# Patient Record
Sex: Male | Born: 1935 | Race: Black or African American | Hispanic: No | Marital: Married | Smoking: Former smoker
Health system: Southern US, Community
[De-identification: ages and names within clinical notes are randomized; demographics above are authoritative.]

## PROBLEM LIST (undated history)

## (undated) DIAGNOSIS — I739 Peripheral vascular disease, unspecified: Secondary | ICD-10-CM

## (undated) DIAGNOSIS — K529 Noninfective gastroenteritis and colitis, unspecified: Secondary | ICD-10-CM

## (undated) DIAGNOSIS — S98112A Complete traumatic amputation of left great toe, initial encounter: Secondary | ICD-10-CM

## (undated) DIAGNOSIS — E785 Hyperlipidemia, unspecified: Secondary | ICD-10-CM

## (undated) DIAGNOSIS — N400 Enlarged prostate without lower urinary tract symptoms: Secondary | ICD-10-CM

## (undated) DIAGNOSIS — R3915 Urgency of urination: Secondary | ICD-10-CM

## (undated) DIAGNOSIS — Z8619 Personal history of other infectious and parasitic diseases: Secondary | ICD-10-CM

## (undated) DIAGNOSIS — M1990 Unspecified osteoarthritis, unspecified site: Secondary | ICD-10-CM

## (undated) DIAGNOSIS — I2510 Atherosclerotic heart disease of native coronary artery without angina pectoris: Secondary | ICD-10-CM

## (undated) DIAGNOSIS — G4700 Insomnia, unspecified: Secondary | ICD-10-CM

## (undated) DIAGNOSIS — Z860100 Personal history of colon polyps, unspecified: Secondary | ICD-10-CM

## (undated) DIAGNOSIS — K2970 Gastritis, unspecified, without bleeding: Secondary | ICD-10-CM

## (undated) DIAGNOSIS — F99 Mental disorder, not otherwise specified: Secondary | ICD-10-CM

## (undated) DIAGNOSIS — I10 Essential (primary) hypertension: Secondary | ICD-10-CM

## (undated) DIAGNOSIS — R002 Palpitations: Secondary | ICD-10-CM

## (undated) DIAGNOSIS — H5440 Blindness, one eye, unspecified eye: Secondary | ICD-10-CM

## (undated) DIAGNOSIS — R233 Spontaneous ecchymoses: Secondary | ICD-10-CM

## (undated) DIAGNOSIS — R238 Other skin changes: Secondary | ICD-10-CM

## (undated) DIAGNOSIS — R011 Cardiac murmur, unspecified: Secondary | ICD-10-CM

## (undated) HISTORY — DX: Palpitations: R00.2

## (undated) HISTORY — PX: CARDIAC CATHETERIZATION: SHX172

## (undated) HISTORY — DX: Hyperlipidemia, unspecified: E78.5

## (undated) HISTORY — PX: COLONOSCOPY: SHX174

## (undated) HISTORY — DX: Essential (primary) hypertension: I10

## (undated) HISTORY — PX: CORONARY ARTERY BYPASS GRAFT: SHX141

## (undated) HISTORY — DX: Peripheral vascular disease, unspecified: I73.9

## (undated) HISTORY — DX: Unspecified osteoarthritis, unspecified site: M19.90

## (undated) HISTORY — PX: CORONARY ANGIOPLASTY: SHX604

## (undated) HISTORY — DX: Gastritis, unspecified, without bleeding: K29.70

## (undated) HISTORY — PX: CATARACT EXTRACTION: SUR2

## (undated) HISTORY — DX: Complete traumatic amputation of left great toe, initial encounter: S98.112A

## (undated) HISTORY — DX: Benign prostatic hyperplasia without lower urinary tract symptoms: N40.0

## (undated) HISTORY — DX: Atherosclerotic heart disease of native coronary artery without angina pectoris: I25.10

---

## 1958-02-24 HISTORY — PX: TONSILLECTOMY: SUR1361

## 1958-10-26 HISTORY — PX: BAND HEMORRHOIDECTOMY: SHX1213

## 1997-10-10 ENCOUNTER — Ambulatory Visit: Admission: RE | Admit: 1997-10-10 | Discharge: 1997-10-10 | Payer: Self-pay | Admitting: Cardiovascular Disease

## 1998-01-03 ENCOUNTER — Ambulatory Visit (HOSPITAL_COMMUNITY): Admission: RE | Admit: 1998-01-03 | Discharge: 1998-01-03 | Payer: Self-pay | Admitting: *Deleted

## 1998-02-24 HISTORY — PX: CORONARY ANGIOPLASTY WITH STENT PLACEMENT: SHX49

## 1998-08-20 ENCOUNTER — Observation Stay (HOSPITAL_COMMUNITY): Admission: AD | Admit: 1998-08-20 | Discharge: 1998-08-21 | Payer: Self-pay | Admitting: Cardiology

## 1999-04-09 ENCOUNTER — Observation Stay (HOSPITAL_COMMUNITY): Admission: RE | Admit: 1999-04-09 | Discharge: 1999-04-10 | Payer: Self-pay | Admitting: Cardiovascular Disease

## 1999-04-09 ENCOUNTER — Encounter: Payer: Self-pay | Admitting: Cardiovascular Disease

## 1999-07-05 ENCOUNTER — Ambulatory Visit (HOSPITAL_COMMUNITY): Admission: RE | Admit: 1999-07-05 | Discharge: 1999-07-05 | Payer: Self-pay | Admitting: *Deleted

## 1999-07-05 ENCOUNTER — Encounter (INDEPENDENT_AMBULATORY_CARE_PROVIDER_SITE_OTHER): Payer: Self-pay | Admitting: *Deleted

## 2000-04-16 ENCOUNTER — Encounter: Admission: RE | Admit: 2000-04-16 | Discharge: 2000-05-26 | Payer: Self-pay | Admitting: Orthopaedic Surgery

## 2000-06-10 ENCOUNTER — Encounter: Payer: Self-pay | Admitting: *Deleted

## 2000-06-10 ENCOUNTER — Ambulatory Visit (HOSPITAL_COMMUNITY): Admission: RE | Admit: 2000-06-10 | Discharge: 2000-06-10 | Payer: Self-pay | Admitting: *Deleted

## 2000-06-15 ENCOUNTER — Encounter: Admission: RE | Admit: 2000-06-15 | Discharge: 2000-06-15 | Payer: Self-pay | Admitting: *Deleted

## 2000-06-15 ENCOUNTER — Encounter: Payer: Self-pay | Admitting: *Deleted

## 2000-06-19 ENCOUNTER — Ambulatory Visit (HOSPITAL_COMMUNITY): Admission: RE | Admit: 2000-06-19 | Discharge: 2000-06-19 | Payer: Self-pay | Admitting: *Deleted

## 2000-06-19 ENCOUNTER — Encounter: Payer: Self-pay | Admitting: *Deleted

## 2000-06-26 ENCOUNTER — Ambulatory Visit (HOSPITAL_COMMUNITY): Admission: RE | Admit: 2000-06-26 | Discharge: 2000-06-26 | Payer: Self-pay | Admitting: *Deleted

## 2000-07-21 ENCOUNTER — Encounter: Payer: Self-pay | Admitting: Cardiology

## 2000-07-21 ENCOUNTER — Ambulatory Visit (HOSPITAL_COMMUNITY): Admission: RE | Admit: 2000-07-21 | Discharge: 2000-07-22 | Payer: Self-pay | Admitting: Cardiology

## 2001-01-14 ENCOUNTER — Ambulatory Visit (HOSPITAL_COMMUNITY): Admission: RE | Admit: 2001-01-14 | Discharge: 2001-01-14 | Payer: Self-pay | Admitting: *Deleted

## 2001-09-24 HISTORY — PX: OSTECTOMY: SHX1017

## 2001-10-13 ENCOUNTER — Encounter: Payer: Self-pay | Admitting: Orthopedic Surgery

## 2001-10-14 ENCOUNTER — Ambulatory Visit (HOSPITAL_COMMUNITY): Admission: RE | Admit: 2001-10-14 | Discharge: 2001-10-16 | Payer: Self-pay | Admitting: Orthopedic Surgery

## 2001-12-20 ENCOUNTER — Ambulatory Visit (HOSPITAL_COMMUNITY): Admission: RE | Admit: 2001-12-20 | Discharge: 2001-12-20 | Payer: Self-pay | Admitting: *Deleted

## 2002-07-14 ENCOUNTER — Ambulatory Visit (HOSPITAL_COMMUNITY): Admission: RE | Admit: 2002-07-14 | Discharge: 2002-07-14 | Payer: Self-pay | Admitting: *Deleted

## 2002-07-14 ENCOUNTER — Encounter: Payer: Self-pay | Admitting: *Deleted

## 2003-05-29 ENCOUNTER — Ambulatory Visit (HOSPITAL_COMMUNITY): Admission: RE | Admit: 2003-05-29 | Discharge: 2003-05-29 | Payer: Self-pay | Admitting: Cardiology

## 2003-06-25 HISTORY — PX: AORTIC VALVE REPLACEMENT: SHX41

## 2003-06-25 HISTORY — PX: CARDIAC VALVE REPLACEMENT: SHX585

## 2003-07-19 ENCOUNTER — Encounter (INDEPENDENT_AMBULATORY_CARE_PROVIDER_SITE_OTHER): Payer: Self-pay | Admitting: Specialist

## 2003-07-19 ENCOUNTER — Inpatient Hospital Stay (HOSPITAL_COMMUNITY): Admission: RE | Admit: 2003-07-19 | Discharge: 2003-07-24 | Payer: Self-pay | Admitting: Surgery

## 2003-08-08 ENCOUNTER — Encounter: Admission: RE | Admit: 2003-08-08 | Discharge: 2003-08-08 | Payer: Self-pay | Admitting: Surgery

## 2003-08-14 ENCOUNTER — Encounter (HOSPITAL_COMMUNITY): Admission: RE | Admit: 2003-08-14 | Discharge: 2003-11-12 | Payer: Self-pay | Admitting: Cardiology

## 2004-02-22 ENCOUNTER — Ambulatory Visit (HOSPITAL_BASED_OUTPATIENT_CLINIC_OR_DEPARTMENT_OTHER): Admission: RE | Admit: 2004-02-22 | Discharge: 2004-02-22 | Payer: Self-pay | Admitting: Cardiology

## 2004-02-22 ENCOUNTER — Ambulatory Visit: Payer: Self-pay | Admitting: Internal Medicine

## 2004-02-29 ENCOUNTER — Encounter: Admission: RE | Admit: 2004-02-29 | Discharge: 2004-02-29 | Payer: Self-pay | Admitting: Cardiovascular Disease

## 2004-03-07 ENCOUNTER — Ambulatory Visit (HOSPITAL_COMMUNITY): Admission: RE | Admit: 2004-03-07 | Discharge: 2004-03-08 | Payer: Self-pay | Admitting: Cardiovascular Disease

## 2004-05-01 ENCOUNTER — Ambulatory Visit (HOSPITAL_COMMUNITY): Admission: RE | Admit: 2004-05-01 | Discharge: 2004-05-01 | Payer: Self-pay | Admitting: *Deleted

## 2004-05-01 ENCOUNTER — Encounter (INDEPENDENT_AMBULATORY_CARE_PROVIDER_SITE_OTHER): Payer: Self-pay | Admitting: Specialist

## 2006-05-02 ENCOUNTER — Emergency Department (HOSPITAL_COMMUNITY): Admission: EM | Admit: 2006-05-02 | Discharge: 2006-05-03 | Payer: Self-pay | Admitting: *Deleted

## 2006-12-28 ENCOUNTER — Encounter: Admission: RE | Admit: 2006-12-28 | Discharge: 2006-12-28 | Payer: Self-pay | Admitting: Cardiovascular Disease

## 2007-01-04 ENCOUNTER — Ambulatory Visit (HOSPITAL_COMMUNITY): Admission: RE | Admit: 2007-01-04 | Discharge: 2007-01-04 | Payer: Self-pay | Admitting: Physician Assistant

## 2007-02-17 ENCOUNTER — Encounter: Admission: RE | Admit: 2007-02-17 | Discharge: 2007-02-17 | Payer: Self-pay | Admitting: Gastroenterology

## 2007-03-01 ENCOUNTER — Ambulatory Visit (HOSPITAL_COMMUNITY): Admission: RE | Admit: 2007-03-01 | Discharge: 2007-03-01 | Payer: Self-pay | Admitting: Cardiovascular Disease

## 2007-03-09 ENCOUNTER — Ambulatory Visit (HOSPITAL_COMMUNITY): Admission: RE | Admit: 2007-03-09 | Discharge: 2007-03-09 | Payer: Self-pay | Admitting: Cardiovascular Disease

## 2007-03-26 ENCOUNTER — Encounter: Admission: RE | Admit: 2007-03-26 | Discharge: 2007-03-26 | Payer: Self-pay | Admitting: Gastroenterology

## 2007-03-30 ENCOUNTER — Ambulatory Visit (HOSPITAL_COMMUNITY): Admission: RE | Admit: 2007-03-30 | Discharge: 2007-03-30 | Payer: Self-pay | Admitting: Gastroenterology

## 2007-08-21 ENCOUNTER — Emergency Department (HOSPITAL_COMMUNITY): Admission: EM | Admit: 2007-08-21 | Discharge: 2007-08-21 | Payer: Self-pay | Admitting: Family Medicine

## 2008-08-30 ENCOUNTER — Ambulatory Visit: Payer: Self-pay | Admitting: Vascular Surgery

## 2008-09-13 ENCOUNTER — Encounter: Admission: RE | Admit: 2008-09-13 | Discharge: 2008-09-13 | Payer: Self-pay | Admitting: Vascular Surgery

## 2008-09-13 ENCOUNTER — Ambulatory Visit: Payer: Self-pay | Admitting: Vascular Surgery

## 2009-03-20 ENCOUNTER — Ambulatory Visit: Payer: Self-pay | Admitting: Vascular Surgery

## 2009-03-21 ENCOUNTER — Ambulatory Visit: Payer: Self-pay | Admitting: Vascular Surgery

## 2009-10-25 ENCOUNTER — Ambulatory Visit: Payer: Self-pay | Admitting: Vascular Surgery

## 2010-05-09 ENCOUNTER — Ambulatory Visit (INDEPENDENT_AMBULATORY_CARE_PROVIDER_SITE_OTHER): Payer: Medicare Other | Admitting: Vascular Surgery

## 2010-05-09 ENCOUNTER — Encounter (INDEPENDENT_AMBULATORY_CARE_PROVIDER_SITE_OTHER): Payer: Medicare Other

## 2010-05-09 DIAGNOSIS — I739 Peripheral vascular disease, unspecified: Secondary | ICD-10-CM

## 2010-05-09 DIAGNOSIS — I70219 Atherosclerosis of native arteries of extremities with intermittent claudication, unspecified extremity: Secondary | ICD-10-CM

## 2010-05-10 NOTE — Assessment & Plan Note (Signed)
OFFICE VISIT  [name], [name] DOB:  [birth year]                                       05/09/2010 CHART#:[id number]  HISTORY OF PRESENT ILLNESS:  The patient today returns for followup.  He is an 77 year old male with lower extremity claudication symptoms.  He was last seen in September of 2011.  He states that currently he is able to walk approximately 50-75 yards before experiencing left-sided calf pain.  He is also unstable on his legs at that point and usually uses a shopping cart to support himself if he is going to be standing for long periods of time.  He continues to deny any rest pain.  He has not had any problems healing up ulcerations on his feet.  Overall he reports that he is in fairly good health.  CHRONIC MEDICAL PROBLEMS:  Include diabetes, hypertension and elevated cholesterol, all of these are currently controlled.  He is followed by Dr. Garnette Scheuermann for his cardiology issues related to underlying coronary disease.  He has previously had lower extremity vein harvesting from coronary artery bypass grafting.  Of note, he also previously has been intolerant to Plavix because this causes GI bleeding and this was a good portion of the reason why he was not interested in considering lower extremity stenting in the past that this would most likely require him to be on Plavix for at least a short period of time.  REVIEW OF SYSTEMS:  Today he denies any shortness of breath or chest pain.  PHYSICAL EXAM:  Vital signs:  Blood pressure is 117/55 in the left arm, heart rate is 83 and regular, oxygen saturation 97%, respirations 16. HEENT:  Unremarkable.  Neck:  Has 2+ carotid pulses without bruit. Chest:  Clear to auscultation.  Cardiac:  Exam is regular rate and rhythm without murmur.  Abdomen:  Soft, nontender, nondistended.  No masses.  Extremities:  He has nonpalpable femoral, popliteal and pedal pulses bilaterally.  Neurological:  He has symmetric  upper extremity and lower extremity motor strength.  Skin:  Has no open ulcers or rashes.  I had a lengthy discussion with the patient today and he seems at this point to be fairly frustrated with his current walking distance.  I did discuss with him the possibility of angioplasty and stenting in his lower extremities but did also discuss with him that this probably would require Plavix at least for a short period of time and he was not interested in taking Plavix at all.  The other option would be the possibility of doing a lower extremity bypass procedure to improve his symptoms.  However, he does have limited or minimal autologous conduit. At this point he has agreed to a diagnostic arteriogram to see what his options may be.  If he decides he wishes to have a bypass performed we will have him reviewed again by Dr. Katrinka Blazing preoperatively for risk stratification.  His arteriogram is scheduled for 05/24/2010.  Risks, benefits, possible complications and procedure details including but not limited to bleeding, infection, contrast reaction were explained to the patient today.  He understands and agrees to proceed.    Janetta Hora. Fields, MD Electronically Signed  CEF/MEDQ  D:  05/10/2010  T:  05/10/2010  Job:  [id number]

## 2010-05-24 ENCOUNTER — Ambulatory Visit (HOSPITAL_COMMUNITY): Payer: Medicare Other

## 2010-05-24 ENCOUNTER — Ambulatory Visit (HOSPITAL_COMMUNITY)
Admission: RE | Admit: 2010-05-24 | Discharge: 2010-05-24 | Disposition: A | Discharge status: Home / Self Care | Payer: Medicare Other | Source: Ambulatory Visit | Attending: Vascular Surgery | Admitting: Vascular Surgery

## 2010-05-24 DIAGNOSIS — I739 Peripheral vascular disease, unspecified: Secondary | ICD-10-CM | POA: Insufficient documentation

## 2010-05-24 DIAGNOSIS — I70219 Atherosclerosis of native arteries of extremities with intermittent claudication, unspecified extremity: Secondary | ICD-10-CM

## 2010-05-24 DIAGNOSIS — Z01810 Encounter for preprocedural cardiovascular examination: Secondary | ICD-10-CM | POA: Insufficient documentation

## 2010-05-24 LAB — POCT I-STAT, CHEM 8
Calcium, Ion: 1.14 mmol/L (ref 1.12–1.32)
Chloride: 109 mEq/L (ref 96–112)
Hemoglobin: 12.9 g/dL — ABNORMAL LOW (ref 13.0–17.0)
TCO2: 25 mmol/L (ref 0–100)

## 2010-05-24 LAB — GLUCOSE, CAPILLARY: Glucose-Capillary: 104 mg/dL — ABNORMAL HIGH (ref 70–99)

## 2010-06-03 NOTE — Op Note (Signed)
  [name], [name]                ACCOUNT NO.:  [id number]  MEDICAL RECORD NO.:  [id number]           PATIENT TYPE:  O  LOCATION:  [facility]SC                         FACILITY:  [facility]  PHYSICIAN:  Mark Hora. Janelys Glassner, MD  DATE OF BIRTH:  [birth year]  DATE OF PROCEDURE:  05/24/2010 DATE OF DISCHARGE:                              OPERATIVE REPORT   PROCEDURE:  Attempted aortogram via left and right femoral puncture.  PREOPERATIVE DIAGNOSIS:  Claudication, bilateral lower extremities.  POSTOPERATIVE DIAGNOSIS:  Claudication, bilateral lower extremities.  ANESTHESIA:  Local.  OPERATIVE DETAILS:  After obtaining informed consent, the patient was taken to the [facility] Lab.  The patient was placed in supine position on the angio table.  Both groins were prepped and draped in the usual sterile fashion.  Local anesthesia was infiltrated over the area of the right common femoral artery.  An introducer needle was then used to cannulate the right common femoral artery.  There was some backbleeding from this, however, it was fairly diminutive.  An attempt was made to pass a 0.035 Versacore wire into the needle, but this would not advance into the artery.  This was observed under fluoroscopy and the femoral artery was heavily calcified at the femoral bifurcation, SFA, profunda femoris, and common femoral artery.  Ultrasound was used to identify the right common femoral artery and there seemed to be a channel lumen within this, so additional attempt was made to cannulate this under ultrasound guidance and again this was unsuccessful.  At this point, attention was turned to the patient's left groin.  Local anesthesia was infiltrated over the left common femoral artery.  There was a pulse within the artery using ultrasound guidance.  There also appeared to be a channel lumen within this.  However, the artery was also heavily calcified.  Introducer needle was then passed several times to try to cannulate the  left common femoral artery.  There again was some backbleeding from this, but no pulsatile flow.  The guidewire could not be advanced.  At this point, attempts to cannulate the groin vessels were abandoned.  The patient will be scheduled for a CT angiogram later today to further define his aortoiliac system.  The patient tolerated the procedure well and there were no complications.  Hemostasis was obtained with direct pressure on both groins for several minutes.  The patient was taken to the holding area in stable condition.  No contrast was administered.     Mark Hora. Hazelgrace Bonham, MD     CEF/MEDQ  D:  05/24/2010  T:  05/25/2010  Job:  [id number]  Electronically Signed by Fabienne Bruns MD on 06/03/2010 07:50:07 AM

## 2010-07-05 ENCOUNTER — Encounter: Payer: Self-pay | Admitting: Vascular Surgery

## 2010-07-09 NOTE — Cardiovascular Report (Signed)
NAMETHEORDORE, Mark NO.:  [id number]   MEDICAL RECORD NO.:  [id number]          PATIENT TYPE:  INP   LOCATION:  2854                         FACILITY:  [facility]   PHYSICIAN:  Nanetta Batty, M.D.   DATE OF BIRTH:  1924/06/29   DATE OF PROCEDURE:  DATE OF DISCHARGE:                            CARDIAC CATHETERIZATION   [name] is an 77 year old African American male with history of CAD  and PVOD.  He has a history of bypass grafting and aortic valve  replacement with a pericardial valve..  He has hypertension,  hyperlipidemia and PVOD, status post right common iliac artery  __________  and stenting by myself on March 07, 2004.  At that time he  was found to have a 40% left iliac artery lesion and totally occluded  SFA's bilaterally.  He presents now for angiography because of  progressive lifestyle-limiting claudication.   DESCRIPTION OF PROCEDURE:  Patient is brought to the second floor [facility] angiographic suite in the postabsorptive state.He was premedicated  for contrast allergy with Solu-Medrol, Benadryl and __________ Both  groins are prepped and shaved in usual sterile fashion.  One percent  Xylocaine was used for local anesthesia.  Attempts were made to access  his common femoral arteries bilaterally __________ needle under  fluoroscopic visual control because of dense calcification.  I could  feel a calcification with attempted puncture of the femoral arteries and  was able to get blood flow back bilaterally; however, was unable to pass  a wire through to the calcification.  I felt uncomfortable being  aggressive in this location given the extent of calcification and the  probable difficulty placing a sheath.  The procedure was terminated and  plans will be to observe his groins for 6 hours in the recumbent  position and send him home.  We will get a CT scan angiogram to define  his anatomy as an outpatient and I will review this make further  recommendations based on this.  I suspect that at this point, he is not  a percutaneous revascularization candidate unless this is done via the  brachial approach for iliac disease.  Pressure was held on each groin to  obtain hemostasis.  Patient was left the room in stable condition.      Nanetta Batty, M.D.  Electronically Signed     JB/MEDQ  D:  03/01/2007  T:  03/01/2007  Job:  [id number]   cc:   Saddie Benders, D.D.S.  Fleet Contras, M.D.

## 2010-07-09 NOTE — Procedures (Signed)
CAROTID DUPLEX EXAM   INDICATION:  Bilateral carotid bruit.   HISTORY:  Diabetes:  Borderline.  Cardiac:  Coronary artery bypass graft x5 in 1989, x2 in 2000.  Hypertension:  Borderline.  Smoking:  Remote history of tobacco use, quit when he was 15.  Previous Surgery:  No.  CV History:  No.  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  Amaurosis Fugax No, Paresthesias , Hemiparesis No                                       RIGHT             LEFT  Brachial systolic pressure:         164               158  Brachial Doppler waveforms:         Triphasic         Triphasic  Vertebral direction of flow:        Antegrade         Antegrade  DUPLEX VELOCITIES (cm/sec)  CCA peak systolic                   86                82  ECA peak systolic                   85                96  ICA peak systolic                   96                66  ICA end diastolic                   29                20  PLAQUE MORPHOLOGY:                  Calcified         Calcified  PLAQUE AMOUNT:                      Mild              Mild to moderate  PLAQUE LOCATION:                    Proximal ICA      CCA, proximal ICA   IMPRESSION:  20-39% ICA stenosis bilaterally.   ___________________________________________  Janetta Hora Fields, MD   MC/MEDQ  D:  09/13/2008  T:  09/13/2008  Job:  [id number]

## 2010-07-09 NOTE — Assessment & Plan Note (Signed)
OFFICE VISIT   [name], [name]  DOB:  10-12-1924                                       10/25/2009  CHART#:[id number]   HISTORY OF PRESENT ILLNESS:  The patient is an 77 year old male that I  have followed chronically for lower extremity claudication symptoms.  He  continues to be able to walk approximately 30 minutes before  experiencing symptoms in his legs.  He is able to walk for extended  periods of time if he uses a shopping cart for support.  He states that  his left leg is worse than his right.  He states that sometimes he is  only able to walk half a block without support.  He thinks that overall  his symptoms may be slightly worse than his last office visit which was  in January of 2011.  He continues to deny any rest pain.  He has no  ulcers on his feet.   Chronic medical problems include diabetes, hypertension, elevated  cholesterol, all of which are currently controlled.   PAST SURGICAL HISTORY:  Coronary artery bypass grafting requiring  saphenous vein from both legs.   SOCIAL HISTORY:  He is retired.  He is married.  He is a former smoker  but quit in 1938.  He does not consume alcohol regularly.   REVIEW OF SYSTEMS:  He denies any shortness of breath or chest pain.  Lower extremity vascular symptoms are as described above.   PHYSICAL EXAM:  Vital signs:  Blood pressure is 154/54 in the left arm,  heart rate is 80 and regular.  Temperature is 97.9.  HEENT:  Unremarkable.  Neck:  Has 2+ carotid pulses without bruit.  Chest:  Clear to auscultation.  Cardiac:  Regular rate and rhythm without  murmur.  Abdomen:  Soft, nontender, nondistended.  No masses.  Extremities:  He has absent femoral, popliteal and pedal pulses  bilaterally.  His feet are warm and pink with brisk capillary refill.  He has no open ulcers or rashes.   Review of his previous CT scans shows that he has a right common femoral  artery occlusion with reconstitution of the  profunda.  He also has some  mild to moderate in-stent stenosis of his left iliac stent.   I had a lengthy discussion with the patient today about his claudication  symptoms.  I did reassure him that he is currently not at risk of limb  loss.  His ABI on the left side was 0.41, the right side was 0.57.  These are similar to previous ABIs.  I did offer him the possibility of  an intervention for his left leg that would most likely be in the form  of a bypass.  However, I did discuss with him that this would most  likely require prosthetic with limited durability compared to vein.  He  understands and will currently think about whether or not he wishes to  have an intervention in his lower extremities.  He will call if he  wishes to have something done in the near future.  Otherwise he will  follow up in six months' time with repeat ABIs.     Janetta Hora. Fields, MD  Electronically Signed   CEF/MEDQ  D:  10/25/2009  T:  10/26/2009  Job:  [id number]   cc:   Lindaann Slough, M.D.  Fleet Contras, M.D.  Lyn Records, M.D.

## 2010-07-09 NOTE — Assessment & Plan Note (Signed)
OFFICE VISIT   [name], [name]  DOB:  26-Sep-1924                                       09/13/2008  CHART#:[id number]   The patient returns for followup today.  He had a CT angiogram earlier  today as well as a carotid duplex exam and returns today to discuss the  results of this.  He was last seen on 08/30/2008.  At that time he was  complaining of claudication symptoms occurring at one half to one block  with the left leg being worse than the right.  He continues to state  similar symptoms.  He denies any rest pain.   PHYSICAL EXAMINATION:  On physical exam today the feet are pink, warm  and well-perfused.  He has no significant change overall.  Blood  pressure was 147/68, pulse 81 and regular.   CT angiogram shows several areas of atherosclerotic occlusive disease.  He has evidence of possible renal artery stenosis but his blood pressure  is well-controlled and his creatinine is normal.  I do not think this  needs to be addressed currently.   As far as his lower extremity occlusive disease is concerned he has a  right common iliac stent with mild in-stent restenosis.  He has a  chronic occlusion of his right common femoral and superficial femoral  artery.  He has reconstitution of his right below knee popliteal artery  with two vessel runoff to the right foot via the peroneal and posterior  tibial artery.   On his left leg the left iliac artery is patent.  The left superficial  femoral artery is occluded with reconstitution of the below knee  popliteal artery and two vessel runoff via the posterior tibial and  peroneal arteries.  His most recent ABIs were 0.43 on the left and 0.57  on the right.   His carotid duplex exam today showed less than 40% stenosis bilaterally.   I had a lengthy discussion with the patient today concerning his  occlusive disease in both lower extremities.  His left leg is the most  symptomatic.  However, he does not  currently have rest pain or tissue  loss.  To reconstruct his left lower extremity this would most likely  require a left femoral to below knee popliteal bypass and possible left  femoral endarterectomy.  To reconstruct his right side he most likely  would require left to right femoral-femoral bypass in addition to a  femoral-popliteal bypass and a right femoral endarterectomy.  In  discussing this with the patient he has decided at this point that his  symptoms are only mildly disabling and he will continue a walking  program and his Pletal for now.  If his symptoms become more severe or  he develops rest pain or tissue loss we would consider revascularization  at that time.  He will follow up with repeat ABIs in 6 months.   Janetta Hora. Fields, MD  Electronically Signed   CEF/MEDQ  D:  09/14/2008  T:  09/14/2008  Job:  [id number]   cc:   Fleet Contras, M.D.  Lindaann Slough, M.D.  Madaline Savage, M.D.

## 2010-07-09 NOTE — Assessment & Plan Note (Signed)
OFFICE VISIT   [name], [name]  DOB:  07/11/1924                                       08/30/2008  CHART#:[id number]   The patient is an 77 year old male referred by Dr. Brunilda Payor for evaluation  of lower extremity claudication symptoms.  He previously had a right  common iliac artery stent placed by Dr. Allyson Sabal in 2008.  This was done  for claudication symptoms.  He states that his symptoms were improved  somewhat from that but he still has claudication symptoms that occur at  one half to one block.  Left leg worse than the right.  He also  complains of burning in his feet when he wears tight shoes.  He has some  relief in both lower extremities with wearing compression stockings.   Risk factors include age, coronary artery disease, diabetes,  hypertension and elevated cholesterol.   PAST SURGICAL HISTORY:  Coronary bypass grafting and has also had PTCA  with stenting.  He also has had tonsillectomy.   MEDICATIONS:  1. Lescol XL 80 mg q.h.s.  2. Metoprolol ER 25 mg 1 tablet three times a day.  3. Cilostazol 100 mg twice a day.  4. Zetia 10 mg once a day.  5. Ramipril 5 mg one a day.  6. Aspirin 325 mg once a day.  7. Tamsulosin 0.4 mg q.h.s.  8. Zolpidem 10 mg q.h.s.   ALLERGIES:  He has no known drug allergies.   FAMILY HISTORY:  Unremarkable.   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  He is a nonsmoker but did smoke for a few years when he  was a teenager.   REVIEW OF SYSTEMS:  CONSTITUTIONAL:  He is 5 feet 5 inches.  CARDIAC:  He has a history of palpitations and atrial fibrillation which  is followed by Dr. Elsie Lincoln.  ORTHOPEDIC:  He has multiple joint arthritis and muscle pain.  Pulmonary, GI, renal, vascular, neurologic, psychiatric, ENT and  hematologic review of systems otherwise negative.   PHYSICAL EXAM:  Vital signs:  Blood pressure is 139/67 in the left arm,  pulse is 73 and regular.  HEENT:   Unremarkable.  Neck:  Has bilateral  carotid bruits and 2+ carotid pulses.  Chest:  Clear to auscultation.  Cardiac:  Exam is regular rate and rhythm with a 3/6 systolic murmur.  Abdomen:  Soft, nontender with no pulsatile mass.  Extremities:  He has  2+ brachial and radial pulses bilaterally.  He has 1+ femoral pulses  bilaterally.  He has absent popliteal and pedal pulses bilaterally.  He  has no edema or ulcerations in the lower extremities.   He had bilateral ABIs performed on 08/30/2008 which were 0.57 on the  right and 0.43 on the left.   I had a lengthy discussion with the patient today regarding possible  treatment options of his lower extremity claudication symptoms.  Most  likely he has bilateral superficial femoral artery occlusive disease  which was previously documented by Dr. Allyson Sabal.  I do not believe that he  has had significant recurrence of his iliac artery stenosis.  Treatment  options currently would include conservative measures with walking and  continued medical management with Pletal and risk factor modification.  Other potential treatment plan would be arteriogram with possible  angioplasty  and stenting of his superficial femoral arteries.  However,  the SFA occlusions described previously seem fairly lengthy and probably  would not be amendable to percutaneous revascularization.  Other  possibilities would be an open revascularization procedure.  He is  currently not interested in an open operation as he does not feel like  his symptoms are debilitating enough to consider the risks associated  with an open operation.  I believe the best option for the patient at  this point would be a CT angiogram of lower extremity runoff so that we  can further define his arterial tree to see what other treatment options  might be best for him and also that we have a baseline exam.  In  addition, we will obtain a carotid duplex exam since he does have  bilateral carotid bruits  and has not had a carotid ultrasound in several  years.  He will follow up with me in 2 weeks' time to discuss further  those treatment options.   [name]. Fields, MD  Electronically Signed   CEF/MEDQ  D:  08/30/2008  T:  08/31/2008  Job:  [id number]   cc:   Fleet Contras, M.D.  Lindaann Slough, M.D.  Madaline Savage, M.D.

## 2010-07-09 NOTE — Assessment & Plan Note (Signed)
OFFICE VISIT   [name], [name]  DOB:  03-May-1924                                       03/21/2009  CHART#:[id number]   The patient returns for followup today.  He was last seen in July of  2010 with complaints of lower extremity claudication.  At that time he  was walking 1/2 block before experiencing claudication symptoms in his  left leg.  He denied any rest pain at that time.  He returned to the  office today after repeat ABIs yesterday showed no flow to his left  lower extremity.   However, clinically he is basically the same as his July office visit.  He continues to ride a stationary bike 1-1/2 to 2 hours per day.  He  walks 1-1/2 miles at [place] with the support of a shopping cart.  Overall he feels well and has had no significant change in his clinical  condition since July of 2010.  He denies any rest pain.  He has had no  problems with nonhealing ulcers on his feet.   REVIEW OF SYSTEMS:  He denies any chest pain or shortness of breath.   MEDICATIONS:  Are unchanged.  He has changed cardiologists to Dr. Garnette Scheuermann since the retirement of Dr. Elsie Lincoln.   PHYSICAL EXAM:  Blood pressure is 133/62 in the left arm, heart rate is  75 and regular.  Temperature is 97.8.  Lower extremity exam shows absent  femoral, popliteal and pedal pulses bilaterally.  Feet are pink, warm  and well-perfused with no ulcerations.   We repeated his ABIs today and most likely this was lab error yesterday.  The ABI on the left side was 0 yesterday.  Today it is 0.41 which is  similar to what it was in July.  ABI on the right side was 0.46 which is  also similar to July.   Overall the patient's clinical condition is unchanged.  Again, he has no  saphenous vein due to previous harvest and to do a bypass for  claudication symptoms alone I do not believe would be in his best  interest at this time.  He agrees and feels that he is not really  debilitated by his current  symptoms.  He will continue to walk and ride  his bike daily.  If his symptoms become worse over time or become more  disabling he will return for followup sooner.  Otherwise we will see him  again in six months' time for repeat ABIs.     Janetta Hora. Fields, MD  Electronically Signed   CEF/MEDQ  D:  03/21/2009  T:  03/22/2009  Job:  [id number]   cc:   Lyn Records, M.D.  Fleet Contras, M.D.  Lindaann Slough, M.D.

## 2010-07-12 NOTE — Op Note (Signed)
NAME:  [name], [name]                          ACCOUNT NO.:  [id number]   MEDICAL RECORD NO.:  [id number]                   PATIENT TYPE:  INP   LOCATION:  2306                                 FACILITY:  [facility]   PHYSICIAN:  Evelene Croon, M.D.                  DATE OF BIRTH:  [birth year]   DATE OF PROCEDURE:  07/19/2003  DATE OF DISCHARGE:                                 OPERATIVE REPORT   PREOPERATIVE DIAGNOSIS:  Severe native three-vessel coronary artery disease  and saphenous vein graft disease, status post coronary artery bypass graft  surgery in 1989.  Severe aortic stenosis.   POSTOPERATIVE DIAGNOSIS:  Severe native three-vessel coronary artery disease  and saphenous vein graft disease, status post coronary artery bypass graft  surgery in 1989.  Severe aortic stenosis.   OPERATION PERFORMED:  Redo median sternotomy, extracorporeal circulation,  redo coronary artery bypass grafting surgery times one using a saphenous  vein graft to the posterior descending coronary artery, and aortic valve  replacement using a 21 mm pericardial valve.  Endoscopic vein harvesting  from the right leg.   SURGEON:  Alleen Borne, M.D.   ASSISTANT:  Jerold Coombe, P.A.   ANESTHESIA:  General endotracheal.   INDICATIONS FOR PROCEDURE:  The patient is a 77 year old gentleman with a  history of coronary disease who underwent coronary artery bypass surgery  times five in 1989 by Dr. Andrey Campanile.  He had a left internal mammary artery  graft to the left anterior descending and a sequential saphenous vein graft  to the intermediate and first and second marginal branches.  He has also had  a saphenous vein graft to the posterior descending artery.  He subsequently  had two stents placed in his right coronary artery and a stent placed in the  midbody of the sequential left circumflex vein graft.  His last percutaneous  intervention on the left circumflex was in May of 2002.  He underwent a  Cardiolite  scan in March of 2005 which showed a moderate-sized inferior and  septal nontransmural myocardial infarction with a moderate amount of  reversible peri-infarct ischemia.  His ejection fraction was 44%.  He had a  several month history of progressive shortness of breath with exertion as  well as mild chest discomfort with exertion.  Cardiac catheterization on  May 29, 2003 was initially performed through the right femoral approach but  was not possible to pass the wire up the right iliac system and therefore  the procedure was performed through the left femoral artery which was  patent.  His right and left heart pressures were within normal limits.  There was severe aortic stenosis with a peak gradient of 57 and a mean  gradient of 50 with a calculated valve area of 0.63 cm squared.  Cardiac  index was 2.6.  Coronary angiography showed severe calcification of the  coronary arteries.  The  left main was occluded at its ostium.  The right  coronary artery was occluded in the midportion of with the recannulized  segment of the vessel still in the distal right coronary artery by  collaterals.  The right coronary artery vein graft was occluded proximally  at its ostium.  A sequential vein graft to the left circumflex was widely  patent throughout including the stented segments.  The left internal mammary  artery graft to the LAD was widely patent.  Left ventricular ejection  fraction was 45 to 50% with a small inferior basal aneurysm.  An  echocardiogram showed normal left ventricular size and function with mild  concentric left ventricular hypertrophy.  The right ventricle was mildly  dilated with mildly reduced systolic function.  There was no mitral stenosis  and mild mitral regurgitation.  There was mild tricuspid regurgitation.  There was severe aortic stenosis with a calcified aortic valve and mild  aortic insufficiency.  After review of the angiogram and echocardiogram and  examination of  the patient, it was felt that the best treatment would be  aortic valve replacement and coronary artery bypass to the right coronary  artery.  I discussed the operative procedure with the patient and his family  including alternatives, benefits, and risks including bleeding, possible  blood transfusion, infection, stroke, myocardial infarction, organ failure  and death.  I also discussed the use of a pericardial tissue valve since he  is 77 years old.  They understood all of this and agreed to proceed.   DESCRIPTION OF PROCEDURE:  The patient was taken to the operating room and  placed on the table in supine position.  After induction of general  endotracheal anesthesia, a Foley catheter was placed in the bladder using  sterile technique.  Then the chest, abdomen and both lower extremities were  prepped and draped in the usual sterile manner.  The transesophageal  echocardiogram was performed.  This showed severe calcific aortic stenosis.  There was mild mitral regurgitation.  There was mild aortic insufficiency.  There was moderate left ventricular hypertrophy with good systolic function.  The right ventricle was mildly dilated and hypokinetic.   The chest was entered through a median sternotomy incision.  The sternum was  divided using an oscillating saw without difficulty.  Bone hooks were used  to retract the sternal edges and the pericardium was then dissected off the  back side of the sternum.  Then the chest retractor was placed.  The  pericardial adhesions were divided to expose the right atrium and ascending  aorta.  The old right coronary vein graft was identified.  The left  circumflex vein graft was identified and carefully preserved.  Then the  patient had a segment of greater saphenous vein harvested from the right leg  using endoscopic vein harvest technique.  This vein was of medium size and  fair quality.  Then the patient was heparinized and when an adequate activated  clotting  time was achieved, the distal ascending aorta was cannulated using a 20  French aortic cannula for arterial inflow.  Venous outflow was achieved  using a two-stage venous cannula for the right atrium.  An antegrade  cardioplegia and vent cannula was inserted in the aortic root.  A retrograde  cardioplegia cannula was inserted through the right atrium into the coronary  sinus.   The patient was placed on cardiopulmonary bypass and the inferior wall was  dissected from the adhesions to expose the posterior descending coronary  artery.  This artery was diffusely diseased but still graftable just beyond  the previous distal anastomosis.  The old right coronary vein graft was  divided at its origin from the aorta to allow exposure of the aortic root.  The distal end of it was oversewn.  Then dissection was performed to expose  the left internal mammary pedicle as it entered the pericardial cavity.  This was a large vessel.  Then the ascending aorta was completely exposed.  The patient was then cooled to 28 degrees centigrade.  Then the aorta was  crossclamped and 500 mL of cold blood antegrade cardioplegia was  administered in the aortic root with quick arrest of the heart.  A clamp was  placed on the mammary pedicle.  This was followed by 500 mL of retrograde  cold blood cardioplegia.  Topical hypothermia with iced saline was used.  Then the distal anastomosis was performed to the posterior descending  coronary artery.  The internal diameter was 1.6 mm.  The conduit used was a  segment of greater saphenous vein.  The anastomosis was performed in a end-  to-side manner using continuous 7-0 Prolene suture.  Flow was measured  through the graft and was excellent.  Then a dose of cardioplegia was given  down this vein graft.   Then attention was turned to aortic valve replacement.  The aorta was opened  transversely about 2 to 3 cm above the takeoff of the right coronary artery.  The  proximal portion of the aortic root below this area was markedly  calcified.  Examination of the native valve showed there was severe  calcification of the leaflets and the annulus.  The native aortic valve was  excised.  Then the annulus was decalcified using rongeurs.  Care was taken  to remove all particulate debris.  The left ventricle and aortic root were  irrigated with iced saline solution.  The annulus was sized and a 21 mm  pericardial Magna valve was chosen.  This had model number 3000, serial  number [id number] and was a 21 mm valve.  Then a series of pledgeted 2-0  Ethibond horizontal mattress sutures were placed around the annulus with the  pledgets in subannular position.  These sutures were placed through a sewing  ring, the valve lowered in place. The sutures were tied sequentially and the  valve seated nicely.  The right coronary ostia was not obstructed.  Then the patient was rewarmed to 37 degrees centigrade.  The aorta was  closed in two layers using continuous 4-0 Prolene suture.  The aortotomy was  coated with Bioglue since the aorta was somewhat thin.  Then the proximal  vein graft anastomosis was performed to the aortic root in an end-to-side  manner using continuous 6-0 Prolene suture.  Then deairing maneuvers were  performed.  The head was placed in a Trendelenburg position.  The clamp was  removed from the mammary pedicle and there was rapid warming of the  ventricular septum and return of spontaneous ventricular fibrillation.  The  cross-clamp was removed with a time of 136 minutes and the patient  spontaneously converted into sinus rhythm.   The proximal and distal anastomoses appeared hemostatic and the line of the  graft satisfactory.  Graft markers were placed around the proximal  anastomosis.  Two temporary right ventricular and right atrial pacing wires  placed and brought out through the skin.   When the patient had rewarmed to 37 degrees centigrade, he was  weaned from  cardiopulmonary bypass on low-dose dopamine.  Total bypass time was 192  minutes.  Cardiac function appeared excellent with a cardiac output of 6L  per minute.  Transesophageal echocardiogram confirmed normal functioning  aortic valve prosthesis without insufficiency or perivalvular leak.  There  was no change in the mild mitral regurgitation and no change in left or  right ventricular function.  Protamine was then given and the venous and  aortic cannulas were removed.  Hemostasis was achieved.  Two chest tubes  were placed with a tube in the posterior pericardium and one in the anterior  mediastinum.  The sternum was closed with #6 stainless steel wires.  The  fascia was closed with continuous #1 Vicryl suture.  The subcutaneous tissue  was closed with continuous 2-0 Vicryl and the skin with 3-0 Vicryl  subcuticular closure.  The lower extremity vein harvest site was closed in  layers in a similar manner.  Sponge, needle and instrument counts were  correct according to the scrub nurse.  Dry sterile dressings were applied  over the incisions and around the chest tubes which were hooked to Pleur-  evac suction.  The patient remained hemodynamically stable and was  transported to the SICU in guarded but stable condition.                                               Evelene Croon, M.D.    BB/MEDQ  D:  07/19/2003  T:  07/20/2003  Job:  [id number]   cc:   Madaline Savage, M.D.  [address], [address]  [place]  [place] [address]  Fax: [phone]   Cardiac cath lab

## 2010-07-12 NOTE — Discharge Summary (Signed)
NAME:  [name], [name]           ACCOUNT NO.:  [id number]   MEDICAL RECORD NO.:  [id number]          PATIENT TYPE:  OUT   LOCATION:  SDS                          FACILITY:  [facility]   PHYSICIAN:  Nanetta Batty, M.D.   DATE OF BIRTH:  May 05, 1924   DATE OF ADMISSION:  01/04/2007  DATE OF DISCHARGE:  01/04/2007                               DISCHARGE SUMMARY   This was a brief visit on January 04, 2007 in short-stay C.  Patient  was brought into the hospital for elective peripheral angiogram  secondary to claudication.  Patient complained of dark stools on arrival  to short-stay.  He had been started on Plavix two weeks prior.  We did a  Hemoccult which was positive for blood.  Repeat CBC dropped from 14 down  to 12.  I called his GI physician, Dr. Bosie Clos, and Dr. Bosie Clos had  him come to his office for endoscopy.  Patient was instructed to stop  his Plavix.  He was given a prescription for Protonix; he was on Zegerid  as an outpatient and Dr. Bosie Clos would decide which one to give the  patient as well.  [name] will see Dr. Allyson Sabal back to reschedule his PV  angio in two weeks; appointment was given.      Darcella Gasman. Annie Paras, N.P.      Nanetta Batty, M.D.  Electronically Signed    LRI/MEDQ  D:  01/09/2007  T:  01/09/2007  Job:  [id number]   cc:   Shirley Friar, MD

## 2010-07-12 NOTE — Op Note (Signed)
NAME:  [name], [name]                          ACCOUNT NO.:  [id number]   MEDICAL RECORD NO.:  [id number]                   PATIENT TYPE:  INP   LOCATION:  2306                                 FACILITY:  [facility]   PHYSICIAN:  Burna Forts, M.D.             DATE OF BIRTH:  1924/04/03   DATE OF PROCEDURE:  07/19/2003  DATE OF DISCHARGE:                                 OPERATIVE REPORT   PROCEDURE PERFORMED:  Intraoperative transesophageal echocardiogram.   INDICATIONS FOR PROCEDURE:  [name] is a 77 year old gentleman who  presents for reoperation, coronary artery bypass grafting and aortic valve  replacement to be performed by Evelene Croon, M.D.   DESCRIPTION OF PROCEDURE:  He was brought to the holding area the morning of  surgery where under local anesthesia with sedation, radial arterial and  pulmonary artery catheters were inserted.  He was then taken to the  operating room for routine induction of general anesthesia after which the  TEE probe was lubricated and passed oropharyngeally into the stomach in  preparation for imaging of the cardiac structures.   PRECARDIOPULMONARY BYPASS EXAMINATION:  Left ventricle.  This is a severely  concentrically hypertrophied left ventricular chamber.  Short axis views  revealed good overall contractility pattern both in short and long axis  views.  Papillary muscles are also thickened but well outlined.   Mitral valve.  Annular calcium is noted around the mitral valve leaflet  area.  The leaflets themselves are thin, compliant, mobile and normal in  form and function.  There is trace mitral regurgitant flow on Doppler  examination.   Aortic valve.  This is a severely thickened leaflet.  The echodensity is so  intense, it is difficult to tell whether this is a free or functionally  bicuspid leaflet valve.  This is consistent with severe aortic stenosis and  there is severe limitation of motion during systolic contraction associated  with nearly total immobility of the valve with a small orifice noted. On  Doppler examination in the long axis views, there is trace aortic  insufficiency and of course, a very stenotic jet about the level of the  aortic valve in the ascending aorta.   Left atrium.  This is a normal left atrial chamber.  The interatrial septum  was intact.   Right ventricle.  Essentially normal chamber in form and function.   Tricuspid valve.  Mild to moderate tricuspid regurgitation is noted.   Right atrium.  Mildly increased right atrial size.   The patient was placed on cardiopulmonary bypass and hypothermia was begun.  The aortic valve was replaced with a #21 tissue valve.  Coronary artery  bypass grafting was carried out times one.  The patient was rewarmed and  separated from cardiopulmonary bypass.   POST CARDIOPULMONARY BYPASS TEE EXAMINATION (LIMITED EXAM):  Left ventricle.  In the early bypass period, this was a paced ventricular chamber.  Overall  contractility remained good with the addition of inotropes.  There does not  appear to be any significant hypo or akinetic areas noted in the post bypass  period.   Aortic valve.  __________  disease, the aortic valve could now be seen.  The  trileaflet tissue valve was placed, appeared to be placed well, seated  appropriately, functioning entirely in a normal fashion.  There was no  obstruction to flow and there was essentially no aortic insufficiency noted.  This is considered to be a good repair.   The rest of the cardiac examination was as previously described.  The  patient was then returned to the cardiac intensive care unit in stable  condition.                                               Burna Forts, M.D.    JTM/MEDQ  D:  07/19/2003  T:  07/20/2003  Job:  [id number]

## 2010-07-12 NOTE — Op Note (Signed)
NAME:  [name], [name]                          ACCOUNT NO.:  [id number]   MEDICAL RECORD NO.:  [id number]                   PATIENT TYPE:  OIB   LOCATION:  2550                                 FACILITY:  [facility]   PHYSICIAN:  Deidre Ala, M.D.                 DATE OF BIRTH:  [birth year]   DATE OF PROCEDURE:  10/14/2001  DATE OF DISCHARGE:                                 OPERATIVE REPORT   PREOPERATIVE DIAGNOSES:  Chronic right trochanteric bursitis with prominent  greater trochanter, status post  exhaustive conservative therapy.   POSTOPERATIVE DIAGNOSES:  Chronic right trochanteric bursitis with prominent  greater trochanter, status post  exhaustive conservative therapy.   OPERATION PERFORMED:  1. Right hip greater trochanteric ostectomy.  2. Iliotibial band release.  3. Trochanteric bursectomy.   SURGEON:  Bradley Ferris, M.D.   ASSISTANT:  Anise Salvo. Clark, P.A.-C.   ANESTHESIA:  General endotracheal.   CULTURES:  None.   DRAINS:  None.   ESTIMATED BLOOD LOSS:  Less than 75 cc.   FLUIDS REPLACED:  Without.   PATHOLOGIC FINDINGS AND HISTORY:  [name] was referred to me as a 77 year old  male referred by Dr. Chanda Busing, an old patient of the practice, new  problem of six months' duration of right hip pain starting March 22, 2001.  It has been progressively worsening and it getting to the point where it is  curtailing his walking.  He could not walk longer than a quarter of a mile  in and about three to four minutes with sitting down thereafter and resting.  The pain was intermittent.  X-rays of the hip revealed some slight sclerosis  of acetabular margins, a little bit of spurring and some hypertrophy with  spurring of the greater trochanter and he was tender over the trochanter.  No signs of sciatica or intra-articular hip disease.  We injected him with  cortisone and Marcaine.  He had almost immediate improvement of symptoms.  We saw him back April 19, 2001.   His hip had improved, not tender  laterally.  We continued him on medications.  He also had some rectus  femoris tendinitis.  By May 17, 2001 he was still having some right hip  pain behind the trochanter, over the top and down the anterior thigh comes  with walking, and we did some stretching exercises.  By June 16, 2001 the  hip bursa was still painful.  We reinjected it with cortisone Marcaine.  By  July 30, 2001 he got a couple of weeks relief from the last injection and it  had returned as bad as it ever was.  At this point we felt that operative  intervention was indicated.  At surgery he had a very tight iliotibial band,  a thick trochanteric bursa and a very prominent lateral greater trochanter.  We did an iliotibial band release in cruciate configuration, excised  the  bursa and then incised the fascia over the trochanter and excised the sharp  trochanteric prominence with an osteotome and rongeur and smoothed with bone  wax.   With adequate anesthesia obtained, using endotracheal technique and this was  by no means an easy intubation.  It was difficult, requiring two  anesthesiologists and attempt at fiberoptic placement ultimately Dr.  Zoila Shutter was able to place the tube in the classic manner.  The patient was  placed in the left lateral decubitus with the right side up.  We then  prepped and draped over the trochanter.  After standard prepping and draping  and 1 gm Ancef given IV prophylaxis, an incision was made longitudinally  over the trochanter, dissection carried down to the subcutaneous tissues to  the iliotibial band.  Incision deepened sharply with a knife and hemostasis  obtained using the Bovie electrocoagulator.  Bleeding points were  cauterized.  The iliotibial band was incised longitudinally and then  anterior posterior to make a cruciate shape and this was left open.  I then  sharply excised with scissors the trochanteric bursa.  I then incised the  fascia over  the prominent trochanter and dissected down to bone over about a  3 x 2 cm area oval shaped.  I then excised this bony prominence with a sharp  osteotome and smoothed with a rongeur.  I then used a thin layer of bone  wax.  Irrigation was carried out and the wound was closed in layers with 0  and 2-0 Vicryl on the subcutaneous and skin staples.  A bulky sterile  compressive dressing was applied.  The patient tolerated the procedure well,  was awakened and taken to the recovery room in satisfactory condition for  routine postoperative care and overnight observation.                                                 Deidre Ala, M.D.    VEP/MEDQ  D:  10/14/2001  T:  10/18/2001  Job:  [id number]   cc:   Madaline Savage, M.D.

## 2010-07-12 NOTE — H&P (Signed)
NAME:  [name], [name]                          ACCOUNT NO.:  [id number]   MEDICAL RECORD NO.:  [id number]                   PATIENT TYPE:  OIB   LOCATION:  NA                                   FACILITY:  [facility]   PHYSICIAN:  Madaline Savage, M.D.             DATE OF BIRTH:  1924/05/18   DATE OF ADMISSION:  05/29/2003  DATE OF DISCHARGE:                                HISTORY & PHYSICAL   CHIEF COMPLAINT:  Chest pressure and dyspnea on exertion with abnormal  Cardiolite and need for cardiac catheterization.   HISTORY OF PRESENT ILLNESS:  Mr. Rodkey is a 77 year old African American  male with a history of CAD status post CABG x5 in 1989 by Dr. Andrey Campanile.  Subsequently, he had a positive Cardiolite and underwent repeat  catheterization with PTCA to the circumflex marginal vein graft in May 2002.   Mr. Nesmith last office visit with Dr. Elsie Lincoln in 2004, he was clinically  doing very well.  He was having no anginal symptoms.  At that time, we were  able to continue current medications and plan to see him back on a routine  basis.  However, recently Mr. Stefanko had seen Dr. Michelene Heady his primary care  physician.  At the time of his visit with him, he reported to him having  some recent progressive increased dyspnea on exertion.  He says he was  walking about a half of a block, he would develop significant dyspnea.  As  well, he would have occasional pressure and heaviness in his chest with  exertion as well.  He reported this to Dr. Michelene Heady who then spoke with Dr.  Elsie Lincoln.  Subsequently, a Cardiolite scan was ordered.   He underwent Cardiolite scan on April 28, 2003.  This shows moderate size  inferior septal nontransmural MI with moderate amount of reversible  periinfarct ischemia.  EF 44%.  Compared to the prior study of December  2003, the inferior wall scar and inferior wall reversible ischemia was noted  previously.  However, the Cardiolite also showed nonspecific T wave changes.  The stress  EKG was positive for myocardial ischemia with development of 1 mm  horizontal ST segment depression and T wave changes against __________  ischemia.   __________ given, the Cardiolite findings and the patient's anginal  symptomatology, we need to proceed with diagnostic cardiac catheterization.  Risks and benefits of the procedure have been discussed with the patient and  his wife and they have been agreeable to proceed.   PAST MEDICAL HISTORY:  1. Coronary artery disease as above.  2. Peripheral vascular disease.  He has had history of arteriogram performed     by Dr. Gery Pray.  This has shown a 50% right iliac and a 99% right common     femoral stenosis going into the profunda.  The popliteal was 50-60%     stenosed on the right.  On the left,  there was as subtotal or total     occluded left SFA.  The popliteal reconstituted with a 60% stenosis and 2     vessel runoff.  3. Hyperlipidemia.  4. History of inflammation of the stomach which has been controlled.  5. History of hip surgery with continued pain there.  6. Status post echocardiogram July 2003 with findings significant for     moderate aortic stenosis with aortic valve area about 0.85.   CURRENT MEDICATIONS:  1. Lescol XL 80 mg q.h.s.  2. Pletal 100 mg daily.  3. Toprol XL 25 daily.  4. Aspirin 325 daily.  5. Zetia 10 daily.  6. Aciphex 20 daily.  7. Flomax 0.4 q.h.s.  8. Pepcid AC q.h.s.  9. Nitroglycerin p.r.n. as directed.   ALLERGIES:  SHRIMP.   SOCIAL HISTORY:  He is married.  Both have been married before and have  children from previous marriages.  He quit smoking at age 23(!).   FAMILY HISTORY:  Noncontributory since he has known disease himself.   REVIEW OF SYSTEMS:  He has had no peptic ulcer disease, no GI bleed.  He has  had no strokes, no TIA's.  He has had dyspnea on exertion and chest pressure  as above.  Notes he has a positive hyperlipidemia.  No thyroid disease.  Other systems negative.   PHYSICAL  EXAMINATION:  VITAL SIGNS:  Temperature 98, heart rate 72,  respirations 20, blood pressure 134/62, oxygen saturation 97%, height 5 feet  4 inches, weight 149.  GENERAL APPEARANCE:  Well-nourished, well-developed, African American male  presenting in no acute distress.  NECK:  Supple.  No JVD.  2+ right carotid pulses with no audible bruits, but  he does have murmur radiating up his neck that is hard to auscultate for  bruits.  HEART:  Regular rhythm with a 3/6 murmur in the right segmental costal  space.  This really has a 2-3/6 murmur at the apex.  LUNGS:  Clear.  ABDOMEN:  Soft without hepatosplenomegaly.  EXTREMITIES:  There is no lower extremity edema, and feet are warm.   EKG:  Normal sinus rhythm, right bundle branch block, nonspecific ST-T wave  changes probably related to the bundle branch block.   LABORATORY DATA:  Labs were drawn as an outpatient on May 24, 2003, with  no significant abnormalities there.   IMPRESSION:  1. Stable angina.  2. Abnormal Cardiolite.  3. History of coronary artery disease.  4. Significant murmur which needs to be followed up.  This seems to be     louder on exam compared to prior exam findings.  5. History of known moderate to severe aortic stenosis which needs to be     followed up.  6. Hyperlipidemia.  7. Peripheral vascular disease.  8. Shrimp allergy, therefore, he is being prophylactically treated for his     catheter dye.   PLAN:  At this time, Mr. Sheffler is agreeable to proceed with definitive  cardiac catheterization.  In addition to following with catheterization, I  will also make sure we follow up his aortic valve.      Mary B. Easley, P.A.-C.                   Madaline Savage, M.D.    MBE/MEDQ  D:  05/29/2003  T:  05/29/2003  Job:  [id number]   cc:   Michelene Heady, M.D.

## 2010-07-12 NOTE — Discharge Summary (Signed)
NAME:  [name], [name]                          ACCOUNT NO.:  [id number]   MEDICAL RECORD NO.:  [id number]                   PATIENT TYPE:  INP   LOCATION:  2003                                 FACILITY:  [facility]   PHYSICIAN:  Evelene Croon, M.D.                  DATE OF BIRTH:  11/18/1924   DATE OF ADMISSION:  07/19/2003  DATE OF DISCHARGE:  07/24/2003                                 DISCHARGE SUMMARY   PRIMARY ADMITTING DIAGNOSIS:  1. Recurrent coronary artery disease.  2. Severe aortic stenosis.   ADDITIONAL/DISCHARGE DIAGNOSES:  1. Recurrent three-vessel coronary artery disease.  2. Severe aortic stenosis.  3. Hyperlipidemia.  4. Peripheral vascular disease.  5. History of gastritis.  6. Benign prostatic hypertrophy.  7. Status post previous coronary artery bypass grafting x 5 in 1989 by Dr.     Andrey Campanile.   PROCEDURES PERFORMED:  1. Redo coronary artery bypass grafting x 1 (saphenous vein graft to the     posterior descending).  2. Aortic valve replacement with a 21-mm pericardial valve.  3. Endoscopic vein harvest, right thigh.   HISTORY:  The patient is a 77 year old black male with a known history of  coronary artery disease.  He is status post previous CABG x5 in 1989 by Dr.  Andrey Campanile.  He did well until May of 2002 at which time he underwent  percutaneous intervention on the left circumflex.  He underwent a Cardiolite  scan in March of 2005, which showed a moderate-sized inferior and septal  nontransmural myocardial infarction with moderate amount of reversible peri-  infarct ischemia.  His ejection fraction was 44%.  He had had a several-  month history of progressive shortness of breath with exertion as well as  mild chest discomfort with exertion.  He underwent cardiac catheterization  in April of 2005 and was found to have severe calcification of the coronary  arteries with severe native three-vessel coronary artery disease.  He had  severe aortic stenosis with a peak  gradient of 57 and a mean gradient of 50  with a calculated valve area of 0.63 cm2.  Left ventricular ejection  fraction was 45-50%.  After review of the angiogram and his previous  echocardiogram, he was referred to Dr. Evelene Croon for evaluation for  possible surgical revascularization and aortic valve replacement.  Dr.  Laneta Simmers saw the patient and reviewed his films and agreed that his best  course of action would be to proceed with surgery at this time.  The patient  agreed and was scheduled for outpatient admission on Jul 19, 2003.   HOSPITAL COURSE:  He was admitted to [facility] on Jul 19, 2003, and  underwent redo CABG x1 as well as aortic valve replacement using a 21-mm  Magna pericardial tissue valve.  He tolerated the procedure well and was  transferred to the SICU in stable condition.  He was able  to be extubated  shortly after surgery.  He was hemodynamically stable and doing well on post-  op day 1.  At that time he was found to be mildly volume overloaded and was  started on Lasix.  His chest tubes and hemodynamic monitoring devices were  removed, and he was able to be transferred to the floor later in the day.  It was Dr. Sharee Pimple opinion that he should be maintained on aspirin only  with his tissue valve provided he remained in sinus rhythm, and aspirin was  started accordingly.  Postoperatively he has done very well.  He has been  weaned from supplemental oxygen and is maintaining O2 sats of greater than  90% on room air.  He has been diuresed back down to within about one pound  of his preoperative weight and on physical exam has no evidence of  significant edema.  He is mildly anemic with a hemoglobin of 8.4, hematocrit  of 24.5 and has been started on iron replacement.  His other labs have been  stable with a platelet count of 97,00, which is trending upward.  Also a  white count of 5.0.  Potassium 4.0, BUN 14, creatinine 1.3.  He has been  tolerating a regular diet  and is having normal bowel and bladder function.  His surgical incision sites are all healing well.  He is ambulating in the  halls without difficulty.  It is felt if he continues to remain stable over  the next 24 hours, he will hopefully be ready for discharge home on Jul 24, 2003.   DISCHARGE MEDICATIONS:  1. Enteric-coated aspirin 325 mg q.d.  2. Toprol XL 25 mg q.d.  3. Zetia 10 mg q.d.  4. Lescol XL 80 mg q.d.  5. Paxil CR 12.5 mg q.d.  6. Flomax 0.4 mg q.d.  7. Pletal 100 mg b.i.d.  8. Aciphex 20 mg q.d.  9. Tylox 1-2 q.4h. p.r.n. for pain.   DISCHARGE INSTRUCTIONS:  He is to refrain from driving, heavy lifting or  strenuous activity.  He may continue daily walking and use of his incentive  spirometer.  He may shower daily and clean his incisions with soap and  water.  He will continue a low-fat, low-sodium diet.   DISCHARGE FOLLOWUP:  He is asked to make an appointment to see Dr. Elsie Lincoln in  two weeks.  He will then follow up with Dr. Evelene Croon in three weeks, and  our office will call to arrange this appointment.  He will be scheduled for  a chest x-ray at [facility] one hour before his  appointment with Dr. Laneta Simmers and should bring his films to our office for Dr.  Laneta Simmers to review.  He is asked to call the office in the interim if he  experiences any problems or has questions.      Coral Ceo, P.A.                        Evelene Croon, M.D.    GC/MEDQ  D:  07/23/2003  T:  07/23/2003  Job:  [id number]   cc:   Madaline Savage, M.D.  [address]  [place]  [place] [address]  Fax: [phone]   Sharyn Dross., M.D.  [address]  [address]  [place]  [place] [address]  Fax: [phone]

## 2010-07-12 NOTE — Cardiovascular Report (Signed)
NAME:  [name], [name]                          ACCOUNT NO.:  [id number]   MEDICAL RECORD NO.:  [id number]                   PATIENT TYPE:  OIB   LOCATION:  2855                                 FACILITY:  [facility]   PHYSICIAN:  Madaline Savage, M.D.             DATE OF BIRTH:  [birth year]   DATE OF PROCEDURE:  05/29/2003  DATE OF DISCHARGE:  05/29/2003                              CARDIAC CATHETERIZATION   PROCEDURES PERFORMED:  1. Selective coronary angiography by Judkins technique.  2. Retrograde left heart catheterization.  3. Left ventricular angiography.  4. Right heart catheterization.  5. Selective visualization of the right common iliac vessel.  6. Selective visualization of multiple saphenous vein grafts.  7. Selective of the left internal mammary artery graft.   PERCUTANEOUS INTERVENTION:  None.   ENTRY SITE:  1. Right percutaneous femoral and iliac artery on the right.  2. Left femoral artery.  3. Right femoral vein.   COMPLICATIONS:  None.   PATIENT PROFILE:  [name] is a 77 year old diabetic gentleman with known  coronary disease who underwent bypass grafting in 1989 by [name]  and had a left internal mammary artery graft to the LAD, sequential vein  graft to intermediate branch of the left main coronary artery as well as OM-  1 and OM-2 and a saphenous vein graft to the right coronary artery.  The  patient has had two stents placed in his right coronary artery graft in the  past by me as well as a stent in the mid body of his sequential graft to  ramus, OM-1 and OM-2.   A recent outpatient Cardiolite stress test was performed and the patient  showed old inferior infarction with a large area of periinfarct ischemia.  Left ventricular ejection fraction was 50%.   Clinically, the patient also has right lower extremity claudication.  This  procedure was performed initially from the right percutaneous femoral  approach and I was unable to drive a  J-tipped guide wire up the right common  iliac and I was also unable to place either a Wholey or a glide wire across  the area.  I therefore performed an angiogram using a right coronary  catheter pointed toward the area of suspected occlusion and by infusion of a  small amount of dye I was able to see a large common iliac that tapered down  into a beak and gave a small amount of dye into an eccentric lumen that  filled up to the aortic bifurcation.  This was felt to represent either a  recannulized total occlusion or a very eccentric lesion in the right common  iliac of about 99%.   I therefore performed the remainder of the patient's catheterization from  the left femoral artery which was a patent vessel.  The right femoral vein  was used for Swan-Ganz catheter pressure determinations.   RESULTS:   PRESSURES:  The  right atrial mean pressure was 10.  The right ventricular  pressure was 25/2, end-diastolic pressure 7.  The pulmonary artery pressure  was 25/10, mean of 17.  Pulmonary capillary wedge mean was 14.  The left  ventricular pressure was 195/7, end-diastolic pressure was 16.  The right  common iliac pressure was 115/65, mean of 85.  The left femoral artery was  160/65, mean of 100.  The pullback pressure across the left ventricle to the  central aorta revealed the following pressures:  Left ventricle 195/7, end-  diastolic pressure 16; central aorta 135/60, mean of 90.  The peak gradient  was 57, the mean gradient was 50 and the valve area was 0.63 sq cm.  There  was no significant mitral valve gradient noted.  The Fick cardiac output was  4.5. The thermodilution cardiac output was 4.6.  The Fick cardiac index was  2.62.  The thermodilution cardiac index was 2.67.  The systemic vascular  resistance was 1580.   ANGIOGRAPHIC RESULTS:  1. The entire vascular tree of this patient was calcified including the     distal aorta, both common iliacs, the aortic root, all coronary  arteries,     but no pericardial or valvular calcification was noted.  2. The left main coronary artery was 100% occluded at the ostium.  3. The right coronary artery was 100% occluded in the mid portion with a     recannulized segment of vessel filling the distal RCA by way of     intracoronary collateral flow in a right-to-right direction.  4. The right coronary artery saphenous vein graft was 100% occluded     proximally just at the entry site and to a saphenous vein graft stent.     There was also a second stent more distal that was radioopaque, but no     flow was seen antegrade into anything but the first few centimeters of     graft.  This was taken in multiple views.  It had the appearance of a     chronic total occlusion and given what was subsequently learned about his     other vessels, no attempt was made to perform percutaneous intervention.  5. The sequential saphenous vein graft to intermediate ramus, then OM-1,     then OM-2 was widely patent throughout including the stented portion of     this vein graft which contained a widely patent radioopaque stent with no     in-stent restenosis that fills the mid body of the vein graft just before     it is anastomosed to the intermediate ramus.  The distal flow into     intermediate ramus, OM-1 and OM-2 was excellent and there was great deal     of backfilling of the circumflex, distal, mid and proximal including the     atrial circumflex branches, so this saphenous vein graft is performing     beautifully now and in its 17th year of life.  6. The internal mammary artery graft was widely patent throughout and the     distal portions of the LAD are patent and appear normal.  There is also     noted to be left-to-right collateralization of the posterior descending     branch of the RCA by way of septal perforator branches from the left.   Left ventricular angiography showed inferobasal hypokinesis with what appeared to be a small  inferobasal aneurysm.  The EF was considered to be 45-  50%.   The patient tolerated the procedure well.  He will be taken to the holding  area where he will have a 7 [place] sheath removed from the right femoral  artery, a 7 [place] removed from the left femoral artery and 8 French sheath  removed from the right femoral vein.   FINAL DIAGNOSES:  1. Aortic valvular stenosis severe and asymptomatic.  2. Right lower extremity claudication greater than left with 99% stenosis of     the common iliac on the right with a 45 mm gradient.  3. 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  No mitral regurgitation.  4. Four-vessel coronary artery disease.     A. Left main occlusion at ostium.     B. 100% recannulized occlusion of the mid right coronary artery.     C. Status post coronary artery bypass grafting in 1988.     D. Occluded saphenous vein graft to right coronary artery.  5. Widely patent sequential vein graft to intermediate ramus branch #1 and     intermediate ramus branch #2 with good distal runoff.  6. Patent left internal mammary artery graft to left anterior descending     with collateralization of right coronary artery from left anterior     descending and from right-to-right collaterals as well.   PLAN:  1. Consider the patient for a right common iliac stent.  2. Follow patient closely for signs and symptoms of symptomatic aortic     stenosis and consider aortic valve replacement when and if that occurs.  3. Medical therapy of his coronary artery disease.                                               Madaline Savage, M.D.    WHG/MEDQ  D:  05/29/2003  T:  05/30/2003  Job:  [id number]   cc:   [facility] M.D.  [address]  [address]  [place]  [place] [address]  Fax: [phone]

## 2010-07-12 NOTE — Procedures (Signed)
[place]  Patient:    [name], [name]                      MRN: [id number] Proc. Date: 07/05/99 Attending:  Sharyn Dross., M.D.                           Procedure Report  PREOPERATIVE DIAGNOSIS:  History of colon polyps.  POSTOPERATIVE DIAGNOSIS:  Colon polyps in the proximal ascending colon adjacent to the cecal pouch.  Otherwise colonoscopic examination.  PROCEDURE PERFORMED:  Colonoscopy with hot biopsy.  MEDICATIONS USED:  Demerol 50 mg IV, Versed 3 mg IV over a 10-minute period of time.  INSTRUMENT USED:  Olympus video pancolonoscope.  ENDOSCOPIST:  Dortha Kern, M.D.  INDICATIONS:  This pleasant 77 year old gentleman known to me in the past with a history of colon polyps. He is brought back in for re-evaluation after the diagnosis of adenomatous polyps was made in the year 2000.  He has no major complaints otherwise.  OBJECTIVE FINDINGS:  He is a pleasant gentleman who appears to be in no distress.  His vital signs are stable.  The HEENT examination was anicteric. Neck was supple.  Lungs were clear.  Heart had a regular rate and rhythm without heaves, thrills, murmurs or gallop.  The abdomen was soft.  No tenderness, no hepatosplenomegaly.   The extremities were within normal limits.  PLAN:  To proceed with the colonoscopic examination.  INFORMED CONSENT:  The patient was advised of the procedure, the indications and the risks involved.  The patient has agreed to have the procedure performed.  PREOPERATIVE PREPARATION:  The patient was brought to the endoscopy unit where an IV for IV sedating medication was started.  A monitor was placed on the patient to monitor the patients vital signs and oxygen saturation.  Nasal oxygen at two liters a minute was used and after adequate sedation was performed, the procedure was begun.  BOWEL PREP:  The patient was given GoLYTELY and Reglan as a bowel prep at this time.  The patient  tolerated the prep well without any complications.  The quality of the prep was excellent.  DESCRIPTION OF PROCEDURE:  The instrument was advanced with the patient lying in the left lateral position to approximately 90 cm in the proximal colon to the cecal region.  This was confirmed by palpation, transillumination as well as visualization of what appeared to be the ileocecal valve region.  There appeared to be evidence of a proximal polypoid lesion that was present in the proximal portion of the ascending colon near the cecal pouch that was noted.  This polypoid lesion was hot biopsied and removed at this time. Otherewise there were no other masses or strictures or polypoid lesions appreciated.  The vascular pattern appeared to be well within normal limits throughout the entire colon.  The mucosal pattern showed no evidence of any granular changes or diverticular changes at this time.  There was no evidence of any internal or external hemorrhoids upon exiting from the area.  The instrument was removed from the rectum without difficulty with the patient tolerating the procedure well.  TREATMENT: 1. Conservative management at this time. 2. Will recommend repeating the procedure in approximately one and a half to    two years. DD:  07/05/99 TD:  07/07/99 Job: [id number]

## 2010-07-12 NOTE — Procedures (Signed)
NAMEKAYLEE, WOMBLES NO.:  [id number]   MEDICAL RECORD NO.:  [id number]          PATIENT TYPE:  OUT   LOCATION:  SLEEP CENTER                 FACILITY:  [facility]   PHYSICIAN:  Clinton D. Maple Hudson, M.D. DATE OF BIRTH:  Jul 20, 1924   DATE OF STUDY:                              NOCTURNAL POLYSOMNOGRAM   REFERRED BY:  Osvaldo Shipper. Spruill, M.D.   PROCEDURE:  Polysomnogram.   OPERATOR:  Clinton D. Maple Hudson, M.D.   INDICATIONS FOR PROCEDURE:  Insomnia with sleep apnea.   EPWORTH SLEEPINESS SCALE:  4/24.   BODY MASS INDEX:  25.   WEIGHT:  150 pounds.   SLEEP ARCHITECTURE:  Total sleep time was 355 minutes, with sleep efficiency  89%.  Stage I was 6%, stage II was 76%, stages III and IV were absent.  REM  was 17% of total sleep time.  Sleep latencies 7, REM latency 114 minutes.  Awake after sleep onset was 35 minutes.  Arousal index was 16.   RESPIRATORY DATA:  Split study protocol:  RDI 26.3 obstructive events per  hour, indicating moderate obstructive sleep apnea/hypopnea syndrome before  CPAP.  This included 37 obstructive apneas, three central apneas, one mixed  apnea and 54 hypopneas before CPAP.  He slept only supine.  REM RDI was 69  per hour.  CPAP was titrated to 13 CWP, RDI 0 per hour, using a small  comfort gel mask with heated humidifier.  The patient slept with dentures on  this night to allow for the possibility of air leak if he removes dentures  at home.  The technician added a chin strap, which may not be necessary.   OXYGEN DATA:  Moderate to loud snoring with oxygen desaturation to 87%.  After CPAP control, saturation held at 95%-96% on room air.   CARDIAC DATA:  Normal sinus rhythm.   MOVEMENTS/PARASOMNIA:  Occasional leg jerk with insignificant effect on  sleep.   IMPRESSION/RECOMMENDATIONS:  Moderate obstructive sleep apnea/hypopnea  syndrome, RDI 26.3 per hour with oxygen desaturation to 87%.  CPAP titration  to 13 CWP, RDI 0 per hour, using a  small Comfort Gel Mask.   NOTATION:  The technician tried the addition of a chin strap, which could be  considered if the patient complains of air leaks and oral venting, if trying  to sleep without dentures at home.                                                           Clinton D. Maple Hudson, M.D.  Diplomate, American Board   CDY/MEDQ  D:  02/25/2004 14:47:32  T:  02/25/2004 16:03:51  Job:  [id number]

## 2010-07-12 NOTE — Cardiovascular Report (Signed)
[place]  Patient:    [name], [name]                       MRN: [id number] Proc. Date: 07/21/00 Adm. Date:  [id number] Attending:  Ophelia Shoulder CC:         Cath Lab   Cardiac Catheterization  PROCEDURE: 1. Percutaneous coronary balloon angioplasty then stenting to the midbody of    a saphenuos vein graft to the circumflex. 2. Percutaneous coronary stenting to the mid-RCA graft. 3. Percutaneous coronary stenting to the proximal graft to the right coronary    artery. 4. Selective coronary angiography by Judkins technique. 5. Retrograde left heart catheterization. 6. Left ventricular angiography.  COMPLICATIONS:  None.  ENTRY SITE:  Right femoral artery.  DYE USED:  Omnipaque.  MEDICATIONS:  Nubane 2 mg IV for back pain, Integrilin double bolus and infusion on a weight-adjusted nomogram, Angiomax according to the Replace II trial.  INDICATIONS:  The patient is a 77 year old African-American gentleman who underwent coronary artery bypass grafting in 1985.  Five grafts were done. There was a left internal mammary artery graft to the left anterior descending coronary artery, a sequential vein graft to three touchdown sites including intermediate, first marginal, and second marginal of the circumflex system, and then a single vein graft to the posterior descending branch to the RCA. He required stenting of the upper portion of the midbody of the circumflex vein graft in June of 2000.  Recently, the patient complained of a burning chest discomfort and a Cardiolite study which was completed showing ischemic in the circumflex marginal territory.  Todays catheterization was scheduled electively.  RESULTS:  The patients left ventricular pressure was 187/13 with an end diastolic pressure of 22.  Central aortic pressure was 160/78 with a mean of 110.  There was an aortic valve gradient of 27 mmHg.  ANGIOGRAPHIC RESULTS:  The left main  coronary artery was 100% occluded proxially.  The right coronary artery was diffusely diseased.  There was a recannalized total occlusion in the midportion of the vessel and a 90% distal RCA stenosis.  The left internal mammary artery graft to the LAD was widely patent and both the LAD and a medium size diagonal branch had good distal runoff and appeared normal.  The saphenuos vein graft to the right coronary artery contained a long lesion in the proximal portion of the vein graft not at the ostium, but just after the ostium extending some 20 to 25 mm in length.  There was a short area of minimally diseased vein graft followed by another area of 75% vein graft narrowing.  There were luminal irregularities all along the right vein graft.  The sequential vein graft to the intermediate ramus and first and second obtuse marginal branches showed that the stented portion of the vein graft just before the insertion into the intermediate ramus was widely patent. After the intermediate ramus, the vein graft contained a 99% eccentric stenosis that was a B1 type lesion followed by a fairly normal segment of vein graft going to the first and second obtuse marginal branches.  Good distal runoff was noted.  The left ventricle showed an infrabasal, severely hypokinetic area without obvious clot.  It had the appearance of an early aneurysm.  The remaining segments of anterior apical and inferoapical walls moved vigorously.  The ejection fraction estimate was 45%. There was no mitral regurgitation.  PERCUTANEOUS INTERVENTION RESULTS:  The first lesion  worked on was the vein graft to the midbody of the circumflex.  A 7 Jamaica hockeystick guide with sideholes was used as was a Radiographer, therapeutic and a predilatation balloon consisting of a 2.5 x 15 mm Maverick balloon.  Predilatation to 6 atmospheres produced an excellent result with the lesion being improved from 99% to about 40%.  I then stented the vessel  with a 3.5 x 9 mm Nir Elite and inflated to inflation pressures of 13 atmospheres of pressure on two occasions.  The result went from 99% to 0% residual with Timi 3 class distal flow.  The vein graft to the right coronary artery was intubated with a 7 French right coronary artery bypass guide catheter with sideholes.  The Patriot wire was again used.  I direct stented the midportion of this vein graft with a 3.0 x 20 mm Radius self expanding stent which did not require balloon inflations because it was self expanding.  I then worked on the proximal portion of the vein graft to the right coronary artery with no predilatation with a balloon, but we did use a direct stent which was 3.0 x 25 mm Nir Elite inflated to 13 atmospheres of pressure.  This lesion required postdilatation stenting of the overlap between the distal segment and the vein graft itself.  I used 4 atmospheres of pressure for a minute on that segment.  Both 75% lesions were reduced to 10% residual and although there were luminal irregularities, the graft looked very good.  The patient tolerated this procedure well and there were no complications.  FINAL DIAGNOSES: 1. Severe four vessel coronary artery disease as described above. 2. Successful percutaneous stenting of the midbody of the sequential vein    graft to the circumflex, intermediate, and OM1, OM2. 3. Successful percutaneous stenting to two sites in the right coronary artery    saphenuos vein graft proximal and mid with reduction of 75% lesions to 10%    in both locations. 4. Moderate left ventricular dysfunction ejection fraction 45%. 5. Aortic valve gradient 27 mmHg, suspect mild aortic insufficiency. DD:  07/21/00 TD:  07/21/00 Job: [id number]

## 2010-07-12 NOTE — Cardiovascular Report (Signed)
[place]. [facility]  Patient:    [name], [name] Visit Number: [id number] MRN: [id number]          Service Type: END Location: ENDO Attending Physician:  Sharyn Dross Proc. Date: 08/20/98 Admit Date:  01/14/2001 Discharge Date: 01/14/2001   CC:         [facility] Sur[facility]             Cardiac Catheterization Laboratory             Madaline Savage, M.D.                        Cardiac Catheterization  PROCEDURES PERFORMED: 1. Intracoronary artery stent deployment to the mid body of the left circumflex    vein graft to the first obtuse marginal and second obtuse marginal branch of the    circumflex. 2. Pre-dilitation of the above-stated lesion with coronary artery angioplasty    with an adequate result. 3. Selective coronary angiography by Judkins technique. 4. Retrograde left heart catheterization. 5. Left ventricular angiography. 6. Selective visualization of multiple saphenous vein grafts. 7. Selective visualization of left internal mammary artery graft. 8. Selective visualization of the right internal mammary artery.  COMPLICATIONS:  None.  ENTRY SITE:  Left femoral.  DYE USED:  Omnipaque.  PATIENT PROFILE:  The patient is a delightful 77 year old gentleman who underwent coronary artery bypass grafting in 1989 by Dr. Particia Lather.  He is treated for hyperlipidemia.  He was recently found to have peripheral vascular disease and as lower extremity claudication.  He has been evaluated both by a peripheral vascular disease cardiologist and Dr. Jake Samples from [facility] surgery and medical therapy as recommended.  The patient recently had chest pain.  A nuclear medicine stress test performed t [facility] showed a anterolateral wall defect and todays outpatient catheterization was prompted to further evaluate the status of his coronary artery grafts.  RESULTS:  PRESSURES:  The left ventricular pressure was  165/29, the central aortic pressure 155/80 with a mean of 110.  The aortic valve gradient was 10 mmHg.  The left main coronary artery was 100% occluded proximally!   The right coronary artery was diffusely diseased over a 50- to 60-mm segment between the proximal and mid vessel.  The distal RCA was collateralized by a vein graft.  The left internal mammary artery was widely patent throughout its course and the distal runoff into the LAD was good.  Nonselective visualization of the right internal mammary artery showed this vessel to be widely patent over the proximal area of the vessel.  The saphenous vein graft to the right coronary artery contained a patchless proximal anastomotic site followed by an area of 60% stenosis in the proximal portion of the vessel.  There was segmental stenosis after the 60 mm area of stenosis for approximately 15 mm, and then the graft becomes what appears to be  normal in diameter supplying a rich posterior descending branch that was not significantly diseased.  The left ventricle showed inferobasal and inferior wall hypogenesis of mild degree. Ejection fraction was calculated at 42%.  INTERVENTION:  The guide catheter used for the left circumflex mid body graft lesion was a 7 French left coronary bypass guide.  A guidewire utilized was a 0.014 Hi-Torque Floppy tipped guidewire.  The pre-dilitation balloon was a 3.5 x 15 mm Gemini balloon by ACS.  The guide catheter provided excellent backup support in the vessel.  The guidewire crossed the lesion easily.  The initial balloon crossed he lesion easily and after inflating it to several times, there was a residual eccentric stenosis.  I then used a 3.5 x 18 mm Multi-Link TriStar stent carefully placing it over the lesion and distally well enough to cover luminal irregularities.  I inflated this with the stent balloon to just below rated burst pressure and still had an indentation.  I used two  additional balloons to post dilate, which included a 3.5 mm x 9 mm Solaris balloon and also a 3.5 x 9 mm noncompliant Ranger balloon after docking the guidewire.  After inflation of the noncompliant Ranger balloon twice to 20 atmospheres, I decided to stop.  The vessel continued to have a 10% to 20% eccentric stenosis, but had TIMI-III distal flow and the original lesion was significantly reduced to minimal narrowing.  The case was tolerated well.  There were no rhythm blood pressure or other type  complications.  FINAL DIAGNOSES: 1. Native coronary artery disease, severe.    a. A 100% proximal left main stenosis.    b. A 90% long segmental stenosis of proximal mid right coronary artery. 2. Mild left ventricular dysfunction and ejection fraction 42%. 3. Status post coronary bypass grafting.    a. Patent left internal mammary artery graft to mid left anterior descending.    b. A 60% stenosis of proximal right coronary artery vein graft.    c. A 95% eccentric stenosis of the vein graft to the obtuse marginal branch.  1. An obtuse marginal branch.       2. Left circumflex. 4. Successful coronary artery stenting of the circumflex vein graft with reduction    of a 95% lesion to 10%. Attending Physician:  Sharyn Dross DD:  08/20/98 TD:  08/20/98 Job: [id number]

## 2010-07-12 NOTE — Discharge Summary (Signed)
[place]. [facility]  Patient:    [name], [name]                       MRN: [id number] Adm. Date:  [id number] Disc. Date: 07/22/00 Attending:  Ophelia Shoulder Dictator:   Marya Fossa, P.A.                           Discharge Summary  DATE OF BIRTH:  10/02/1924  [facility] NUMBER:  [id number]  ADMISSION DIAGNOSES: 1. Abnormal Cardiolite which revealed ischemia in the circumflex marginal    territory, Jun 25, 2000, ejection fraction 42%. 2. Known coronary artery disease, status post coronary artery bypass graft. 3. Peripheral vascular disease. 4. Hyperlipidemia. 5. Benign neutropenia. 6. Dyspepsia.  DISCHARGE DIAGNOSES: 1. Abnormal Cardiolite, status post cardiac catheterization by Madaline Savage, M.D. revealing graft disease. 2. Known coronary artery disease, status post coronary artery bypass graft. 3. Peripheral vascular disease. 4. Hyperlipidemia. 5. Benign neutropenia. 6. Dyspepsia. 7. Status post percutaneous coronary intervention to saphenuos vein graft to    obtuse marginal 1/2/ramus, saphenuos vein graft to the posterior descending    mid, and saphenuos vein graft to the posterior descending proximal.  HISTORY OF PRESENT ILLNESS:  [name] is a 77 year old patient of Dr. Gambles with a history of CAD.  He is status post CABG in 1989 with five vessel bypass.  He had a LIMA to the LAD with sequential SVG to the intermediate, first and second marginal coronary, and then a single vein graft to the PDA.  This was done by Dr. Andrey Campanile.  His last catheterization was in 2000 at which time he had 100% proximal left main, 90% long segmental stenosis of the proximal and mid RCA, patent LIMA to the LAD, 60% stenosis in the proximal RCA graft, and 95% eccentric stenosis in the vein graft to the obtuse marginal of the left circumflex.  He underwent PTCA and stenting of the left circumflex vein graft reducing  the lesion from 95 to 10%.  He did fairly well since that time until recently.  The patient apparently had called in to the office with complaints of increasing chest discomfort.  He performed Persantine Cardiolite stress test on Jun 25, 2000, which indicated ischemia in the circumflex marginal territory and an EF of 42%.  Based on these findings cardiac catheterization is recommended.  PROCEDURE:  Cardiac catheterization on Jul 13, 2000, by Madaline Savage, M.D. with intervention to the SVG to the OM1/2/ramus.  SVG to the PDA mid and SVG to the PDA proximal.  COMPLICATIONS:  None.  CONSULTING PHYSICIANS:  None.  HOSPITAL COURSE:  [name] was admitted to [facility] on Jul 21, 2000, for cardiac catheterization with probable intervention after abnormal Cardiolite stress testing and crescendo angina.  Preprocedure laboratory studies showed a BUN of 12, creatinine 1.4, hemoglobin 15.3, and platelets 193.  INR 1.0.  Total cholesterol 232, triglycerides 119, HDL 53, and LDL 156.  The patient was enrolled in the Replace II Trial.  The patient was taken to the cardiac catheterization on Jul 13, 2000, by Madaline Savage, M.D.  This revealed 100% left main, LAD widely patent, circumflex 100% occluded in its proximal portion, 90% middistal RCA native.  SVG to the ramus sequentially to the OM1 and OM2 had a 99% subtotal occlusion just after its anastomosis to the ramus.  The LIMA to the LAD was patent and the SVG to the RCA had a 75% proximal and 75% midlesion.  Dr. Elsie Lincoln proceeded with intervention to three areas;  1) Sequential to the ramus OM1 and OM2.  He used a 3.5 x 9 Nir Elite stent reducing the 99% lesion to 0%.  2) In the mid SVG to the RCA he used a 3.0 x 20 Radius stent reducing the lesion from 75 to less than 10%.  3) In the proximal SVG to the RCA lesion, he reduced the 75% lesion to less than 10% using a 3.0 x 25 Nir Elite stent.  The patient tolerated  the procedure well and there were no complications.  The sheath was pulled without difficulty.  On Jul 22, 2000, the patient was seen and examined by Runell Gess, M.D. and felt stable for discharge to home.  Apparently a two-dimensional echocardiogram was ordered because of a diagnosis of mild AF.  The results of this study are pending.  DISCHARGE MEDICATIONS: 1. Aspirin 325 mg a day. 2. Plavix 75 mg a day. 3. Lescol XL 80 mg a day. 4. Platol 50 mg a day. 5. Nexium 40 mg a day. 6. Nitroglycerin 0.4 mg as needed for chest pain.  DISCHARGE INSTRUCTIONS:  No strenuous activity, lifting more than five pounds, or driving for two days. Low cholesterol, low salt, low fat diet.  May shower. Call the office with any problems or questions.  FOLLOW-UP:  He will follow up with Meriam Sprague A. Delanna Ahmadi, R.N., N.P. on June 11, at 3 p.m.  Dr. Elsie Lincoln will be in the office on that day. DD:  07/22/00 TD:  07/22/00 Job: [id number]

## 2010-07-12 NOTE — Cardiovascular Report (Signed)
NAMEARMANY, MANO NO.:  [id number]   MEDICAL RECORD NO.:  [id number]          PATIENT TYPE:  OIB   LOCATION:  5714                         FACILITY:  [facility]   PHYSICIAN:  Nanetta Batty, M.D.   DATE OF BIRTH:  1925/01/02   DATE OF PROCEDURE:  03/07/2004  DATE OF DISCHARGE:                              CARDIAC CATHETERIZATION   HISTORY:  [name] is a 77 year old, African-American male, patient of Drs.  Gamble and Spruill, who has a history of coronary artery disease, as well as  valvular disease.  He has had coronary artery bypass grafting and  pericardial aortic valve replacement by Dr. Evelene Croon on Jul 19, 2003.  He also has PVOD with documented infrainguinal disease by angiography seven  years ago.  He is complaining of progressive lower extremity claudication.  He presents now for angiography, potentially with __________.   DESCRIPTION OF PROCEDURE:  The patient was brought to the sixth floor [facility] peripheral vascular angiographic suite in the post-absorptive state.  He was premedicated with p.o. Valium.  His left groin was prepped and shaved  in the usual sterile fashion.  Xylocaine 1% was used for local anesthesia.  A 5 French sheath was inserted into the left femoral artery using standard  Seldinger technique.  A 5-French __________ catheter and cross-over catheter  were used for mid and distal abdominal aortography with bifemoral runoff.  Peek holes were performed over the knee and groin area, respectively  Visipaque dye was used for the entirety of the case.  Retrograde aortic  pressure was monitored during the case.   ANGIOGRAPHIC RESULTS:  1.  Abdominal aorta:  Renal arteries.      1.  A 40% left renal artery stenosis.  2.  Left lower extremity.      1.  A 40% proximal left common iliac artery stenosis.      2.  Total left superficial femoral artery with reconstitution of above-          the-knee popliteal in the profundus femoris  collaterals.  There was          one-vessel runoff near the peroneal with a 95% tibial peroneal trunk          stenosis.  3.  Right lower extremity.      1.  A 99% proximal right common iliac artery stenosis with trickle flow.      2.  Total superficial femoral artery with reconstitution of the above-          the-knee popliteal.      3.  Two-vessel runoff with a 95% tibioperoneal trunk stenosis.   DESCRIPTION OF PROCEDURE:  The patient received 2500 units of heparin  intravenously.  Contralateral access was obtained with a short IMA 5 French  catheter and an 035 angled glide wire.  This crossed the lesion with minor  difficulty, and using a short 4 [place] tapered end-hole catheter. an  exchange was performed with a Rosen wire.  Contralateral access was then  obtained with a 7 French MP1 Terumo long right tipped sheath.  PTA  was  performed with a 4 upgrade to a 6 x 2 Powerflex.  The stenting was a 7 x 24  Genesis __________ at 10 atmospheres, resulting in reduction of a 99%  subtotal occlusion to 0% residual with excellent flow.  The patient  tolerated the procedure well.  There were no obvious dissections.  The guide  wire was removed.  The __________ measured at 170, and the  sheath was removed.  Pressure was held until we achieved hemostasis.  The  patient left the lab in stable condition.  He will be hydrated overnight,  discharged home in the morning.  He will be seen back in the office in two  weeks for followup.  He left the lab in stable condition.      JB/MEDQ  D:  03/07/2004  T:  03/07/2004  Job:  [id number]   cc:   [facility]  [address]   Osvaldo Shipper. Spruill, M.D.  [address] [address]  [place]  [place] [address]  Fax: [phone]   Madaline Savage, M.D.  [phone] [address]  [place]  [place] [address]  Fax: [phone]

## 2010-08-29 ENCOUNTER — Encounter (INDEPENDENT_AMBULATORY_CARE_PROVIDER_SITE_OTHER): Payer: Medicare Other

## 2010-08-29 ENCOUNTER — Encounter: Payer: Self-pay | Admitting: Vascular Surgery

## 2010-08-29 ENCOUNTER — Ambulatory Visit (INDEPENDENT_AMBULATORY_CARE_PROVIDER_SITE_OTHER): Payer: Medicare Other | Admitting: Vascular Surgery

## 2010-08-29 VITALS — BP 147/62 | HR 84 | Resp 20

## 2010-08-29 DIAGNOSIS — I739 Peripheral vascular disease, unspecified: Secondary | ICD-10-CM

## 2010-08-29 NOTE — Progress Notes (Signed)
Patient is an 77 year old male that we have followed for peripheral arterial disease. The left leg is worse on the right and overall he has been fairly stable since 2010. He previously had a right common iliac artery stent placed by Dr. Gery Pray in 2008. He denies any rest pain. He has no history of nonhealing ulcers. Claudication symptoms occur in the left calf at 75-100 yards. He had a recent dog bite in the left calf 3 days ago which he states is healing.  He has known chronic occlusion of the right common femoral and superficial femoral artery. He has heavy calcification of his entire arterial tree. He also has a chronic left superficial femoral artery occlusion.  He had bilateral ABIs performed today which I reviewed and interpreted. ABI on the left side was 0.29 which is essentially unchanged from March of 2012 ABI on the right side was 0.49 which is also unchanged  Review of systems:  He denies shortness of breath or chest pain. He has no skin rash.  Physical exam:  HEENT: Negative  Chest clear to auscultation  Cardiac: Regular rate and rhythm without murmur  Abdomen: Soft nontender nondistended no femoral pulses palpable  Extremities: 2 linear lacerations left lateral calf which are healing no erythema no discharge. The feet have no ulcers.  Assessment: Moderate to severe peripheral arterial disease with stable ABIs. The patient's revascularization options are fairly limited due to heavy calcification of his vessels. Currently he has claudication symptoms alone. I believe the best option is continued conservative management with risk factor modification and close observation of his feet. I do not believe a dog bite needs antibiotics at this time since it has not really changed in 3 days and appears to be healing. I discussed with the patient today the importance of returning for followup to make sure that his ABIs are not continued to decline. If he develops any ulcerations or open wound  on the CT may require revascularization reconsideration. Otherwise, he will followup in 6 months time with repeat ABIs and arterial duplex.

## 2010-08-29 NOTE — Progress Notes (Signed)
PVD F/U  LAB TODAY

## 2010-09-18 ENCOUNTER — Encounter: Payer: Self-pay | Admitting: Podiatrist

## 2010-12-03 LAB — CBC
Hemoglobin: 12.6 — ABNORMAL LOW
RBC: 4.19 — ABNORMAL LOW

## 2011-03-05 ENCOUNTER — Encounter: Payer: Self-pay | Admitting: Vascular Surgery

## 2011-03-06 ENCOUNTER — Encounter (INDEPENDENT_AMBULATORY_CARE_PROVIDER_SITE_OTHER): Payer: Medicare Other | Admitting: Vascular Surgery

## 2011-03-06 ENCOUNTER — Encounter: Payer: Self-pay | Admitting: Vascular Surgery

## 2011-03-06 ENCOUNTER — Ambulatory Visit (INDEPENDENT_AMBULATORY_CARE_PROVIDER_SITE_OTHER): Payer: Medicare Other | Admitting: Vascular Surgery

## 2011-03-06 VITALS — BP 198/66 | HR 74 | Resp 16 | Ht 59.0 in | Wt 136.0 lb

## 2011-03-06 DIAGNOSIS — I70219 Atherosclerosis of native arteries of extremities with intermittent claudication, unspecified extremity: Secondary | ICD-10-CM

## 2011-03-06 DIAGNOSIS — I739 Peripheral vascular disease, unspecified: Secondary | ICD-10-CM

## 2011-03-06 NOTE — Progress Notes (Signed)
Addended by: Phillips Odor on: 03/06/2011 03:39 PM   Modules accepted: Orders

## 2011-03-06 NOTE — Progress Notes (Signed)
The patient is an 78 year old male that I have followed for some time for claudication. He was last seen in July of 2012. He has known severe calcific occlusive disease in both lower extremities. History of right common iliac stent by Dr. Allyson Sabal. Prior CT Angio in 2010 showed occlusion of the right common femoral artery as well as severe calcific occlusion of the left superficial femoral artery. He had an attempt at a lower extremity arteriogram in March of 2012 which was aborted due to severely calcified vessels. He presents today for further followup. He currently states that he experiences claudication in his left lower extremity at one half block. He denies rest pain. He denies nonhealing ulcers. He previously had his greater saphenous vein harvested bilaterally for coronary bypass grafting.  Past Medical History  Diagnosis Date  . Diabetes mellitus   . Hypertension   . Hyperlipidemia   . CAD (coronary artery disease)   . Gastritis   . BPH (benign prostatic hypertrophy)   . Arthritis   . Claudication    Past Surgical History  Procedure Date  . Coronary artery bypass graft     in 1989 and 2001  . Tonsillectomy    Review of systems: Respiratory denies shortness of breath, cardiac he denies chest pain  Physical exam: Filed Vitals:   03/06/11 1442  BP: 198/66  Pulse: 74  Resp: 16  Height: 4\' 11"  (1.499 m)  Weight: 136 lb (61.689 kg)  SpO2: 98%   Neck no bruit  Chest: Clear to auscultation bilaterally  Cardiac: Regular rate and rhythm without murmur  Abdomen: Soft nontender no mass  Extremities absent femoral popliteal and pedal pulses bilaterally, 1-2+ left brachial pulse  Skin no ulcers or rashes  Data: He had bilateral lower extremity duplex and ABIs today which I reviewed and interpreted. ABI on the left side was 0.36 on the right was 0.44 waveforms were monophasic. He has severe occlusive disease which is severely calcified in the left superficial femoral right  superficial femoral right common femoral artery and tibial vessels as well  Assessment: Very short distance claudication. He currently does not have rest pain or tissue loss. Severe calcification of arteries  Plan: We will obtain a new CT angiogram of lower term a runoff. The patient has a history of slightly elevated creatinine of 1.6. He will need hydration prior to the procedure. He may require hospital admission prior to this. If he does not have a reasonable inflow or outflow target. We may have to consider deferring any intervention until he has more critical ischemia. Due to the severe calcification of his vessels. He may not be a revascularization candidate. He will followup with me after a CT scan.  Fabienne Bruns, MD [facility] Office: [phone] Pager: [phone]

## 2011-03-06 NOTE — Procedures (Unsigned)
LOWER EXTREMITY ARTERIAL DUPLEX  INDICATION:  Claudication of the lower extremities.  HISTORY: Diabetes:  Yes. Cardiac: Hypertension:  Yes. Smoking:  Previous. Previous Surgery:  History of stent placement, unknown dates and locations.  SINGLE LEVEL ARTERIAL EXAM                         RIGHT                LEFT Brachial: Anterior tibial: Posterior tibial: Peroneal: Ankle/Brachial Index:   0.44                 0.36 Toe/Brachial Index      0.30                 0.21  PREVIOUS ANKLE BRACHIAL INDEX:  Date:  08/29/2010  Right:  0.49  Left: 0.29  LOWER EXTREMITY ARTERIAL DUPLEX EXAM  DUPLEX: 1. Dense calcific plaque present throughout the bilateral lower     extremities arterial system. 2. Bilateral superficial femoral artery occlusions with reconstitution     of flow at the distal segments. 3. Bilateral anterior tibial artery occlusions.  IMPRESSION: 1. Native artery occlusions present, as noted above. 2. Arterial inflow disease present bilaterally. 3. Ankle brachial indices are in the severe claudication range. 4. Toe brachial indices are in the critical ischemia range. 5. Ankle brachial indices are unchanged since previous study on     08/29/2010.      ___________________________________________ Janetta Hora. Fields, MD  SH/MEDQ  D:  03/06/2011  T:  03/06/2011  Job:  [id number]

## 2011-03-10 ENCOUNTER — Ambulatory Visit
Admission: RE | Admit: 2011-03-10 | Discharge: 2011-03-10 | Disposition: A | Discharge status: Home / Self Care | Payer: Medicare Other | Source: Ambulatory Visit | Attending: Vascular Surgery | Admitting: Vascular Surgery

## 2011-03-10 DIAGNOSIS — I739 Peripheral vascular disease, unspecified: Secondary | ICD-10-CM

## 2011-03-10 MED ORDER — IOHEXOL 350 MG/ML SOLN
125.0000 mL | Freq: Once | INTRAVENOUS | Status: AC | PRN
  Administered 2011-03-10: 125 mL via INTRAVENOUS

## 2011-03-27 ENCOUNTER — Encounter: Payer: Self-pay | Admitting: Vascular Surgery

## 2011-03-27 ENCOUNTER — Ambulatory Visit (INDEPENDENT_AMBULATORY_CARE_PROVIDER_SITE_OTHER): Payer: Medicare Other | Admitting: Vascular Surgery

## 2011-03-27 VITALS — BP 152/68 | HR 72 | Resp 16 | Ht 64.0 in | Wt 139.0 lb

## 2011-03-27 DIAGNOSIS — I739 Peripheral vascular disease, unspecified: Secondary | ICD-10-CM

## 2011-03-27 NOTE — Progress Notes (Signed)
Patient is an 78 year old male with known progressive arterial occlusive disease of his lower extremities. He returns today for followup regarding his CT angiogram with runoff. Of note he did have a slightly elevated creatinine prior to the exam we were able to perform the exam however.   He currently has symptoms of rest pain in his feet. This is become slowly progressively worse. He does not have any ulcerations or open wounds on his feet.   Findings of the CT angiogram were discussed with him today.   Aortoiliac segment: He has had some atrophy of his left kidney from 10.6-8.5 cm in length. He has a 50% stenosis of the celiac axis he has a 50% stenosis of the superior mesenteric artery he has a 50-60% stenosis of the right renal artery he has subtotal occlusion of the left renal artery. These are all slightly progressive from his prior CT scan of 2010. The abdominal aorta is patent. The vessels are severely calcified.  Right lower extremity: His right common iliac stent has had some progressive stenosis. His right common femoral artery is occluded. His right superficial femoral and popliteal artery is severely diseased and subtotally occluded. His right below-knee popliteal artery is patent. He has posterior tibial and peroneal runoff to the right foot.  Left lower extremity: His left common iliac artery has an 80% stenosis. His left external iliac artery is a 60-70% stenosis. His left internal iliac artery is occluded. His left common femoral artery is occluded. His left superficial femoral artery is occluded. His left popliteal artery is occluded. He has posterior tibial and peroneal runoff to the left foot.  Review of systems: He denies shortness of breath or chest pain today    Physical exam: Filed Vitals:   03/27/11 1040  BP: 152/68  Pulse: 72  Resp: 16  Height: 5\' 4"  (1.626 m)  Weight: 139 lb (63.05 kg)  SpO2: 98%    Extremities: No open ulcers, 2+ brachial and radial pulse  bilaterally, absent femoral and pedal pulses  Cardiac: Regular rate and rhythm  Chest: Clear to auscultation  Review of most recent ABIs were 0.29 on the left 0.47 on the right  Assessment: Severe peripheral arterial occlusive disease. Due to the patient's age and the calcification of his abdominal aorta I do not believe he is a candidate for aortobifemoral bypass graft. The next option would be to do a right axillary bifemoral bypass with consideration to adding a lower extremity bypass to this at the same time. I discussed with the patient today risks benefits possible complications and procedure details of this including bleeding infection limb loss. He had significant concerns about his renal function as well. I discussed with him that I thought the legs were the more pressing issue at this time and this operation would do nothing to revascularize his kidneys. We could consider evaluating this further at some point in the future. He has a stress test scheduled with Dr. Verdis Prime on February 5. He also has an appt scheduled with his primary care physician on February 8. He will call if he wishes to proceed with axillary bifemoral bypass. He has extensive common femoral disease bilaterally and would require extensive endarterectomies or possible bypass of these and the profunda as well.  Fabienne Bruns, MD [facility] [place] Office: [phone] Pager: [phone]

## 2011-04-15 ENCOUNTER — Telehealth: Payer: Self-pay | Admitting: *Deleted

## 2011-04-15 NOTE — Telephone Encounter (Signed)
Called pt to tell him we had OK from cardiology to go ahead with Ax-Fem BPG. However,[name] does not want to set surgery up and said he would call usvwhen he was ready.

## 2011-04-28 ENCOUNTER — Encounter: Payer: Self-pay | Admitting: *Deleted

## 2011-04-28 ENCOUNTER — Other Ambulatory Visit: Payer: Self-pay | Admitting: *Deleted

## 2011-05-02 ENCOUNTER — Encounter (HOSPITAL_COMMUNITY): Payer: Self-pay

## 2011-05-02 ENCOUNTER — Encounter (HOSPITAL_COMMUNITY)
Admission: RE | Admit: 2011-05-02 | Discharge: 2011-05-02 | Disposition: A | Discharge status: Home / Self Care | Payer: Medicare Other | Source: Ambulatory Visit | Attending: Vascular Surgery | Admitting: Vascular Surgery

## 2011-05-02 ENCOUNTER — Encounter (HOSPITAL_COMMUNITY)
Admission: RE | Admit: 2011-05-02 | Discharge: 2011-05-02 | Disposition: A | Discharge status: Home / Self Care | Payer: Medicare Other | Source: Ambulatory Visit | Attending: Anesthesiology | Admitting: Anesthesiology

## 2011-05-02 HISTORY — DX: Insomnia, unspecified: G47.00

## 2011-05-02 HISTORY — DX: Other skin changes: R23.8

## 2011-05-02 HISTORY — DX: Spontaneous ecchymoses: R23.3

## 2011-05-02 HISTORY — DX: Personal history of colonic polyps: Z86.010

## 2011-05-02 HISTORY — DX: Noninfective gastroenteritis and colitis, unspecified: K52.9

## 2011-05-02 HISTORY — DX: Peripheral vascular disease, unspecified: I73.9

## 2011-05-02 HISTORY — DX: Personal history of other infectious and parasitic diseases: Z86.19

## 2011-05-02 HISTORY — DX: Personal history of colon polyps, unspecified: Z86.0100

## 2011-05-02 HISTORY — DX: Urgency of urination: R39.15

## 2011-05-02 HISTORY — DX: Cardiac murmur, unspecified: R01.1

## 2011-05-02 HISTORY — DX: Mental disorder, not otherwise specified: F99

## 2011-05-02 LAB — CBC
MCH: 30.9 pg (ref 26.0–34.0)
MCHC: 34.3 g/dL (ref 30.0–36.0)
MCV: 89.9 fL (ref 78.0–100.0)
Platelets: 134 10*3/uL — ABNORMAL LOW (ref 150–400)

## 2011-05-02 LAB — ABO/RH: ABO/RH(D): A POS

## 2011-05-02 LAB — COMPREHENSIVE METABOLIC PANEL
ALT: 19 U/L (ref 0–53)
Calcium: 8.8 mg/dL (ref 8.4–10.5)
Creatinine, Ser: 1.49 mg/dL — ABNORMAL HIGH (ref 0.50–1.35)
GFR calc Af Amer: 48 mL/min — ABNORMAL LOW (ref >90)
Glucose, Bld: 105 mg/dL — ABNORMAL HIGH (ref 70–99)
Sodium: 141 mEq/L (ref 135–145)
Total Protein: 6.2 g/dL (ref 6.0–8.3)

## 2011-05-02 LAB — URINALYSIS, ROUTINE W REFLEX MICROSCOPIC
Leukocytes, UA: NEGATIVE
Nitrite: NEGATIVE
Specific Gravity, Urine: 1.016 (ref 1.005–1.030)
Urobilinogen, UA: 0.2 mg/dL (ref 0.0–1.0)
pH: 5.5 (ref 5.0–8.0)

## 2011-05-02 LAB — PROTIME-INR: Prothrombin Time: 15.7 seconds — ABNORMAL HIGH (ref 11.6–15.2)

## 2011-05-02 LAB — DIFFERENTIAL
Basophils Absolute: 0 10*3/uL (ref 0.0–0.1)
Eosinophils Relative: 6 % — ABNORMAL HIGH (ref 0–5)
Lymphocytes Relative: 29 % (ref 12–46)
Lymphs Abs: 0.9 10*3/uL (ref 0.7–4.0)
Neutro Abs: 1.6 10*3/uL — ABNORMAL LOW (ref 1.7–7.7)

## 2011-05-02 LAB — SURGICAL PCR SCREEN
MRSA, PCR: NEGATIVE
Staphylococcus aureus: NEGATIVE

## 2011-05-02 NOTE — Progress Notes (Signed)
Primary MD Dr.Edwin Concepcion Elk

## 2011-05-02 NOTE — Pre-Procedure Instructions (Signed)
20 [name].  05/02/2011   Your procedure is scheduled on:  Mon, Mar 11 @ 0730  Report to [facility] at 0530 AM.  Call this number if you have problems the morning of surgery: [phone]   Remember:   Do not eat food:After Midnight.  May have clear liquids: up to 4 Hours before arrival.(until 1:30 am)  Clear liquids include soda, tea, black coffee, apple or grape juice, broth.  Take these medicines the morning of surgery with A SIP OF WATER: Aricept,Metoprolol,and Flomax   Do not wear jewelry, make-up or nail polish.  Do not wear lotions, powders, or perfumes. You may wear deodorant.  Do not shave 48 hours prior to surgery.  Do not bring valuables to the hospital.  Contacts, dentures or bridgework may not be worn into surgery.  Leave suitcase in the car. After surgery it may be brought to your room.  For patients admitted to the hospital, checkout time is 11:00 AM the day of discharge.   Patients discharged the day of surgery will not be allowed to drive home.    Special Instructions: CHG Shower Use Special Wash: 1/2 bottle night before surgery and 1/2 bottle morning of surgery.   Please read over the following fact sheets that you were given: Pain Booklet, Coughing and Deep Breathing, Blood Transfusion Information, MRSA Information and Surgical Site Infection Prevention

## 2011-05-02 NOTE — Consult Note (Signed)
Anesthesia:  Patient is a 78 year old male scheduled for a AFBG on 05/05/11.  His PAT appointment was earlier today Friday 05/02/11.  History includes HLD, gastritis, arthritis, HTN, PVD, borderline DM, CAD s/p CABG '88, CABG/AVR (bioprothetic valve) '05, BPH, asthma, murmur, blindness, "mental disorder" on Aricept.  Labs acceptable.  CXR shows no acute findings.  EKG from 05/24/10 shows NSR, right BBB, inferior infarct.  EKG form 04/02/11 from [place] also has similar findings.    He was evaluated by Dr. Verdis Prime preoperatively on 03/26/11.  He had a low risk stress test showing mild perfusion defect seen in the basal inferoseptal, basal inferior and mid inferoseptal regions consistent with infarct/scar.  There was no inducible myocardial ischemia.  EF 64%.  It appears he had previously been followed at [facility] (Dr. Elsie Lincoln) with last echo done 03/13/06 showing EF 50-55%, mild LVH, LV systolic function low normal, mild to moderate inferior wall hypokinesis, mild MR, mod TR, trace AR with prosthetic AV well-seated, trace PR, there was aortic root sclerosis/calcificaiton.    Plan to proceed if no significant change in his status.

## 2011-05-02 NOTE — Progress Notes (Signed)
Cardiologist is Dr.Henry Smith-requested last office visit,stress test,and any other records they may have  Multiple heart caths in epic

## 2011-05-02 NOTE — Progress Notes (Signed)
Sleep study in epic from 1/06-has never required CPAP/BIPAP

## 2011-05-04 MED ORDER — DEXTROSE 5 % IV SOLN
1.5000 g | INTRAVENOUS | Status: DC
  Filled 2011-05-04: qty 1.5

## 2011-05-05 ENCOUNTER — Ambulatory Visit (HOSPITAL_COMMUNITY): Payer: Medicare Other | Admitting: Vascular Surgery

## 2011-05-05 ENCOUNTER — Inpatient Hospital Stay (HOSPITAL_COMMUNITY)
Admission: RE | Admit: 2011-05-05 | Discharge: 2011-05-13 | DRG: 252 | Disposition: A | Discharge status: Skilled Nursing Facility | Payer: Medicare Other | Source: Ambulatory Visit | Attending: Vascular Surgery | Admitting: Vascular Surgery

## 2011-05-05 ENCOUNTER — Encounter (HOSPITAL_COMMUNITY): Payer: Self-pay | Admitting: Vascular Surgery

## 2011-05-05 ENCOUNTER — Inpatient Hospital Stay (HOSPITAL_COMMUNITY): Payer: Medicare Other

## 2011-05-05 ENCOUNTER — Encounter (HOSPITAL_COMMUNITY): Payer: Self-pay | Admitting: *Deleted

## 2011-05-05 ENCOUNTER — Encounter (HOSPITAL_COMMUNITY)
Admission: RE | Disposition: A | Discharge status: Skilled Nursing Facility | Payer: Self-pay | Source: Ambulatory Visit | Attending: Vascular Surgery

## 2011-05-05 DIAGNOSIS — J9600 Acute respiratory failure, unspecified whether with hypoxia or hypercapnia: Secondary | ICD-10-CM

## 2011-05-05 DIAGNOSIS — I2510 Atherosclerotic heart disease of native coronary artery without angina pectoris: Secondary | ICD-10-CM | POA: Diagnosis present

## 2011-05-05 DIAGNOSIS — I5032 Chronic diastolic (congestive) heart failure: Secondary | ICD-10-CM | POA: Diagnosis present

## 2011-05-05 DIAGNOSIS — E872 Acidosis, unspecified: Secondary | ICD-10-CM | POA: Diagnosis not present

## 2011-05-05 DIAGNOSIS — I70219 Atherosclerosis of native arteries of extremities with intermittent claudication, unspecified extremity: Secondary | ICD-10-CM

## 2011-05-05 DIAGNOSIS — D62 Acute posthemorrhagic anemia: Secondary | ICD-10-CM | POA: Diagnosis not present

## 2011-05-05 DIAGNOSIS — I7092 Chronic total occlusion of artery of the extremities: Secondary | ICD-10-CM | POA: Diagnosis present

## 2011-05-05 DIAGNOSIS — I214 Non-ST elevation (NSTEMI) myocardial infarction: Secondary | ICD-10-CM | POA: Diagnosis not present

## 2011-05-05 DIAGNOSIS — I10 Essential (primary) hypertension: Secondary | ICD-10-CM | POA: Diagnosis present

## 2011-05-05 DIAGNOSIS — Z7982 Long term (current) use of aspirin: Secondary | ICD-10-CM

## 2011-05-05 DIAGNOSIS — I743 Embolism and thrombosis of arteries of the lower extremities: Secondary | ICD-10-CM

## 2011-05-05 DIAGNOSIS — I739 Peripheral vascular disease, unspecified: Secondary | ICD-10-CM

## 2011-05-05 DIAGNOSIS — E861 Hypovolemia: Secondary | ICD-10-CM | POA: Diagnosis present

## 2011-05-05 DIAGNOSIS — N400 Enlarged prostate without lower urinary tract symptoms: Secondary | ICD-10-CM | POA: Diagnosis present

## 2011-05-05 DIAGNOSIS — E875 Hyperkalemia: Secondary | ICD-10-CM | POA: Diagnosis not present

## 2011-05-05 DIAGNOSIS — G4700 Insomnia, unspecified: Secondary | ICD-10-CM | POA: Diagnosis present

## 2011-05-05 DIAGNOSIS — D696 Thrombocytopenia, unspecified: Secondary | ICD-10-CM | POA: Diagnosis not present

## 2011-05-05 DIAGNOSIS — Y9210 Unspecified residential institution as the place of occurrence of the external cause: Secondary | ICD-10-CM | POA: Clinically undetermined

## 2011-05-05 DIAGNOSIS — IMO0002 Reserved for concepts with insufficient information to code with codable children: Secondary | ICD-10-CM | POA: Diagnosis not present

## 2011-05-05 DIAGNOSIS — I509 Heart failure, unspecified: Secondary | ICD-10-CM

## 2011-05-05 DIAGNOSIS — Z01812 Encounter for preprocedural laboratory examination: Secondary | ICD-10-CM

## 2011-05-05 DIAGNOSIS — Z951 Presence of aortocoronary bypass graft: Secondary | ICD-10-CM

## 2011-05-05 DIAGNOSIS — J45909 Unspecified asthma, uncomplicated: Secondary | ICD-10-CM | POA: Diagnosis present

## 2011-05-05 DIAGNOSIS — Y849 Medical procedure, unspecified as the cause of abnormal reaction of the patient, or of later complication, without mention of misadventure at the time of the procedure: Secondary | ICD-10-CM | POA: Diagnosis not present

## 2011-05-05 DIAGNOSIS — E119 Type 2 diabetes mellitus without complications: Secondary | ICD-10-CM | POA: Diagnosis present

## 2011-05-05 DIAGNOSIS — R570 Cardiogenic shock: Secondary | ICD-10-CM | POA: Diagnosis not present

## 2011-05-05 HISTORY — PX: AXILLARY-FEMORAL BYPASS GRAFT: SHX894

## 2011-05-05 HISTORY — PX: FEMORAL-POPLITEAL BYPASS GRAFT: SHX937

## 2011-05-05 LAB — POCT I-STAT 3, ART BLOOD GAS (G3+)
Bicarbonate: 17.5 mEq/L — ABNORMAL LOW (ref 20.0–24.0)
Bicarbonate: 11.8 mEq/L — ABNORMAL LOW (ref 20.0–24.0)
O2 Saturation: 99 %
O2 Saturation: 99 %
Patient temperature: 37
Patient temperature: 97.4
TCO2: 13 mmol/L (ref 0–100)
pCO2 arterial: 22.6 mmHg — ABNORMAL LOW (ref 35.0–45.0)
pH, Arterial: 7.291 — ABNORMAL LOW (ref 7.350–7.450)
pH, Arterial: 7.236 — ABNORMAL LOW (ref 7.350–7.450)
pO2, Arterial: 137 mmHg — ABNORMAL HIGH (ref 80.0–100.0)

## 2011-05-05 LAB — CBC
HCT: 18.1 % — ABNORMAL LOW (ref 39.0–52.0)
Platelets: 56 10*3/uL — ABNORMAL LOW (ref 150–400)
RDW: 14.7 % (ref 11.5–15.5)
WBC: 7.8 10*3/uL (ref 4.0–10.5)

## 2011-05-05 LAB — GLUCOSE, CAPILLARY: Glucose-Capillary: 297 mg/dL — ABNORMAL HIGH (ref 70–99)

## 2011-05-05 LAB — BASIC METABOLIC PANEL
GFR calc Af Amer: 67 mL/min — ABNORMAL LOW (ref >90)
GFR calc non Af Amer: 58 mL/min — ABNORMAL LOW (ref >90)
Potassium: 4 mEq/L (ref 3.5–5.1)
Sodium: 140 mEq/L (ref 135–145)

## 2011-05-05 LAB — CARDIAC PANEL(CRET KIN+CKTOT+MB+TROPI): Troponin I: <0.3 ng/mL (ref <0.30)

## 2011-05-05 LAB — COMPREHENSIVE METABOLIC PANEL
ALT: 5 U/L (ref 0–53)
Albumin: 1.8 g/dL — ABNORMAL LOW (ref 3.5–5.2)
Alkaline Phosphatase: 22 U/L — ABNORMAL LOW (ref 39–117)
BUN: 12 mg/dL (ref 6–23)
Potassium: 4 mEq/L (ref 3.5–5.1)
Sodium: 140 mEq/L (ref 135–145)
Total Protein: 2.6 g/dL — ABNORMAL LOW (ref 6.0–8.3)

## 2011-05-05 LAB — PREPARE RBC (CROSSMATCH)

## 2011-05-05 SURGERY — CREATION, BYPASS, ARTERIAL, AXILLARY TO BILATERAL FEMORAL, USING GRAFT
Anesthesia: General | Site: Leg Upper | Laterality: Right | Wound class: Clean

## 2011-05-05 MED ORDER — SODIUM CHLORIDE 0.9 % IV SOLN: INTRAVENOUS | Status: DC

## 2011-05-05 MED ORDER — EPHEDRINE SULFATE 50 MG/ML IJ SOLN
INTRAMUSCULAR | Status: DC | PRN
  Administered 2011-05-05 (×2): 5 mg via INTRAVENOUS
  Administered 2011-05-05: 10 mg via INTRAVENOUS
  Administered 2011-05-05 (×2): 5 mg via INTRAVENOUS

## 2011-05-05 MED ORDER — INSULIN ASPART 100 UNIT/ML ~~LOC~~ SOLN: 0.0000 [IU] | SUBCUTANEOUS | Status: DC

## 2011-05-05 MED ORDER — HEPARIN SODIUM (PORCINE) 1000 UNIT/ML IJ SOLN
INTRAMUSCULAR | Status: DC | PRN
  Administered 2011-05-05: 8000 [IU] via INTRAVENOUS
  Administered 2011-05-05 (×2): 5000 [IU] via INTRAVENOUS
  Administered 2011-05-05: 8000 [IU] via INTRAVENOUS

## 2011-05-05 MED ORDER — NOREPINEPHRINE BITARTRATE 1 MG/ML IJ SOLN
2.0000 ug/min | INTRAVENOUS | Status: AC
  Administered 2011-05-05: 5 ug/min via INTRAVENOUS
  Filled 2011-05-05: qty 4

## 2011-05-05 MED ORDER — SODIUM CHLORIDE 0.9 % IV SOLN
INTRAVENOUS | Status: DC | PRN
  Administered 2011-05-05: 13:00:00 via INTRAVENOUS

## 2011-05-05 MED ORDER — SODIUM CHLORIDE 0.9 % IV BOLUS (SEPSIS)
1000.0000 mL | Freq: Once | INTRAVENOUS | Status: AC
  Administered 2011-05-05: 1000 mL via INTRAVENOUS

## 2011-05-05 MED ORDER — FENTANYL CITRATE 0.05 MG/ML IJ SOLN
25.0000 ug | INTRAMUSCULAR | Status: DC | PRN
  Administered 2011-05-05: 50 ug via INTRAVENOUS
  Filled 2011-05-05: qty 2

## 2011-05-05 MED ORDER — DEXTROSE 5 % IV SOLN
0.5000 ug/min | INTRAVENOUS | Status: DC
  Filled 2011-05-05: qty 4

## 2011-05-05 MED ORDER — LACTATED RINGERS IV SOLN
INTRAVENOUS | Status: DC | PRN
  Administered 2011-05-05 (×3): via INTRAVENOUS

## 2011-05-05 MED ORDER — DOPAMINE-DEXTROSE 3.2-5 MG/ML-% IV SOLN: 3.0000 ug/kg/min | INTRAVENOUS | Status: DC

## 2011-05-05 MED ORDER — SODIUM CHLORIDE 0.9 % IV SOLN
10.0000 mg | INTRAVENOUS | Status: DC | PRN
  Administered 2011-05-05: 50 ug/min via INTRAVENOUS
  Administered 2011-05-05: 200 ug/min via INTRAVENOUS

## 2011-05-05 MED ORDER — POTASSIUM CHLORIDE CRYS ER 20 MEQ PO TBCR: 20.0000 meq | EXTENDED_RELEASE_TABLET | Freq: Once | ORAL | Status: AC | PRN

## 2011-05-05 MED ORDER — MIDAZOLAM HCL 2 MG/2ML IJ SOLN
1.0000 mg | INTRAMUSCULAR | Status: DC | PRN
  Administered 2011-05-05 – 2011-05-06 (×8): 2 mg via INTRAVENOUS
  Administered 2011-05-07: 1 mg via INTRAVENOUS
  Filled 2011-05-05 (×8): qty 2

## 2011-05-05 MED ORDER — GUAIFENESIN-DM 100-10 MG/5ML PO SYRP: 15.0000 mL | ORAL_SOLUTION | ORAL | Status: DC | PRN

## 2011-05-05 MED ORDER — PHENOL 1.4 % MT LIQD: 1.0000 | OROMUCOSAL | Status: DC | PRN

## 2011-05-05 MED ORDER — DEXTROSE 10 % IV SOLN: INTRAVENOUS | Status: DC

## 2011-05-05 MED ORDER — DEXTROSE 5 % IV SOLN
INTRAVENOUS | Status: DC
  Administered 2011-05-05: 18:00:00 via INTRAVENOUS
  Filled 2011-05-05 (×3): qty 150

## 2011-05-05 MED ORDER — SODIUM BICARBONATE 8.4 % IV SOLN
100.0000 meq | Freq: Once | INTRAVENOUS | Status: AC
  Administered 2011-05-05: 100 meq via INTRAVENOUS
  Filled 2011-05-05: qty 100

## 2011-05-05 MED ORDER — DEXTROSE 5 % IV SOLN
1.5000 g | Freq: Two times a day (BID) | INTRAVENOUS | Status: AC
  Administered 2011-05-05 – 2011-05-06 (×2): 1.5 g via INTRAVENOUS
  Filled 2011-05-05 (×2): qty 1.5

## 2011-05-05 MED ORDER — ALBUMIN HUMAN 5 % IV SOLN
INTRAVENOUS | Status: DC | PRN
  Administered 2011-05-05: 16:00:00 via INTRAVENOUS

## 2011-05-05 MED ORDER — MAGNESIUM SULFATE 40 MG/ML IJ SOLN
2.0000 g | Freq: Once | INTRAMUSCULAR | Status: AC | PRN
  Administered 2011-05-05: 2 g via INTRAVENOUS
  Filled 2011-05-05: qty 100

## 2011-05-05 MED ORDER — FAMOTIDINE IN NACL 20-0.9 MG/50ML-% IV SOLN: 20.0000 mg | Freq: Two times a day (BID) | INTRAVENOUS | Status: DC

## 2011-05-05 MED ORDER — 0.9 % SODIUM CHLORIDE (POUR BTL) OPTIME
TOPICAL | Status: DC | PRN
  Administered 2011-05-05: 2000 mL

## 2011-05-05 MED ORDER — SODIUM CHLORIDE 0.9 % IR SOLN
Status: DC | PRN
  Administered 2011-05-05: 08:00:00

## 2011-05-05 MED ORDER — HYDRALAZINE HCL 20 MG/ML IJ SOLN
10.0000 mg | INTRAMUSCULAR | Status: DC | PRN
  Filled 2011-05-05: qty 0.5

## 2011-05-05 MED ORDER — HETASTARCH-ELECTROLYTES 6 % IV SOLN
INTRAVENOUS | Status: DC | PRN
  Administered 2011-05-05: 14:00:00 via INTRAVENOUS

## 2011-05-05 MED ORDER — INSULIN ASPART 100 UNIT/ML ~~LOC~~ SOLN
0.0000 [IU] | SUBCUTANEOUS | Status: DC
  Administered 2011-05-05 – 2011-05-06 (×3): 3 [IU] via SUBCUTANEOUS
  Administered 2011-05-06 – 2011-05-07 (×2): 1 [IU] via SUBCUTANEOUS

## 2011-05-05 MED ORDER — SODIUM BICARBONATE 8.4 % IV SOLN
50.0000 meq | Freq: Once | INTRAVENOUS | Status: AC
  Administered 2011-05-05: 50 meq via INTRAVENOUS

## 2011-05-05 MED ORDER — DEXTROSE-NACL 5-0.9 % IV SOLN
INTRAVENOUS | Status: DC
  Administered 2011-05-06: 01:00:00 via INTRAVENOUS

## 2011-05-05 MED ORDER — EPINEPHRINE HCL 1 MG/ML IJ SOLN
0.5000 ug/min | INTRAVENOUS | Status: DC
  Administered 2011-05-05: 6 ug/min via INTRAVENOUS
  Filled 2011-05-05: qty 1

## 2011-05-05 MED ORDER — METOPROLOL TARTRATE 1 MG/ML IV SOLN: 2.0000 mg | INTRAVENOUS | Status: DC | PRN

## 2011-05-05 MED ORDER — ONDANSETRON HCL 4 MG/2ML IJ SOLN: 4.0000 mg | Freq: Four times a day (QID) | INTRAMUSCULAR | Status: DC | PRN

## 2011-05-05 MED ORDER — NOREPINEPHRINE BITARTRATE 1 MG/ML IJ SOLN
2.0000 ug/min | INTRAMUSCULAR | Status: DC
  Administered 2011-05-05: 30 ug/min via INTRAVENOUS
  Administered 2011-05-05: 25 ug/min via INTRAVENOUS
  Administered 2011-05-05: 20 ug/min via INTRAVENOUS
  Administered 2011-05-06: 10 ug/min via INTRAVENOUS
  Administered 2011-05-06: 20 ug/min via INTRAVENOUS
  Filled 2011-05-05 (×6): qty 4

## 2011-05-05 MED ORDER — THROMBIN 20000 UNITS EX KIT
PACK | CUTANEOUS | Status: DC | PRN
  Administered 2011-05-05 (×2): via TOPICAL

## 2011-05-05 MED ORDER — ACETAMINOPHEN 325 MG PO TABS
325.0000 mg | ORAL_TABLET | ORAL | Status: DC | PRN
Start: 2011-05-05 — End: 2011-05-13
  Administered 2011-05-12: 650 mg via ORAL
  Filled 2011-05-05 (×3): qty 2

## 2011-05-05 MED ORDER — FENTANYL CITRATE 0.05 MG/ML IJ SOLN
INTRAMUSCULAR | Status: DC | PRN
  Administered 2011-05-05 (×2): 100 ug via INTRAVENOUS
  Administered 2011-05-05 (×3): 50 ug via INTRAVENOUS

## 2011-05-05 MED ORDER — DOCUSATE SODIUM 100 MG PO CAPS
100.0000 mg | ORAL_CAPSULE | Freq: Every day | ORAL | Status: DC
  Administered 2011-05-06 – 2011-05-13 (×8): 100 mg via ORAL
  Filled 2011-05-05 (×8): qty 1

## 2011-05-05 MED ORDER — ACETAMINOPHEN 650 MG RE SUPP: 325.0000 mg | RECTAL | Status: DC | PRN

## 2011-05-05 MED ORDER — LABETALOL HCL 5 MG/ML IV SOLN: 10.0000 mg | INTRAVENOUS | Status: DC | PRN

## 2011-05-05 MED ORDER — METOPROLOL TARTRATE 1 MG/ML IV SOLN
5.0000 mg | Freq: Four times a day (QID) | INTRAVENOUS | Status: DC
  Filled 2011-05-05 (×4): qty 5

## 2011-05-05 MED ORDER — SODIUM BICARBONATE 8.4 % IV SOLN
INTRAVENOUS | Status: AC
  Filled 2011-05-05: qty 100

## 2011-05-05 MED ORDER — HYDROMORPHONE HCL PF 1 MG/ML IJ SOLN
0.5000 mg | INTRAMUSCULAR | Status: DC | PRN
  Administered 2011-05-07 – 2011-05-09 (×4): 0.5 mg via INTRAVENOUS
  Filled 2011-05-05 (×5): qty 1

## 2011-05-05 MED ORDER — METOPROLOL TARTRATE 1 MG/ML IV SOLN
INTRAVENOUS | Status: DC | PRN
  Administered 2011-05-05: 1 mg via INTRAVENOUS

## 2011-05-05 MED ORDER — SODIUM CHLORIDE 0.9 % IV SOLN
500.0000 mL | Freq: Once | INTRAVENOUS | Status: AC | PRN
  Administered 2011-05-05: 500 mL via INTRAVENOUS

## 2011-05-05 MED ORDER — PROPOFOL 10 MG/ML IV EMUL
INTRAVENOUS | Status: DC | PRN
  Administered 2011-05-05: 150 mg via INTRAVENOUS
  Administered 2011-05-05: 30 mg via INTRAVENOUS
  Administered 2011-05-05: 50 mg via INTRAVENOUS

## 2011-05-05 MED ORDER — SODIUM CHLORIDE 0.9 % IV SOLN
10.0000 ug/h | INTRAVENOUS | Status: DC
  Administered 2011-05-05: 20 ug/h via INTRAVENOUS
  Administered 2011-05-07: 70 ug/h via INTRAVENOUS
  Filled 2011-05-05 (×2): qty 50

## 2011-05-05 MED ORDER — DOPAMINE-DEXTROSE 3.2-5 MG/ML-% IV SOLN
INTRAVENOUS | Status: DC | PRN
  Administered 2011-05-05: 8 ug/kg/min via INTRAVENOUS

## 2011-05-05 MED ORDER — DOPAMINE-DEXTROSE 3.2-5 MG/ML-% IV SOLN
3.0000 ug/kg/min | INTRAVENOUS | Status: DC
  Administered 2011-05-05: 8 ug/kg/min via INTRAVENOUS

## 2011-05-05 MED ORDER — CALCIUM CHLORIDE 10 % IV SOLN
INTRAVENOUS | Status: DC | PRN
  Administered 2011-05-05: 1 g via INTRAVENOUS

## 2011-05-05 MED ORDER — MIDAZOLAM HCL 2 MG/2ML IJ SOLN
INTRAMUSCULAR | Status: AC
  Filled 2011-05-05: qty 2

## 2011-05-05 MED ORDER — HYDROMORPHONE HCL PF 1 MG/ML IJ SOLN: 0.2500 mg | INTRAMUSCULAR | Status: DC | PRN

## 2011-05-05 MED ORDER — HEPARIN SODIUM (PORCINE) 5000 UNIT/ML IJ SOLN
5000.0000 [IU] | Freq: Three times a day (TID) | INTRAMUSCULAR | Status: DC
  Filled 2011-05-05 (×2): qty 1

## 2011-05-05 MED ORDER — VECURONIUM BROMIDE 10 MG IV SOLR
INTRAVENOUS | Status: DC | PRN
  Administered 2011-05-05: 4 mg via INTRAVENOUS
  Administered 2011-05-05: 6 mg via INTRAVENOUS

## 2011-05-05 MED ORDER — PANTOPRAZOLE SODIUM 40 MG IV SOLR
40.0000 mg | INTRAVENOUS | Status: DC
  Administered 2011-05-05 – 2011-05-07 (×3): 40 mg via INTRAVENOUS
  Filled 2011-05-05 (×5): qty 40

## 2011-05-05 MED ORDER — SODIUM BICARBONATE 4.2 % IV SOLN
INTRAVENOUS | Status: DC | PRN
  Administered 2011-05-05: 50 meq via INTRAVENOUS

## 2011-05-05 SURGICAL SUPPLY — 71 items
ADH SKN CLS APL DERMABOND .7 (GAUZE/BANDAGES/DRESSINGS)
ADH SKN CLS LQ APL DERMABOND (GAUZE/BANDAGES/DRESSINGS) ×16
CANISTER SUCTION 2500CC (MISCELLANEOUS) ×5 IMPLANT
CANNULA VESSEL W/WING WO/VALVE (CANNULA) ×1 IMPLANT
CATH EMB 4FR 80CM (CATHETERS) ×1 IMPLANT
CLIP TI MEDIUM 24 (CLIP) ×5 IMPLANT
CLIP TI WIDE RED SMALL 24 (CLIP) ×5 IMPLANT
CLOTH BEACON ORANGE TIMEOUT ST (SAFETY) ×5 IMPLANT
COVER SURGICAL LIGHT HANDLE (MISCELLANEOUS) ×10 IMPLANT
DERMABOND ADHESIVE PROPEN (GAUZE/BANDAGES/DRESSINGS) ×4
DERMABOND ADVANCED (GAUZE/BANDAGES/DRESSINGS)
DERMABOND ADVANCED .7 DNX12 (GAUZE/BANDAGES/DRESSINGS) IMPLANT
DERMABOND ADVANCED .7 DNX6 (GAUZE/BANDAGES/DRESSINGS) IMPLANT
DRAIN SNY 10X20 3/4 PERF (WOUND CARE) IMPLANT
DRAPE INCISE IOBAN 66X45 STRL (DRAPES) ×5 IMPLANT
DRAPE WARM FLUID 44X44 (DRAPE) ×5 IMPLANT
DRSG COVADERM 4X10 (GAUZE/BANDAGES/DRESSINGS) IMPLANT
DRSG COVADERM 4X6 (GAUZE/BANDAGES/DRESSINGS) ×3 IMPLANT
DRSG COVADERM 4X8 (GAUZE/BANDAGES/DRESSINGS) ×1 IMPLANT
ELECT CAUTERY BLADE 6.4 (BLADE) ×1 IMPLANT
ELECT REM PT RETURN 9FT ADLT (ELECTROSURGICAL) ×5
ELECTRODE REM PT RTRN 9FT ADLT (ELECTROSURGICAL) ×4 IMPLANT
EVACUATOR SILICONE 100CC (DRAIN) IMPLANT
GAUZE SPONGE 4X4 16PLY XRAY LF (GAUZE/BANDAGES/DRESSINGS) ×1 IMPLANT
GLOVE BIO SURGEON STRL SZ 6.5 (GLOVE) ×6 IMPLANT
GLOVE BIO SURGEON STRL SZ7.5 (GLOVE) ×6 IMPLANT
GLOVE BIOGEL PI IND STRL 7.0 (GLOVE) IMPLANT
GLOVE BIOGEL PI IND STRL 7.5 (GLOVE) IMPLANT
GLOVE BIOGEL PI IND STRL 8 (GLOVE) IMPLANT
GLOVE BIOGEL PI INDICATOR 7.0 (GLOVE) ×7
GLOVE BIOGEL PI INDICATOR 7.5 (GLOVE) ×2
GLOVE BIOGEL PI INDICATOR 8 (GLOVE) ×1
GLOVE ECLIPSE 6.5 STRL STRAW (GLOVE) ×2 IMPLANT
GLOVE SS BIOGEL STRL SZ 7.5 (GLOVE) IMPLANT
GLOVE SUPERSENSE BIOGEL SZ 7.5 (GLOVE) ×1
GLOVE SURG SS PI 7.5 STRL IVOR (GLOVE) ×3 IMPLANT
GOWN PREVENTION PLUS XLARGE (GOWN DISPOSABLE) ×12 IMPLANT
GOWN STRL NON-REIN LRG LVL3 (GOWN DISPOSABLE) ×12 IMPLANT
GRAFT CV 60X8STRG TUBE KNTD (Vascular Products) IMPLANT
GRAFT HEMASHIELD 8MM (Vascular Products) ×10 IMPLANT
GRAFT PROPATEN W/RING 6X80X60 (Vascular Products) ×1 IMPLANT
GRAFT VASC STRG 30X8KNIT (Vascular Products) IMPLANT
KIT BASIN OR (CUSTOM PROCEDURE TRAY) ×5 IMPLANT
KIT ROOM TURNOVER OR (KITS) ×5 IMPLANT
LOOP VESSEL MAXI BLUE (MISCELLANEOUS) ×1 IMPLANT
LOOP VESSEL MINI RED (MISCELLANEOUS) ×4 IMPLANT
NS IRRIG 1000ML POUR BTL (IV SOLUTION) ×10 IMPLANT
PACK PERIPHERAL VASCULAR (CUSTOM PROCEDURE TRAY) ×5 IMPLANT
PAD ARMBOARD 7.5X6 YLW CONV (MISCELLANEOUS) ×10 IMPLANT
PENCIL BUTTON HOLSTER BLD 10FT (ELECTRODE) ×1 IMPLANT
SPONGE GAUZE 4X4 12PLY (GAUZE/BANDAGES/DRESSINGS) ×1 IMPLANT
SPONGE LAP 18X18 X RAY DECT (DISPOSABLE) ×5 IMPLANT
SPONGE SURGIFOAM ABS GEL 100 (HEMOSTASIS) IMPLANT
STAPLER VISISTAT 35W (STAPLE) ×5 IMPLANT
SUT PROLENE 5 0 C 1 24 (SUTURE) ×23 IMPLANT
SUT PROLENE 6 0 CC (SUTURE) ×11 IMPLANT
SUT PROLENE 7 0 BV 1 (SUTURE) ×3 IMPLANT
SUT PROLENE 7 0 BV1 MDA (SUTURE) ×1 IMPLANT
SUT SILK 2 0 FS (SUTURE) ×5 IMPLANT
SUT SILK 2 0 SH (SUTURE) ×2 IMPLANT
SUT SILK 3 0 (SUTURE) ×10
SUT SILK 3-0 18XBRD TIE 12 (SUTURE) IMPLANT
SUT VIC AB 2-0 CTX 36 (SUTURE) ×11 IMPLANT
SUT VIC AB 3-0 SH 27 (SUTURE) ×25
SUT VIC AB 3-0 SH 27X BRD (SUTURE) ×8 IMPLANT
SUT VICRYL 4-0 PS2 18IN ABS (SUTURE) ×13 IMPLANT
SYR 3ML LL SCALE MARK (SYRINGE) ×1 IMPLANT
TOWEL OR 17X24 6PK STRL BLUE (TOWEL DISPOSABLE) ×12 IMPLANT
TOWEL OR 17X26 10 PK STRL BLUE (TOWEL DISPOSABLE) ×10 IMPLANT
TRAY FOLEY CATH 14FRSI W/METER (CATHETERS) ×5 IMPLANT
WATER STERILE IRR 1000ML POUR (IV SOLUTION) ×5 IMPLANT

## 2011-05-05 NOTE — Transfer of Care (Signed)
Immediate Anesthesia Transfer of Care Note  Patient: [name].  Procedure(s) Performed: Procedure(s) (LRB): BYPASS GRAFT AXILLA-BIFEMORAL (Right) ENDARTERECTOMY FEMORAL (Bilateral) BYPASS GRAFT FEMORAL-POPLITEAL ARTERY (Right) ENDARTERECTOMY POPLITEAL (Right)  Patient Location: PACU  Anesthesia Type: General  Level of Consciousness: sedated  Airway & Oxygen Therapy: Patient Spontanous Breathing  Post-op Assessment: Report given to PACU RN and Post -op Vital signs reviewed and stable  Post vital signs: Reviewed and stable  Complications: No apparent anesthesia complications

## 2011-05-05 NOTE — Progress Notes (Signed)
Pt still requiring Dopamine, Levophed and Epi for BP support Profoundly acidotic ph 7.22 despite HCO3 drip No obvious hemtoma, transfusion in progress  No doppler left foot  Doppler right foot Pt has known mesenteric and renal stenosis as well Currently has good urine output but acidosis could certainly suggest ischemic bowel  Pt wife updated.  Will continue to try to resuscitate him but informed wife he may not survive and advised her to inform the pts son and daughter.  The wife is going to come back to the hospital this evening.  Condition remains critical.  Hopefully acidosis will resolve with resuscitation but if this is an ischemic bowel event he will probably not survive.  Fabienne Bruns, MD [facility] [place] Office: [phone] Pager: [phone]

## 2011-05-05 NOTE — Progress Notes (Signed)
eLink Physician-Brief Progress Note Patient Name: [name]. DOB: Dec 14, 1924 MRN: [id number]  Date of Service  05/05/2011   HPI/Events of Note   Remains in worsening shock - acidosis corrected , Hb 6.2, await CVP, cardiac enzymes neg  eICU Interventions  Fluid bolus, 2 U PRBCs, add epi drip D/w dr fields >> will ct efforts to stabilise   Intervention Category Major Interventions: Hypotension - evaluation and management  [name] V. 05/05/2011, 6:57 PM

## 2011-05-05 NOTE — Brief Op Note (Signed)
05/05/2011  4:30 PM  PATIENT:  [name].  78 y.o. male  PRE-OPERATIVE DIAGNOSIS:  Arterial Occulsive Disease   POST-OPERATIVE DIAGNOSIS:  Arterial Occulsive Disease   PROCEDURE:  Procedure(s) (LRB): BYPASS GRAFT AXILLA-BIFEMORAL (Right) ENDARTERECTOMY FEMORAL (Bilateral) BYPASS GRAFT FEMORAL-POPLITEAL ARTERY (Right) ENDARTERECTOMY POPLITEAL (Right) Popliteal endarterectomy right  SURGEON:  Surgeon(s) and Role:    * Sherren Kerns, MD - Primary    * Larina Earthly, MD - Assisting  PHYSICIAN ASSISTANT: roczniak, collins, ellington  EBL:  Total I/O In: 4650 [I.V.:2250; Blood:1400; IV Piggyback:1000] Out: 1585 [Urine:235; Blood:1350]  Fabienne Bruns, MD [facility] [place] Office: [phone] Pager: [phone]

## 2011-05-05 NOTE — Preoperative (Signed)
Beta Blockers   Reason not to administer Beta Blockers:Not Applicable 

## 2011-05-05 NOTE — OR Nursing (Signed)
On Transport call [id number]

## 2011-05-05 NOTE — Progress Notes (Signed)
Pt reports he did not his PO metoprolol d/t nausea/ vomiting without food. Notified Dr. Noreene Larsson and Dr. Chaney Malling of same ok to hold PO dose.

## 2011-05-05 NOTE — H&P (Signed)
  Patient is an 78 year old male with known progressive arterial occlusive disease of his lower extremities. He returns today for followup regarding his CT angiogram with runoff. Of note he did have a slightly elevated creatinine prior to the exam we were able to perform the exam however. He currently has symptoms of rest pain in his feet. This is become slowly progressively worse. He does not have any ulcerations or open wounds on his feet.  Findings of the CT angiogram were discussed with him today.  Aortoiliac segment: He has had some atrophy of his left kidney from 10.6-8.5 cm in length. He has a 50% stenosis of the celiac axis he has a 50% stenosis of the superior mesenteric artery he has a 50-60% stenosis of the right renal artery he has subtotal occlusion of the left renal artery. These are all slightly progressive from his prior CT scan of 2010. The abdominal aorta is patent. The vessels are severely calcified.  Right lower extremity: His right common iliac stent has had some progressive stenosis. His right common femoral artery is occluded. His right superficial femoral and popliteal artery is severely diseased and subtotally occluded. His right below-knee popliteal artery is patent. He has posterior tibial and peroneal runoff to the right foot.  Left lower extremity: His left common iliac artery has an 80% stenosis. His left external iliac artery is a 60-70% stenosis. His left internal iliac artery is occluded. His left common femoral artery is occluded. His left superficial femoral artery is occluded. His left popliteal artery is occluded. He has posterior tibial and peroneal runoff to the left foot.  Review of systems: He denies shortness of breath or chest pain today  Physical exam:  Filed Vitals:   05/05/11 0643  BP: 138/67  Pulse: 81  Temp: 98.4 F (36.9 C)  Resp: 18    Extremities: No open ulcers, 2+ brachial and radial pulse bilaterally, absent femoral and pedal pulses  Cardiac:  Regular rate and rhythm  Chest: Clear to auscultation  Review of most recent ABIs were 0.29 on the left 0.47 on the right  Assessment: Severe peripheral arterial occlusive disease. Due to the patient's age and the calcification of his abdominal aorta I do not believe he is a candidate for aortobifemoral bypass graft. The next option would be to do a right axillary bifemoral bypass with consideration to adding a lower extremity bypass to this at the same time. I discussed with the patient today risks benefits possible complications and procedure details of this including bleeding infection limb loss. He had significant concerns about his renal function as well. I discussed with him that I thought the legs were the more pressing issue at this time and this operation would do nothing to revascularize his kidneys. We could consider evaluating this further at some point in the future. He has a stress test scheduled with Dr. Verdis Prime on February 5. He also has an appt scheduled with his primary care physician on February 8. He will call if he wishes to proceed with axillary bifemoral bypass. He has extensive common femoral disease bilaterally and would require extensive endarterectomies or possible bypass of these and the profunda as well.   Fabienne Bruns, MD  [facility] [place]  Office: [phone]  Pager: [phone]

## 2011-05-05 NOTE — Procedures (Signed)
Central Venous Catheter Insertion Procedure Note [name] [id number] 1924/11/11  Procedure: Insertion of Central Venous Catheter Indications: Drug and/or fluid administration  Procedure Details Consent: Unable to obtain consent because of emergent medical necessity. Time Out: Verified patient identification, verified procedure, site/side was marked, verified correct patient position, special equipment/implants available, medications/allergies/relevent history reviewed, required imaging and test results available.  Performed  Maximum sterile technique was used including antiseptics, cap, gloves, gown, hand hygiene, mask and sheet. Skin prep: Chlorhexidine; local anesthetic administered A antimicrobial bonded/coated triple lumen catheter was placed in the left internal jugular vein using the Seldinger technique.  Evaluation Blood flow good Complications: No apparent complications Patient did tolerate procedure well. Chest X-ray ordered to verify placement.  CXR: pending.  U/S used in placement, picture in chart.  [name] 05/05/2011, 5:33 PM

## 2011-05-05 NOTE — Op Note (Signed)
Procedure: 1. Axillary bifemoral bypass with 8 mm Dacron based on the right axillary artery  2. Bilateral common femoral endarterectomy 3. Right popliteal endarterectomy 4. Right femoral to below-knee popliteal bypass with 6 mm PTFE  Preoperative diagnosis: Bilateral rest pain with short distance disabling claudication  Postoperative diagnosis: Same  Anesthesia: Gen.  Assistant: Gretta Began M.D., [facility] PA-C, [facility] PA-C, Almyra Brace PA-C.  Operative findings:      1.  Severe calcific atherosclerosis multi-level disease,     2.  6 mm ring supported PTFE right femoropopliteal    3.  8 mm Dacron for axillary bifemoral bypass graft with distal anastomoses to the profunda      Bilaterally    4.  Ligation of distal left external iliac artery    5.  Ligation of chronically occluded bilateral superficial femoral arteries    6.  No doppler signal left foot at end of case    7.  PT doppler signal right foot  Operative details: After obtaining informed consent, the patient was taken to the operating room. The patient was placed in supine position on the operating room table. After induction of general anesthesia and endotracheal intubation as well as placement of a right radial arterial line. The patient was prepped and draped in usual sterile fashion from the chin to the toes. Both arms were tucked.  Next a longitudinal incision was made the right groin. Incision was carried into the subcutaneous tissues down to level of the right common femoral artery. This was heavily calcified and there was no pulse within it. The profunda femoris and superficial femoral arteries were also dissected free circumferentially. The superficial femoral artery was heavily calcified and chronically occluded. The profunda femoris was calcified at its origin but softer 1-2 cm from its origin.  Vessel loops were placed around these. Circumflex iliac branches were ligated and divided between silk ties to  allow adequate exposure of the distal external iliac artery for clamping. These were also heavily calcified.  Attention was then turned to the left groin. In similar fashion, the left groin opened through a longitudinal incision. Incision was carried down through the subcutaneous tissues down to level of the native left superficial femoral artery. This was dissected free circumferentially. This was then traced up to level the femoral bifurcation. The profunda femoris artery was dissected free circumferentially. Dissection was then carried out on the common femoral artery posteriorly all the way up to the level of the inguinal ligament. The common femoral artery was heavily calcified. This was occluded. The superficial femoral artery was also heavily calcified and chronically occluded. The proximal aspect of the left profunda femoris artery was heavily calcified and much more diseased than the right side. I dissected the profunda down to several of its branch tributaries before getting to a reasonable section that was clampable vessel loops are placed around these branches at this level.  Next, a slightly oblique incision was made in the right infraclavicular space to dissect out the right axillary artery. The incision was carried through the subcutaneous tissues and down to the level of the fascia for the pectoralis muscle. The fascia was opened with cautery. The muscles were slightly divided and I tried to carry the dissection along the direction of the fibers. The axillary vein was identified. Several small side branches ligated and divided between silk ties. This was then dissected free circumferentially and pulled down to allow exposure of the axillary artery just behind it. The exit artery was  dissected free circumferentially. Several small side branches were also ligated and divided between silk ties. Several centimeters of artery was dissected free to allow a reasonable surface for grafting.  This artery  was also calcified more proximally. However the artery was softer and more distal segment. The artery was reasonable for clamping.  Next a subcutaneous tunnel was created connecting the right groin incision and then underneath the pectoralis major and minor muscle down to the right groin. An 8 mm dacron graft was then brought through this subcutaneous tunnel.  A small counterincision was made on the right chest wall for assistance in tunneling.   The patient was given 10,000 units of intravenous heparin. The patient was given several additional boluses of heparin during the course of the case. Please see anesthesia record for details. The axillary artery was clamped proximally and distally with Henley clamps. A longitudinal opening was made on the anteroinferior surface of the axillary artery and this arteriotomy was extended with Potts scissors. 8 mm and of the Dacron graft was slightly beveled and sewn end of graft to side of artery using a running 5-0 Prolene suture. At completion, the anastomosis was forebled backbled and thoroughly flushed. The graft was clamped at its origin.  Attention was then turned back to the patient's right groin. The native common femoral artery was controlled with a Cooley clamp.  The profunda femoris and superficial femoral arteries were controlled with vessel loops.  There was a large amount of calcific plaque within the native common femoral artery and almost no lumen.  The right superficial femoral artery was chronically occluded. This was ligated and divided with silk ties.  There was a trickle of flow from the native right external iliac artery.  A common femoral endarterectomy was performed extending into the orifice of the profunda femoris artery a few centimeters. A good proximal and distal endpoint was obtained. A new 8 mm axillary bypass graft was then brought down to the common femoral artery. This was cut to length. All thrombus was removed from the native common femoral  artery and there was excellent backbleeding from the profunda. I then laid the graft onto the profunda femoris with a long spatulated end extending from the common femoral artery and several centimeters onto the profunda femoris artery. Just prior to completion of the anastomosis everything was forebled backbled and thoroughly flushed the anastomosis was secured clamps released there was good pulsatile flow in the profunda medially. There was also good Doppler flow in the profunda.  Several 7-0 Prolene repair sutures were necessary at the toe of the anastomosis due to the thin friable artery.  A pledgeted suture was also placed along the right lateral wall.  An 8 mm dacron graft was brought in the operative field and tunneled subcutaneously from the left groin to the right groin. The left common femoral artery was controlled proximally with a Henley clamp. The superficial femoral and profunda femoris arteries were controlled with Vesseloops. The artery was opened longitudinally with an 11 blade. There was a large amount of atherosclerotic plaque and thrombus within the native left common femoral system. All of this was removed. There was essentially no flow through the native left external iliac artery and this was ligated.  The left superficial femoral artery was chronically occluded and heavily calcified. This was also ligated and divided between silk ties. The 8 mm  Dacron graft was beveled on the end and sewn end of graft to the left common femoral and profunda femoris  artery using a running 5-0 Prolene suture. At completion anastomosis was thoroughly backbled. This was then clamped. There was also some bleeding at the toe of this anastomosis and this was repaired with several 6-0 Prolene sutures. Again the distal profunda femoris was quite friable.  The graft was then measured to length to anastomose to the right axillary femoral bypass. This was controlled proximally with a peripheral DeBakey clamp and  distally with a Henley clamp. A longitudinal opening was made in the axillary femoral bypass. The new femoral-femoral bypass graft was then sewn end of graft to side of the axillary femoral graft using a running 5-0 Prolene suture. Just prior to completion of the anastomosis, it was forebled backbled and thoroughly flushed. The anastomosis was secured, clamps released, and there was pulsatile flow left groin immediately. Patient had a faint posterior tibial Doppler signal in the left foot. Hemostasis was then obtained with the assistance of thrombin Gelfoam in all the incisions.    At this point the right foot still had no Doppler flow. I then proceeded to make a medial incision on the right medial leg and carried this down to the subcutaneous tissues. The below-knee popliteal space was entered. There were some dense adhesions between the popliteal vein and artery. 2 small holes were made in the popliteal vein is repaired with 5-0 Prolene sutures. The popliteal artery was dissected free circumferentially. It was thickened on palpation and very calcified.  A 6 mm Propaten graft was then brought in operative field.  This was tunneled between the heads of the gastrocnemius muscle on the right leg up to the right groin. The axillary femoral bypass was controlled proximally with a peripheral DeBakey clamp and distally with a vessel loop. A longitudinal opening was made in the hood of the axillary femoral bypass approximately 2 cm below the fem-fem bypass.  This was in the hood of the anastomosis. The 6 mm PTFE graft was then beveled and sewn end of graft to side of the hood of the axillary femoral graft using running 5-0 Prolene suture. At completion the anastomosis was packed with thrombin Gelfoam. The new femoropopliteal graft was then brought through the tunnel. The leg was straightened to measure the Propaten graft length. A longitudinal opening was made in the below-knee popliteal artery. The artery was quite  thickened and diseased the lumen had been almost completely obliterated. This required further dissection down to the takeoff of the anterior tibial artery and tibial peroneal trunk. I dissected both of these circumferentially and vessel loops were placed around them. I then proceeded to perform popliteal endarterectomy with a good proximal endpoint. A reasonable endpoint was also obtained on the distal end and there was a good wide lumen to the tibial peroneal trunk and anterior tibial artery. There was some backbleeding from the tibia peroneal trunk. There was sluggish backbleeding from the anterior tibial origin. The graft was cut to length and beveled and sewn end of graft to side of below-knee popliteal artery using running 6-0 Prolene suture. At completion of the anastomosis it was forebled backbled and thoroughly flushed.   The anastomosis was secured clamps released there was good pulsatile flow the popliteal artery immediately. Patient had immediate restoration of a posterior tibial Doppler signal in the right foot. The groin and below-knee popliteal incision was packed with thrombin Gelfoam.  At this point the patient's left foot was again inspected and there was no Doppler signal at this point. However, the patient had the, hypotensive at this  point requiring vasopressor support. I did not feel that the patient was stable enough to proceed with any further revascularization of the left leg. We proceeded to dry up all incisions and make sure that they were reasonably hemostatic.  Both groins were then closed in multiple layers of running 2 0 and 3-0 Vicryl suture. Each groin incision was closed with a 4 0 Vicryl subcuticular stitch.The medial below-knee popliteal incision was closed with a running 2-0 Vicryl suture the subcutaneous layer and subcuticular in the skin. The axillary incision was closed with a running 2-0 Vicryl suture to reapproximate the muscle fascia. Skin was then closed with a 4 Vicryl  subcuticular stitch. The counter incision on the right lateral chest wall was closed with a running 3-0 Vicryl subcutaneous stitch and a 4 Vicryl subcuticular stitch. Dermabond was then applied all incisions.  Patient was still hemodynamically unstable requiring vasopressor support at the end of the case. Instrument sponge and needle counts correct in the case. The patient was taken to the ICU in stable but critical condition. Patient had audible posterior tibial Doppler signal in the right foot and no Doppler signal in left foot at the end of the case.     Fabienne Bruns, MD [facility] [place] Office: [phone] Pager: [phone]

## 2011-05-05 NOTE — OR Nursing (Signed)
1st call for SICU @1600 

## 2011-05-05 NOTE — Anesthesia Procedure Notes (Addendum)
Procedure Name: Intubation Date/Time: 05/05/2011 7:46 AM Performed by: Carmela Rima Pre-anesthesia Checklist: Emergency Drugs available, Patient identified, Timeout performed, Suction available and Patient being monitored Patient Re-evaluated:Patient Re-evaluated prior to inductionOxygen Delivery Method: Circle system utilized Preoxygenation: Pre-oxygenation with 100% oxygen Intubation Type: IV induction Ventilation: Mask ventilation without difficulty Laryngoscope Size: Mac and 3 Grade View: Grade III Tube size: 7.5 mm Number of attempts: 1 Placement Confirmation: ETT inserted through vocal cords under direct vision,  positive ETCO2 and breath sounds checked- equal and bilateral Secured at: 22 cm Tube secured with: Tape Dental Injury: Teeth and Oropharynx as per pre-operative assessment

## 2011-05-05 NOTE — Anesthesia Preprocedure Evaluation (Addendum)
Anesthesia Evaluation  Patient identified by MRN, date of birth, ID band Patient awake    Reviewed: Allergy & Precautions, H&P , NPO status , Patient's Chart, lab work & pertinent test results  Airway Mallampati: II  Neck ROM: full    Dental  (+) Dental Advidsory Given   Pulmonary asthma , former smoker         Cardiovascular hypertension, + CAD and + CABG + Valvular Problems/Murmurs     Neuro/Psych    GI/Hepatic   Endo/Other  Diabetes mellitus-  Renal/GU      Musculoskeletal   Abdominal   Peds  Hematology   Anesthesia Other Findings   Reproductive/Obstetrics                          Anesthesia Physical Anesthesia Plan  ASA: III  Anesthesia Plan: General   Post-op Pain Management:    Induction: Intravenous  Airway Management Planned: Oral ETT  Additional Equipment:   Intra-op Plan:   Post-operative Plan: Extubation in OR  Informed Consent: I have reviewed the patients History and Physical, chart, labs and discussed the procedure including the risks, benefits and alternatives for the proposed anesthesia with the patient or authorized representative who has indicated his/her understanding and acceptance.   Dental Advisory Given  Plan Discussed with: CRNA, Surgeon and Anesthesiologist  Anesthesia Plan Comments:        Anesthesia Quick Evaluation

## 2011-05-05 NOTE — Consult Note (Signed)
ANTIBIOTIC REVIEW FOR RENAL ADJUSTMENT.  Patient is a 78 y/o male s/p surgery for Arterial Occlusive Disease.  Ht: 64 in, 62 kg.   Est CrCl ~ 31 ml/min  Zinacef 1.5 gm IV q 12 hours x 2 doses ordered post-op.  Assessment/Plan:   Dose appropriate for renal status.  Continue Zinacef as ordered.  Zaidee Rion, Elisha Headland, Pharm.D. 05/05/2011 5:25 PM

## 2011-05-05 NOTE — Progress Notes (Signed)
Pt persistantly hypotensive on Dopamine BP 60's HR 100s Hgb 6 PTT>200 No obvious hematomas or bleeding Brisk PT doppler right foot No doppler signal left foot  Most likely this represents a myocardial event Will continue full support for now Resuscitate with blood and FFP Continue pressors for now Consider ECHO in am if still doing poorly  Plan d/w Dr Vassie Loll  Will update family again later this evening.  Fabienne Bruns, MD [facility] [place] Office: [phone] Pager: [phone]

## 2011-05-05 NOTE — Consult Note (Signed)
Name: [name]. MRN: [id number] DOB: Jun 02, 1924    LOS: 0  PCCM ADMISSION NOTE  History of Present Illness: 78 year old male with PMH of PVD who presented for an axillary bifemoral bipass.  Patient had a prolonged surgery and decision was made to keep patient on the vent overnight due to high transfusion volume (4 units) and length of surgery.  PCCM was called to consult for respiratory failure and shock.  Lines / Drains: L IJ TLC 3/11>>> R radial a-line 3/11>>> ET tube 3/11>>>  Cultures: None  Antibiotics: Cefuroxime 3/11>>>  Tests / Events:  3/11>>>Prolonged vascular surgery.  The patient is sedated, intubated and unable to provide history, which was obtained for available medical records.    Past Medical History  Diagnosis Date  . Hyperlipidemia     takes Atorvastatin daily  . CAD (coronary artery disease)   . Gastritis   . BPH (benign prostatic hypertrophy)     Dr.Nesi is urologist  . Arthritis   . Claudication   . Hypertension     takes Ramipril and Metoprolol daily  . Heart murmur   . Peripheral vascular disease   . Asthma     as a young child  . Bruises easily     takes Pletal and ASA daily  . Colitis     hx of  . Hx of colonic polyps   . Urinary urgency     takes Flomax daily  . Diabetes mellitus     borderline diabetic  . Blind     right eye  . Insomnia     takes Ambien nightly  . Mental disorder     takes Aricept daily  . History of shingles 3-81yrs ago   Past Surgical History  Procedure Date  . Tonsillectomy 1960  . Coronary artery bypass graft 1989/2001    5 vessels;1 valve   . Valve replacement 2001  . Cardiac catheterization     multiple-see epic  . Eye surgery     cataract left eye  . Hemorrhoid surgery 1960  . Coronary angioplasty     right leg  . Colonoscopy    Prior to Admission medications   Medication Sig Start Date End Date Taking? Authorizing Provider  aspirin EC 325 MG tablet Take 325 mg by mouth daily.   Yes  Historical Provider, MD  atorvastatin (LIPITOR) 40 MG tablet Take 40 mg by mouth daily.   Yes Historical Provider, MD  cilostazol (PLETAL) 100 MG tablet Take 100 mg by mouth 2 (two) times daily.    Yes Historical Provider, MD  donepezil (ARICEPT) 10 MG tablet Take 10 mg by mouth daily.   Yes Historical Provider, MD  ezetimibe (ZETIA) 10 MG tablet Take 10 mg by mouth at bedtime.     Yes Historical Provider, MD  fesoterodine (TOVIAZ) 4 MG TB24 Take 4 mg by mouth daily.     Yes Historical Provider, MD  metoprolol succinate (TOPROL-XL) 25 MG 24 hr tablet Take 37.5 mg by mouth 2 (two) times daily.    Yes Historical Provider, MD  nitroGLYCERIN (NITROSTAT) 0.4 MG SL tablet Place 0.4 mg under the tongue every 5 (five) minutes as needed. For chest pain   Yes Historical Provider, MD  ramipril (ALTACE) 5 MG tablet Take 5 mg by mouth daily.     Yes Historical Provider, MD  Tamsulosin HCl (FLOMAX) 0.4 MG CAPS Take 0.4 mg by mouth at bedtime.    Yes Historical Provider, MD  zolpidem Remus Loffler)  10 MG tablet Take 10 mg by mouth at bedtime as needed. For sleep   Yes Historical Provider, MD    Allergies Allergies  Allergen Reactions  . Shrimp (Shellfish Allergy) Nausea And Vomiting    Family History Family History  Problem Relation Age of Onset  . Heart disease Father   . Anesthesia problems Neg Hx   . Hypotension Neg Hx   . Malignant hyperthermia Neg Hx   . Pseudochol deficiency Neg Hx     Social History  reports that he quit smoking about 75 years ago. He does not have any smokeless tobacco history on file. He reports that he does not drink alcohol or use illicit drugs.  Review Of Systems  11 points review of systems is negative with an exception of listed in HPI.  Vital Signs: Filed Vitals:   05/05/11 1700  BP: 67/37  Pulse: 102  Temp:   Resp: 18    Intake/Output Summary (Last 24 hours) at 05/05/11 1715 Last data filed at 05/05/11 1700  Gross per 24 hour  Intake 4662.98 ml  Output    1660 ml  Net 3002.98 ml    Ventilator settings: Vent Mode:  [-] PRVC FiO2 (%):  [40 %-41.1 %] 41.1 % Set Rate:  [12 bmp] 12 bmp Vt Set:  [470 mL] 470 mL PEEP:  [5 cmH20] 5 cmH20 Plateau Pressure:  [14 cmH20] 14 cmH20  Physical Examination: General:  Sedated, intubated and chronically ill appearing. Neuro:  Sedated, intubated, moving ext spontaneously but not to command.   HEENT:  [place]/AT, PERRL, EOM-I and MMM. Neck:  Positive JVD, -LAN and -thyromegally.   Cardiovascular:  RRR, Nl S1/S2, -M/R/G. Lungs:  Coarse BS diffusely. Abdomen:  Soft, NT, ND and hypoactive bowel sounds. Musculoskeletal:  Diminished pulses and no edema. Skin:  Smooth skin on all ext.  Labs and Imaging:   Labs: CBC    Component Value Date/Time   WBC 3.1* 05/02/2011 1157   RBC 4.05* 05/02/2011 1157   HGB 12.5* 05/02/2011 1157   HCT 36.4* 05/02/2011 1157   PLT 134* 05/02/2011 1157   MCV 89.9 05/02/2011 1157   MCH 30.9 05/02/2011 1157   MCHC 34.3 05/02/2011 1157   RDW 15.1 05/02/2011 1157   LYMPHSABS 0.9 05/02/2011 1157   MONOABS 0.4 05/02/2011 1157   EOSABS 0.2 05/02/2011 1157   BASOSABS 0.0 05/02/2011 1157    BMET    Component Value Date/Time   NA 141 05/02/2011 1157   K 4.5 05/02/2011 1157   CL 106 05/02/2011 1157   CO2 28 05/02/2011 1157   GLUCOSE 105* 05/02/2011 1157   BUN 14 05/02/2011 1157   CREATININE 1.49* 05/02/2011 1157   CALCIUM 8.8 05/02/2011 1157   GFRNONAA 41* 05/02/2011 1157   GFRAA 48* 05/02/2011 1157   ABG    Component Value Date/Time   TCO2 25 05/24/2010 0820    No results found for this basename: MG in the last 168 hours Lab Results  Component Value Date   CALCIUM 8.8 05/02/2011    Assessment and Plan: 78 year old male with history of PVD, diabetes, asthma and HTN who presents to the PCCM service after a prolonged surgery as above.  Patient is profoundly hypotensive requiring multiple pressors that are being infused peripherally for now. 1. Neuro: will keep sedated and intubated overnight. 2. Cardiovascular:  avoid neo due to PVD, will also avoid dopamine due to arrhythmogenicity, will start levo to maintain SBP and follow CVPs for fluid resuscitation.  Will  also use bicarb drip for maintenance fluid inorder to avoid worsening acidosis with hyperkalemia.  Use only non-invasive cuff for pressure as the arterial line is very poor, draws blood but poor wave form.  Will not place another a-line due to vascular concerns here.  Will also order an echo and cycle cardiac enzymes. 3. Pulmonary: maintain sedated and ventilated overnight, will increase RR to 16 and F/U ABG.  Will hold off SBTs until patient is more stable from a cardiovascular standpoint.  Also watch for TRALI. 4. GI: hold of TF for now until BS are present. 5. Renal: History of renal insufficiency.  Will repeat labs now prior to changes as I suspect patient will be very hyperkalemic due to massive transfusion.  Metabolic acidosis evident will start bicarb drip. 6. ID: no evidence of infection, will continue cefuroxime but no indications for cultures or additional Abx at this time. 7. Endocrine: will start ICU hyperglycemia protocol.  Best practices / Disposition: -->ICU status under PCCM -->full code -->Heparin for DVT Px -->Protonix for GI Px -->ventilator bundle -->diet NPO -->family updated at bedside by Vascular surgery.  The patient is critically ill with multiple organ systems failure and requires high complexity decision making for assessment and support, frequent evaluation and titration of therapies, application of advanced monitoring technologies and extensive interpretation of multiple databases. Critical Care Time devoted to patient care services described in this note is 50 minutes.  Koren Bound, M.D. Pulmonary and Critical Care Medicine [facility] [phone]  05/05/2011, 5:15 PM

## 2011-05-06 ENCOUNTER — Inpatient Hospital Stay (HOSPITAL_COMMUNITY): Payer: Medicare Other

## 2011-05-06 DIAGNOSIS — E872 Acidosis: Secondary | ICD-10-CM

## 2011-05-06 DIAGNOSIS — I509 Heart failure, unspecified: Secondary | ICD-10-CM

## 2011-05-06 DIAGNOSIS — J9600 Acute respiratory failure, unspecified whether with hypoxia or hypercapnia: Secondary | ICD-10-CM

## 2011-05-06 LAB — PREPARE FRESH FROZEN PLASMA
Unit division: 0
Unit division: 0
Unit division: 0

## 2011-05-06 LAB — GLUCOSE, CAPILLARY
Glucose-Capillary: 228 mg/dL — ABNORMAL HIGH (ref 70–99)
Glucose-Capillary: 139 mg/dL — ABNORMAL HIGH (ref 70–99)
Glucose-Capillary: 131 mg/dL — ABNORMAL HIGH (ref 70–99)

## 2011-05-06 LAB — CARDIAC PANEL(CRET KIN+CKTOT+MB+TROPI)
CK, MB: 8.4 ng/mL (ref 0.3–4.0)
Relative Index: 4.7 — ABNORMAL HIGH (ref 0.0–2.5)
Total CK: 518 U/L — ABNORMAL HIGH (ref 7–232)
Troponin I: 1.46 ng/mL (ref <0.30)

## 2011-05-06 LAB — BASIC METABOLIC PANEL
BUN: 12 mg/dL (ref 6–23)
CO2: 22 mEq/L (ref 19–32)
Chloride: 106 mEq/L (ref 96–112)
Chloride: 108 mEq/L (ref 96–112)
Creatinine, Ser: 1.22 mg/dL (ref 0.50–1.35)
GFR calc Af Amer: 61 mL/min — ABNORMAL LOW (ref >90)
Glucose, Bld: 319 mg/dL — ABNORMAL HIGH (ref 70–99)
Glucose, Bld: 324 mg/dL — ABNORMAL HIGH (ref 70–99)
Potassium: 3.6 mEq/L (ref 3.5–5.1)
Sodium: 141 mEq/L (ref 135–145)

## 2011-05-06 LAB — POCT I-STAT 3, ART BLOOD GAS (G3+)
Acid-Base Excess: 1 mmol/L (ref 0.0–2.0)
Bicarbonate: 22.7 mEq/L (ref 20.0–24.0)
O2 Saturation: 99 %
Patient temperature: 99
pO2, Arterial: 137 mmHg — ABNORMAL HIGH (ref 80.0–100.0)

## 2011-05-06 LAB — CBC
HCT: 20.7 % — ABNORMAL LOW (ref 39.0–52.0)
Hemoglobin: 7.3 g/dL — ABNORMAL LOW (ref 13.0–17.0)
Hemoglobin: 9.4 g/dL — ABNORMAL LOW (ref 13.0–17.0)
MCV: 85.5 fL (ref 78.0–100.0)
Platelets: 69 10*3/uL — ABNORMAL LOW (ref 150–400)
RBC: 3.16 MIL/uL — ABNORMAL LOW (ref 4.22–5.81)
RDW: 14.3 % (ref 11.5–15.5)
WBC: 3.3 10*3/uL — ABNORMAL LOW (ref 4.0–10.5)
WBC: 4.8 10*3/uL (ref 4.0–10.5)

## 2011-05-06 LAB — POCT I-STAT 7, (LYTES, BLD GAS, ICA,H+H)
Bicarbonate: 23.9 mEq/L (ref 20.0–24.0)
O2 Saturation: 100 %
Patient temperature: 35.1
TCO2: 25 mmol/L (ref 0–100)
pCO2 arterial: 40 mmHg (ref 35.0–45.0)
pO2, Arterial: 451 mmHg — ABNORMAL HIGH (ref 80.0–100.0)

## 2011-05-06 MED ORDER — MAGNESIUM SULFATE 40 MG/ML IJ SOLN
2.0000 g | Freq: Once | INTRAMUSCULAR | Status: AC
  Administered 2011-05-06: 2 g via INTRAVENOUS
  Filled 2011-05-06: qty 50

## 2011-05-06 MED ORDER — PRO-STAT SUGAR FREE PO LIQD
30.0000 mL | Freq: Two times a day (BID) | ORAL | Status: DC
  Administered 2011-05-06 – 2011-05-07 (×2): 30 mL
  Filled 2011-05-06 (×3): qty 30

## 2011-05-06 MED ORDER — SODIUM CHLORIDE 0.9 % IV SOLN
INTRAVENOUS | Status: DC
  Administered 2011-05-06: 08:00:00 via INTRAVENOUS

## 2011-05-06 MED ORDER — SODIUM CHLORIDE 0.9 % IV SOLN
INTRAVENOUS | Status: DC
  Administered 2011-05-06: 5.7 [IU]/h via INTRAVENOUS
  Filled 2011-05-06: qty 1

## 2011-05-06 MED ORDER — DIPHENHYDRAMINE HCL 50 MG/ML IJ SOLN
12.5000 mg | Freq: Once | INTRAMUSCULAR | Status: AC
  Administered 2011-05-06: 12.5 mg via INTRAVENOUS
  Filled 2011-05-06: qty 1

## 2011-05-06 MED ORDER — CHLORHEXIDINE GLUCONATE 0.12 % MT SOLN
15.0000 mL | Freq: Two times a day (BID) | OROMUCOSAL | Status: DC
  Administered 2011-05-06 – 2011-05-13 (×13): 15 mL via OROMUCOSAL
  Filled 2011-05-06 (×16): qty 15

## 2011-05-06 MED ORDER — JEVITY 1.2 CAL PO LIQD
1000.0000 mL | ORAL | Status: DC
  Administered 2011-05-06: 17:00:00
  Filled 2011-05-06 (×3): qty 1000

## 2011-05-06 MED ORDER — BIOTENE DRY MOUTH MT LIQD
15.0000 mL | Freq: Four times a day (QID) | OROMUCOSAL | Status: DC
Start: 2011-05-07 — End: 2011-05-07
  Administered 2011-05-07 (×4): 15 mL via OROMUCOSAL

## 2011-05-06 MED ORDER — CHLORHEXIDINE GLUCONATE 0.12 % MT SOLN
OROMUCOSAL | Status: AC
  Filled 2011-05-06: qty 15

## 2011-05-06 MED ORDER — POTASSIUM PHOSPHATE DIBASIC 3 MMOLE/ML IV SOLN
30.0000 mmol | Freq: Once | INTRAVENOUS | Status: AC
  Administered 2011-05-06: 30 mmol via INTRAVENOUS
  Filled 2011-05-06: qty 10

## 2011-05-06 NOTE — Progress Notes (Signed)
Pt still trending upward on Troponin now greater than 4 Will get 12 lead EKG and consult cardiology  Fabienne Bruns, MD [facility] [place] Office: [phone] Pager: [phone]

## 2011-05-06 NOTE — Progress Notes (Signed)
*  PRELIMINARY RESULTS* Echocardiogram 2D Echocardiogram has been performed.  [name] 05/06/2011, 2:19 PM

## 2011-05-06 NOTE — Progress Notes (Addendum)
HPI:  78 year old male with PMH of PVD who presented for an axillary bifemoral bipass. Patient had a prolonged surgery and decision was made to keep patient on the vent overnight due to high transfusion volume (4 units) and length of surgery. PCCM was called to consult for respiratory failure and shock.  Antibiotics:   Cefuroxime 3/11>>>  Cultures/Sepsis Markers:   None  Access/Protocols:  L IJ TLC 3/11>>>  R radial a-line 3/11>>>  ET tube 3/11>>>  Best Practice: DVT: SCD's GI: Protonix  Subjective: No events overnight.  Physical Exam: Filed Vitals:   05/06/11 0830  BP: 122/68  Pulse: 115  Temp:   Resp: 24    Intake/Output Summary (Last 24 hours) at 05/06/11 0853 Last data filed at 05/06/11 0800  Gross per 24 hour  Intake 13449.44 ml  Output   2385 ml  Net 11064.44 ml   Vent Mode:  [-] PRVC FiO2 (%):  [29.7 %-49.9 %] 29.8 % Set Rate:  [12 bmp-16 bmp] 16 bmp Vt Set:  [470 mL] 470 mL PEEP:  [5 cmH20] 5 cmH20 Plateau Pressure:  [14 cmH20-23 cmH20] 23 cmH20  Neuro: Sedated and intubated. Cardiac: RRR, Nl S1/S2, -M/R/G. Pulmonary: Diffuse crackles. GI: Soft, NT, ND and +BS. Extremities: Diminished pulses and no edema.  Labs: CBC    Component Value Date/Time   WBC 3.3* 05/06/2011 0417   RBC 3.16* 05/06/2011 0417   HGB 9.4* 05/06/2011 0417   HCT 26.2* 05/06/2011 0417   PLT 69* 05/06/2011 0417   MCV 82.9 05/06/2011 0417   MCH 29.7 05/06/2011 0417   MCHC 35.9 05/06/2011 0417   RDW 14.2 05/06/2011 0417   LYMPHSABS 0.9 05/02/2011 1157   MONOABS 0.4 05/02/2011 1157   EOSABS 0.2 05/02/2011 1157   BASOSABS 0.0 05/02/2011 1157   BMET    Component Value Date/Time   NA 141 05/06/2011 0417   K 3.6 05/06/2011 0417   CL 108 05/06/2011 0417   CO2 22 05/06/2011 0417   GLUCOSE 319* 05/06/2011 0417   BUN 14 05/06/2011 0417   CREATININE 1.29 05/06/2011 0417   CALCIUM 7.1* 05/06/2011 0417   GFRNONAA 49* 05/06/2011 0417   GFRAA 57* 05/06/2011 0417   ABG    Component Value Date/Time   PHART 7.572* 05/06/2011 0420   PCO2ART 24.6* 05/06/2011 0420   PO2ART 137.0* 05/06/2011 0420   HCO3 22.7 05/06/2011 0420   TCO2 23 05/06/2011 0420   ACIDBASEDEF 15.0* 05/05/2011 2032   O2SAT 99.0 05/06/2011 0420    Lab 05/06/11 0417  MG 1.4*   Lab Results  Component Value Date   CALCIUM 7.1* 05/06/2011   PHOS 1.6* 05/06/2011   Chest Xray:   Assessment & Plan: 78 year old male with history of PVD, diabetes, asthma and HTN who presents to the PCCM service after a prolonged surgery as above. Patient is profoundly hypotensive requiring multiple pressors that are being infused peripherally for now.   1. Neuro: will keep sedated and intubated until off pressor.  2. Cardiovascular: Continue levophed and KVO IVF.  Monitor CVP.  Consult nutrition for TF. 3. Pulmonary: maintain sedated and ventilated, will decrease RR to 14 and D/C bicarb drip now that acidosis has been addressed.  4. GI: Start TF today until pressors are off and patient is able to begin weaning.  5. Renal: History of renal insufficiency. D/C bicarb drip, replace K, Mg and Phos and recheck in AM.  Keep foley in since patient is critically ill. 6. ID: no evidence of  infection, will continue cefuroxime but no indications for cultures or additional Abx at this time.  7. Endocrine: will start ICU hyperglycemia protocol.  The patient is critically ill with multiple organ systems failure and requires high complexity decision making for assessment and support, frequent evaluation and titration of therapies, application of advanced monitoring technologies and extensive interpretation of multiple databases. Critical Care Time devoted to patient care services described in this note is 35 minutes.  Koren Bound, MD [phone]

## 2011-05-06 NOTE — Progress Notes (Signed)
Dr Darrick Penna notified of ABG results, 7.23/27/131, pt currently on bicarb gtt, order received for 2 additional amps Sodium Bicarb.  Dr Darrick Penna also notified of Calcium 6.4, no new orders received.

## 2011-05-06 NOTE — Progress Notes (Signed)
[facility]  Subjective  - POD #1 s/p ax bifem right fem pop.  Still requiring pressors, more stable over the last few hours  Objective 111/63 108 99.3 F (37.4 C) (Axillary) 31 100%  Intake/Output Summary (Last 24 hours) at 05/06/11 0655 Last data filed at 05/06/11 0600  Gross per 24 hour  Intake 14150.59 ml  Output   2310 ml  Net 11840.59 ml   Extremities: Better wave form right radial aline, Right upper extremity edematous, abdomen and legs edematous as well but not as much.  Doppler PT faint monophasic left, brisk monophasic right  Abdomen: edema but overall soft, groin incisions edematous but no focal hematoma Chest: right axillary incision and right chest wall edematous Neuro: moves extremities  Urine output has been reasonable Acidosis corrected Still on Levophed BP 90-100 HR 100s  Assessment/Planning: Hypovolemic and cardiogenic shock most likely both relatively improved but still requiring pressor support Hopefully wean Levophed some today  Anemia appears more stable at this time, has diffuse edema at all tunneling and incision sites but no focal hematoma, he has probably bled some at each of these areas while coagulopathic  Thrombocytopenia is improved follow for now  VDRF per CCM hopefully wean as hemodynamics improve  Cardiac enzymes elevated most likely periop MI, treat supportive by optimizing hemodynamics not a candidate for heparin until hemoglobin more stable.  Consider beta blockers when off Levophed  Extremities currently viable  Will definitely need some diuresis as stability improves  GI-will probably need panda for tube feeds but will hold for now while still on significant levophed  Insulin drip hopefully glucose will improve now that he is off epinephrine cont to follow  ID- perioperative antibiotics only for now  Still critically ill but more stable today.  Continue current full support.  DVT  prophylaxis-SCD today  If right arm remains swollen may need DVT scan as there was considerable manipulation of his axillary vein intraop  Will update family later today.  I will be out of town remainder of this week.  I will have Dr Early look in on this patient.  Noemi Bellissimo E 05/06/2011 6:55 AM --  Laboratory Lab Results:  Basename 05/06/11 0417 05/05/11 1703  WBC 3.3* 7.8  HGB 9.4* 6.2*  HCT 26.2* 18.1*  PLT 69* 56*   BMET  Basename 05/06/11 0417 05/05/11 1930  NA 141 140  K 3.6 4.0  CL 108 110  CO2 22 12*  GLUCOSE 319* 346*  BUN 14 12  CREATININE 1.29 1.13  CALCIUM 7.1* 6.4*    COAG Lab Results  Component Value Date   INR 1.22 05/02/2011   No results found for this basename: PTT    Antibiotics Anti-infectives     Start     Dose/Rate Route Frequency Ordered Stop   05/05/11 1800   cefUROXime (ZINACEF) 1.5 g in dextrose 5 % 50 mL IVPB        1.5 g 100 mL/hr over 30 Minutes Intravenous Every 12 hours 05/05/11 1703 05/06/11 1759   05/05/11 0600   cefUROXime (ZINACEF) 1.5 g in dextrose 5 % 50 mL IVPB  Status:  Discontinued        1.5 g 100 mL/hr over 30 Minutes Intravenous On call to O.R. 05/04/11 1347 05/05/11 1637

## 2011-05-06 NOTE — Progress Notes (Signed)
INITIAL ADULT NUTRITION ASSESSMENT Date: 05/06/2011   Time: 12:21 PM  Reason for Assessment: Consult, Low Braden  ASSESSMENT: Male 78 y.o.  Dx: bilateral rest pain with short distance disabling claudication  Hx:  Past Medical History  Diagnosis Date  . Hyperlipidemia     takes Atorvastatin daily  . CAD (coronary artery disease)   . Gastritis   . BPH (benign prostatic hypertrophy)     Dr.Nesi is urologist  . Arthritis   . Claudication   . Hypertension     takes Ramipril and Metoprolol daily  . Heart murmur   . Peripheral vascular disease   . Asthma     as a young child  . Bruises easily     takes Pletal and ASA daily  . Colitis     hx of  . Hx of colonic polyps   . Urinary urgency     takes Flomax daily  . Diabetes mellitus     borderline diabetic  . Blind     right eye  . Insomnia     takes Ambien nightly  . Mental disorder     takes Aricept daily  . History of shingles 3-47yrs ago    Related Meds:     . cefUROXime (ZINACEF)  IV  1.5 g Intravenous Q12H  . diphenhydrAMINE  12.5 mg Intravenous Once  . docusate sodium  100 mg Oral Daily  . insulin aspart  0-3 Units Subcutaneous Q4H  . magnesium sulfate 1 - 4 g bolus IVPB  2 g Intravenous Once  . midazolam      . norepinephrine (LEVOPHED) Adult infusion  2-50 mcg/min Intravenous STAT  . pantoprazole (PROTONIX) IV  40 mg Intravenous Q24H  . potassium phosphate IVPB (mmol)  30 mmol Intravenous Once  . sodium bicarbonate      . sodium bicarbonate  100 mEq Intravenous Once  . sodium bicarbonate  50 mEq Intravenous Once  . sodium chloride  1,000 mL Intravenous Once  . DISCONTD: cefUROXime (ZINACEF)  IV  1.5 g Intravenous On Call to OR  . DISCONTD: famotidine (PEPCID) IV  20 mg Intravenous Q12H  . DISCONTD: heparin subcutaneous  5,000 Units Subcutaneous Q8H  . DISCONTD: insulin aspart  0-3 Units Subcutaneous Q4H  . DISCONTD: metoprolol  5 mg Intravenous Q6H    Ht: 5\' 4"  (162.6 cm)  Wt: 137 lb (62.143  kg)  Ideal Wt: 59 kg % Ideal Wt: 105%  Usual Wt: unable to obtain % Usual Wt: ---  Body mass index is 23.52 kg/(m^2).  Food/Nutrition Related Hx: no triggers per admission nutrition screen  Labs:  CMP     Component Value Date/Time   NA 141 05/06/2011 0417   K 3.6 05/06/2011 0417   CL 108 05/06/2011 0417   CO2 22 05/06/2011 0417   GLUCOSE 319* 05/06/2011 0417   BUN 14 05/06/2011 0417   CREATININE 1.29 05/06/2011 0417   CALCIUM 7.1* 05/06/2011 0417   PROT 2.6* 05/05/2011 1703   ALBUMIN 1.8* 05/05/2011 1703   AST 8 05/05/2011 1703   ALT 5 05/05/2011 1703   ALKPHOS 22* 05/05/2011 1703   BILITOT 0.8 05/05/2011 1703   GFRNONAA 49* 05/06/2011 0417   GFRAA 57* 05/06/2011 0417    I/O last 3 completed shifts: In: 14298.5 [I.V.:6158.5; Blood:5040; IV Piggyback:3100] Out: 2310 [Urine:960; Blood:1350] Total I/O In: 1192.2 [I.V.:532.2; NG/GT:100; IV Piggyback:560] Out: 430 [Urine:230; Emesis/NG output:200]   CBG (last 3)   Basename 05/06/11 1208 05/06/11 1127 05/06/11 1025  GLUCAP 86  97 104*    Diet Order: NPO  Supplements/Tube Feeding: N/A  IVF:    dextrose   fentaNYL infusion INTRAVENOUS Last Rate: 70 mcg/hr (05/06/11 1200)  insulin (NOVOLIN-R) infusion Last Rate: Stopped (05/06/11 1127)  norepinephrine (LEVOPHED) Adult infusion Last Rate: 10 mcg/min (05/06/11 1200)  DISCONTD: sodium chloride   DISCONTD: sodium chloride Last Rate: 100 mL/hr at 05/06/11 1000  DISCONTD: dextrose   DISCONTD: dextrose 5 % and 0.9% NaCl Last Rate: 100 mL/hr at 05/06/11 0101  DISCONTD: DOPamine Last Rate: 8 mcg/kg/min (05/05/11 1700)  DISCONTD: DOPamine Last Rate: 2 mcg/kg/min (05/05/11 2200)  DISCONTD: epinephrine Last Rate: 6 mcg/min (05/05/11 1900)  DISCONTD: epinephrine Last Rate: 1 mcg/min (05/06/11 0200)  DISCONTD: insulin (NOVOLIN-R) infusion   DISCONTD:  sodium bicarbonate infusion 1000 mL Last Rate: 75 mL/hr at 05/05/11 1730    Estimated Nutritional Needs:   Kcal: 1650-1750 Protein:  95-105 gm Fluid: 1.6-1.7 L  RD unable to obtain nutrition hx from pt -- intubated & sedated; s/p axillary bifemoral bypass, bilateral common femoral endarterectomy, right popliteal endarterectomy and right femoral to below-knee popliteal bypass 3/11; OGT in place; + bowel sounds; no edema; pt at risk for skin breakdown given low braden score; RD consulted per CCM for EN initiation & management  NUTRITION DIAGNOSIS: -Inadequate oral intake (NI-2.1).  Status: Ongoing  RELATED TO: inability to eat  AS EVIDENCE BY: NPO status  MONITORING/EVALUATION(Goals): Goal: EN to meet >90% of estimated nutrition needs to preserve lean body mass Monitor: EN tolerance, labs, weight, I/O's  EDUCATION NEEDS: -No education needs identified at this time  INTERVENTION:  Initiate Jevity 1.2 formula at 15 ml/hr, advance 10 ml every 4 hours to goal rate of 55 ml/hr with Prostat liquid protein 30 ml via tube BID to provide 1728 total kcals, 103 gm protein, 1065 ml of free water  RD to follow for nutrition care plan  Dietitian #: [phone]  DOCUMENTATION CODES Per approved criteria  -Not Applicable    Alger Memos 05/06/2011, 12:21 PM

## 2011-05-06 NOTE — Anesthesia Postprocedure Evaluation (Signed)
  Anesthesia Post-op Note  Patient: [name].  Procedure(s) Performed: Procedure(s) (LRB): BYPASS GRAFT AXILLA-BIFEMORAL (Right) ENDARTERECTOMY FEMORAL (Bilateral) BYPASS GRAFT FEMORAL-POPLITEAL ARTERY (Right) ENDARTERECTOMY POPLITEAL (Right)  Patient Location: SICU  Anesthesia Type: General  Level of Consciousness: Patient remains intubated per anesthesia plan  Airway and Oxygen Therapy: Patient remains intubated per anesthesia plan  Post-op Pain: mild  Post-op Assessment: PATIENT'S CARDIOVASCULAR STATUS UNSTABLE and RESPIRATORY FUNCTION UNSTABLE  Post-op Vital Signs: unstable  Complications: cardiovascular complications

## 2011-05-06 NOTE — Progress Notes (Signed)
Dr Darrick Penna notified of Hgb 7.3, Hct 20.7, Plt 20, CKMB 9.4 and Troponin 1.46.  Orders received for 2 additional units PRBC, 1pk Platlets.

## 2011-05-06 NOTE — Consult Note (Signed)
Admit date: 05/05/2011 Referring Physician  Dr. Darrick Penna Primary Cardiologist   Dr. Verdis Prime Reason for Consultation  NSTEMI  HPI: This is an 78yo male with extensive cardiac history including CAD s/p CABG, dyslipidemia, AVR with pericardial tissue valve, hypertension and PVD who presented for axillary bifemoreal bypass.  He had a prolonged surgery andpost op had profound hypotension requiring multiple pressors was kept on the ventilator overnight due to high transfusion volume of 4 unites, shock and length of surgery.  PCCM was consulted for assistance with respiratory failure and shock. He had a total of 3 hours with SBP in the 60-80 mmHg range before BP stabilized on pressors.  Today he was noted to have elevated cardiac markers and we are now asked to consult.  12 lead EKG shows sinus tachycardia with RBBB and old inferior infarct.  Of note he had a preoperative nuclear stress test which showed a fixed basal inferior defect with no evidence of ischemia in February of 2013.     PMH:   Past Medical History  Diagnosis Date  . Hyperlipidemia     takes Atorvastatin daily  . CAD (coronary artery disease) s/p CABG 1988 LIMA to LAD, seq SVG to RI/OM1/OM2 and SVG to RCA .  Cath 2005 SVG to RCA occluded 1988  . Gastritis   . BPH (benign prostatic hypertrophy)     Dr.Nesi is urologist  . Arthritis   . Claudication   . Hypertension     takes Ramipril and Metoprolol daily  . Heart murmur   . Peripheral vascular disease   . Asthma     as a young child  . Bruises easily     takes Pletal and ASA daily   AVR with pericardial bioprosthetic valve 2005  . Colitis      hx of  . Hx of colonic polyps   . Urinary urgency     takes Flomax daily  . Diabetes mellitus     borderline diabetic  . Blind     right eye  . Insomnia     takes Ambien nightly  . Mental disorder     takes Aricept daily  . History of shingles 3-42yrs ago     PSH:   Past Surgical History  Procedure Date  . Tonsillectomy  1960  . Coronary artery bypass graft 1989/2001    5 vessels;1 valve   . Valve replacement - pericardial AVR 2005  . Cardiac catheterization     multiple-see epic  . Eye surgery     cataract left eye  . Hemorrhoid surgery 1960  .  angioplasty     right leg  . Colonoscopy     Allergies:  Shrimp Prior to Admit Meds:   Prescriptions prior to admission  Medication Sig Dispense Refill  . aspirin EC 325 MG tablet Take 325 mg by mouth daily.      Marland Kitchen atorvastatin (LIPITOR) 40 MG tablet Take 40 mg by mouth daily.      . cilostazol (PLETAL) 100 MG tablet Take 100 mg by mouth 2 (two) times daily.       Marland Kitchen donepezil (ARICEPT) 10 MG tablet Take 10 mg by mouth daily.      Marland Kitchen ezetimibe (ZETIA) 10 MG tablet Take 10 mg by mouth at bedtime.        . fesoterodine (TOVIAZ) 4 MG TB24 Take 4 mg by mouth daily.        . metoprolol succinate (TOPROL-XL) 25 MG 24 hr tablet Take 37.5  mg by mouth 2 (two) times daily.       . nitroGLYCERIN (NITROSTAT) 0.4 MG SL tablet Place 0.4 mg under the tongue every 5 (five) minutes as needed. For chest pain      . ramipril (ALTACE) 5 MG tablet Take 5 mg by mouth daily.        . Tamsulosin HCl (FLOMAX) 0.4 MG CAPS Take 0.4 mg by mouth at bedtime.       Marland Kitchen zolpidem (AMBIEN) 10 MG tablet Take 10 mg by mouth at bedtime as needed. For sleep       Fam HX:    Family History  Problem Relation Age of Onset  . Heart disease Father   . Anesthesia problems Neg Hx   . Hypotension Neg Hx   . Malignant hyperthermia Neg Hx   . Pseudochol deficiency Neg Hx    Social HX:    History   Social History  . Marital Status: Married    Spouse Name: N/A    Number of Children: N/A  . Years of Education: N/A   Occupational History  . Not on file.   Social History Main Topics  . Smoking status: Former Smoker    Quit date: 02/25/1936  . Smokeless tobacco: Not on file  . Alcohol Use: No  . Drug Use: No  . Sexually Active: Not Currently   Other Topics Concern  . Not on file   Social  History Narrative  . No narrative on file     ROS:  All 11 ROS were addressed and are negative except what is stated in the HPI  Physical Exam: Blood pressure 97/54, pulse 115, temperature 98.4 F (36.9 C), temperature source Oral, resp. rate 17, height 5\' 4"  (1.626 m), weight 62.143 kg (137 lb), SpO2 100.00%.    General: Well developed, well nourished, in no acute distress Head: Eyes PERRLA, No xanthomas.   Normal cephalic and atramatic  Lungs:  Clear bilaterally to auscultation and percussion. Heart:   HRRR tachy S1 S2 Pulses are 2+ & equal. Abdomen: Bowel sounds are positive, abdomen soft and non-tender without masses  Extremities:   No clubbing, cyanosis or edema.  DP +1    Labs:   Lab Results  Component Value Date   WBC 3.3* 05/06/2011   HGB 9.4* 05/06/2011   HCT 26.2* 05/06/2011   MCV 82.9 05/06/2011   PLT 69* 05/06/2011    Lab 05/06/11 0417 05/05/11 1703  NA 141 --  K 3.6 --  CL 108 --  CO2 22 --  BUN 14 --  CREATININE 1.29 --  CALCIUM 7.1* --  PROT -- 2.6*  BILITOT -- 0.8  ALKPHOS -- 22*  ALT -- 5  AST -- 8  GLUCOSE 319* --   No results found for this basename: PTT   Lab Results  Component Value Date   INR 1.22 05/02/2011   Lab Results  Component Value Date   CKTOTAL PENDING 05/06/2011   CKMB 8.4* 05/06/2011   TROPONINI 4.21* 05/06/2011         Radiology:  Dg Chest Port 1 View  05/06/2011  *RADIOLOGY REPORT*  Clinical Data: ET tube placement; aortobifemoral graft  PORTABLE CHEST - 1 VIEW  Comparison: May 06, 2011 at 1:56 a  Findings: The ET tube remains well above the carina.  The nasogastric tube tip is at the gastric antrum.  There is mild pulmonary vascular cephalization without frank edema.  No focal infiltrates or effusions are identified.  The left  central line tip is stable at the cavoatrial junction.  IMPRESSION: Stable chest x-ray with mild pulmonary vascular cephalization but no frank edema.  Original Report Authenticated By: Brandon Melnick, M.D.     Dg Chest Port 1 View  05/06/2011  *RADIOLOGY REPORT*  Clinical Data: Assess pleural effusions, shortness of breath.  PORTABLE CHEST - 1 VIEW  Comparison: 05/05/2011  Findings: Endotracheal tube tip is positioned 3.7 cm proximal to the carina.  Left approach central venous catheter tip projects over the cavoatrial junction.  Status post median sternotomy and CABG.  Aortic arch atherosclerotic calcification.  NG tube descends into the abdomen coils within the gastric fundus.  Mild interstitial opacity/bronchitic change.  No focal consolidation, pleural effusion, pneumothorax.  There is a 7 mm nodule in the right upper lobe that may be calcified.  IMPRESSION: Mild interstitial prominence / bronchitic change without focal consolidation.  Endotracheal tube 3.7 cm proximal to the carina.  7 mm nodule right upper lobe. Attention on follow-up.  Original Report Authenticated By: Waneta Martins, M.D.   Dg Chest Port 1 View  05/05/2011  *RADIOLOGY REPORT*  Clinical Data: Intubated and central line placement.  PORTABLE CHEST - 1 VIEW  Comparison: 05/02/2011.  Findings: Interval endotracheal tube with its tip 3 cm above the carina. Interval left jugular catheter with its tip at the junction of the superior vena cava and right atrium.  No pneumothorax. Borderline enlarged cardiac silhouette.  The previously demonstrated rounded calcification at the lateral right lung apex is shown to lie outside of the chest on today's view, rotated to the right, compatible with arterial atheromatous calcification. There is similar calcification overlying the medial right lung apex on this view.  Mild diffuse peribronchial thickening.  Nasogastric tube tip in the mid stomach.  Prosthetic heart valve.  IMPRESSION:  1.  Endotracheal tube and left jugular catheter in satisfactory position. 2.  Mild bronchitic changes and borderline cardiomegaly. 3.  Arterial atheromatous calcifications overlying the right lung apex.  Original Report  Authenticated By: Darrol Angel, M.D.    EKG: NSR with RBBB and old inferior infarct  ASSESSMENT: 1.  NSTEMI type II secondary to demand ischemia in the setting of profound hypotension 2.  Profound hypotension requiring pressor support 3.  CAD s/p CABG with recent nuclear stress test 03/2011 with fixed inferior defect - no ischemia 4.  Remote AVR 5.  Severe PVD s/p axillary bifemoral bypass  PLAN:   1.  Check 2D echo to reassess LVF 2.  Wean pressors as BP tolerates 3.  Will follow with you   Quintella Reichert, MD  05/06/2011  10:52 AM

## 2011-05-06 NOTE — Progress Notes (Signed)
CRITICAL VALUE ALERT  Critical value received:  Troponin 4.2, CKBM 8.4    Date of notification: 05-06-11  Time of notification:  1010  Critical value read back:yes  Nurse who received alert:  Joelyn Oms  MD notified (1st page): Molli Knock  Time of first page:  1012  MD notified (2nd page): Hart Rochester  Time of second page: 1014  Responding MD:  Molli Knock  Time MD responded:  1014, Lawson responded @ 1016, no new orders received, will cont to monitor

## 2011-05-06 NOTE — Addendum Note (Signed)
Addendum  created 05/06/11 1246 by Carmela Rima, CRNA   Modules edited:Anesthesia Medication Administration

## 2011-05-06 NOTE — Progress Notes (Signed)
Dr Darrick Penna notified that right side of pt chest from clavicle down under arm toward back was swollen and noticeable bruising  Which has changed since assessment at 2200, 05/05/11.  No new orders received.

## 2011-05-06 NOTE — Significant Event (Signed)
Pt now with alkalosis on ABG.  Will d/c HCO3 from IV fluid.  Coralyn Helling, MD 05/06/2011, 5:59 AM [phone]

## 2011-05-07 ENCOUNTER — Inpatient Hospital Stay (HOSPITAL_COMMUNITY): Payer: Medicare Other

## 2011-05-07 LAB — POCT I-STAT 3, ART BLOOD GAS (G3+)
Acid-Base Excess: 3 mmol/L — ABNORMAL HIGH (ref 0.0–2.0)
Acid-Base Excess: 3 mmol/L — ABNORMAL HIGH (ref 0.0–2.0)
Bicarbonate: 27.7 mEq/L — ABNORMAL HIGH (ref 20.0–24.0)
O2 Saturation: 98 %
TCO2: 28 mmol/L (ref 0–100)
TCO2: 29 mmol/L (ref 0–100)
pCO2 arterial: 35.6 mmHg (ref 35.0–45.0)
pO2, Arterial: 107 mmHg — ABNORMAL HIGH (ref 80.0–100.0)

## 2011-05-07 LAB — CBC
Platelets: 56 10*3/uL — ABNORMAL LOW (ref 150–400)
RBC: 2.41 MIL/uL — ABNORMAL LOW (ref 4.22–5.81)
RDW: 15.2 % (ref 11.5–15.5)
WBC: 9 10*3/uL (ref 4.0–10.5)

## 2011-05-07 LAB — MAGNESIUM: Magnesium: 1.8 mg/dL (ref 1.5–2.5)

## 2011-05-07 LAB — HEMOGLOBIN AND HEMATOCRIT, BLOOD: HCT: 20.7 % — ABNORMAL LOW (ref 39.0–52.0)

## 2011-05-07 LAB — GLUCOSE, CAPILLARY
Glucose-Capillary: 160 mg/dL — ABNORMAL HIGH (ref 70–99)
Glucose-Capillary: 192 mg/dL — ABNORMAL HIGH (ref 70–99)

## 2011-05-07 LAB — BASIC METABOLIC PANEL
CO2: 27 mEq/L (ref 19–32)
Chloride: 105 mEq/L (ref 96–112)
Creatinine, Ser: 1.57 mg/dL — ABNORMAL HIGH (ref 0.50–1.35)
GFR calc Af Amer: 45 mL/min — ABNORMAL LOW (ref >90)
Sodium: 139 mEq/L (ref 135–145)

## 2011-05-07 LAB — PREPARE PLATELET PHERESIS: Unit division: 0

## 2011-05-07 LAB — PHOSPHORUS: Phosphorus: 3.5 mg/dL (ref 2.3–4.6)

## 2011-05-07 LAB — PRO B NATRIURETIC PEPTIDE: Pro B Natriuretic peptide (BNP): 581 pg/mL — ABNORMAL HIGH (ref 0–450)

## 2011-05-07 MED ORDER — POTASSIUM CHLORIDE 10 MEQ/50ML IV SOLN
10.0000 meq | INTRAVENOUS | Status: AC
  Administered 2011-05-07 (×2): 10 meq via INTRAVENOUS
  Filled 2011-05-07: qty 100

## 2011-05-07 MED ORDER — FUROSEMIDE 10 MG/ML IJ SOLN
40.0000 mg | Freq: Once | INTRAMUSCULAR | Status: AC
  Administered 2011-05-07: 40 mg via INTRAVENOUS
  Filled 2011-05-07: qty 4

## 2011-05-07 MED ORDER — POTASSIUM CHLORIDE 20 MEQ/15ML (10%) PO LIQD
40.0000 meq | Freq: Every day | ORAL | Status: DC
  Filled 2011-05-07: qty 30

## 2011-05-07 MED FILL — Sodium Bicarbonate IV Soln 8.4%: INTRAVENOUS | Qty: 50 | Status: AC

## 2011-05-07 NOTE — Procedures (Signed)
Extubation Procedure Note  Patient Details:   Name: [name]. DOB: 1924/03/22 MRN: [id number]   Airway Documentation:   pt extubated @1200  per dr. Molli Knock order. Pt tolerating well at this time, no distress. Pt had positive cuff leak. No stridor post extubation  Evaluation  O2 sats: stable throughout and currently acceptable Complications: No apparent complications Patient did tolerate procedure well. Bilateral Breath Sounds: Diminished Suctioning: Airway   Christie Beckers 05/07/2011, 12:05 PM

## 2011-05-07 NOTE — Progress Notes (Signed)
HPI:  78 year old male with PMH of PVD who presented for an axillary bifemoral bipass. Patient had a prolonged surgery and decision was made to keep patient on the vent overnight due to high transfusion volume (4 units) and length of surgery. PCCM was called to consult for respiratory failure and shock.  Antibiotics:   Cefuroxime 3/11>>>  Cultures/Sepsis Markers:   None  Access/Protocols:  L IJ TLC 3/11>>>  R radial a-line 3/11>>>  ET tube 3/11>>>  Best Practice: DVT: SCD's GI: Protonix  Subjective: No events overnight.  Physical Exam: Filed Vitals:   05/07/11 1300  BP: 102/44  Pulse: 108  Temp:   Resp: 16    Intake/Output Summary (Last 24 hours) at 05/07/11 1347 Last data filed at 05/07/11 1300  Gross per 24 hour  Intake 1544.97 ml  Output   2035 ml  Net -490.03 ml   Vent Mode:  [-] CPAP FiO2 (%):  [29.7 %-30.3 %] 30.1 % Set Rate:  [14 bmp] 14 bmp Vt Set:  [0 mL-470 mL] 1 mL PEEP:  [0 cmH20-5.1 cmH20] 0 cmH20 Pressure Support:  [5 cmH20-8 cmH20] 5 cmH20 Plateau Pressure:  [9 cmH20-25 cmH20] 9 cmH20  Neuro: Sedated and intubated. Cardiac: RRR, Nl S1/S2, -M/R/G. Pulmonary: Diffuse crackles. GI: Soft, NT, ND and +BS. Extremities: Diminished pulses and no edema.  Labs: CBC    Component Value Date/Time   WBC 9.0 05/07/2011 0425   RBC 2.41* 05/07/2011 0425   HGB 7.4* 05/07/2011 0425   HCT 20.5* 05/07/2011 0425   PLT 56* 05/07/2011 0425   MCV 85.1 05/07/2011 0425   MCH 30.7 05/07/2011 0425   MCHC 36.1* 05/07/2011 0425   RDW 15.2 05/07/2011 0425   LYMPHSABS 0.9 05/02/2011 1157   MONOABS 0.4 05/02/2011 1157   EOSABS 0.2 05/02/2011 1157   BASOSABS 0.0 05/02/2011 1157   BMET    Component Value Date/Time   NA 139 05/07/2011 0425   K 3.6 05/07/2011 0425   CL 105 05/07/2011 0425   CO2 27 05/07/2011 0425   GLUCOSE 192* 05/07/2011 0425   BUN 20 05/07/2011 0425   CREATININE 1.57* 05/07/2011 0425   CALCIUM 7.0* 05/07/2011 0425   GFRNONAA 39* 05/07/2011 0425   GFRAA 45* 05/07/2011  0425   ABG    Component Value Date/Time   PHART 7.424 05/07/2011 1147   PCO2ART 42.4 05/07/2011 1147   PO2ART 97.0 05/07/2011 1147   HCO3 27.7* 05/07/2011 1147   TCO2 29 05/07/2011 1147   ACIDBASEDEF 15.0* 05/05/2011 2032   O2SAT 98.0 05/07/2011 1147    Lab 05/07/11 0425  MG 1.8   Lab Results  Component Value Date   CALCIUM 7.0* 05/07/2011   PHOS 3.5 05/07/2011   Chest Xray:   Assessment & Plan: 78 year old male with history of PVD, diabetes, asthma and HTN who presents to the PCCM service after a prolonged surgery as above. Patient is profoundly hypotensive requiring multiple pressors that are being infused peripherally for now.   1. Neuro: D/C sedation.  2. Cardiovascular: D/C levophed and IVF. 3. Pulmonary: Weaning well, will extubate, IS and flutter valve, titrate O2 down.  4. GI: Swallow evaluation.  5. Renal: History of renal insufficiency. KVO IVF and lasix another dose with KCl x1 today. 6. ID: no evidence of infection, will continue cefuroxime but no indications for cultures or additional Abx at this time.  7. Endocrine: will start ICU hyperglycemia protocol.  The patient is critically ill with multiple organ systems failure and requires  high complexity decision making for assessment and support, frequent evaluation and titration of therapies, application of advanced monitoring technologies and extensive interpretation of multiple databases. Critical Care Time devoted to patient care services described in this note is 35 minutes.  Koren Bound, MD [phone]

## 2011-05-07 NOTE — Progress Notes (Signed)
Speech Language/Pathology Order received for bedside swallow evaluation.  Pt extubated at noon today - will follow for assessment 3/14 a.m. Bora Bost L. Samson Frederic, [place] CCC/SLP Pager [phone]

## 2011-05-07 NOTE — Progress Notes (Addendum)
[facility] [place]  Progress Note Bypass Surgery  Date of Surgery: 05/05/2011  Procedure(s): Right BYPASS GRAFT AXILLA-BIFEMORAL ENDARTERECTOMY FEMORAL BYPASS GRAFT FEMORAL-POPLITEAL ARTERY ENDARTERECTOMY POPLITEAL Surgeon: Surgeon(s): Sherren Kerns, MD Larina Earthly, MD  2 Days Post-Op  History of Present Illness  [name]. is a 78 y.o. male who is stable S/P above procedure. He is awake and responsive this am. Weaning comfortably off vent. U/O low -30cc/hr and generalized edema H/H down this am some dilutional, some post-op hematoma Pt has positive flatus  VASC. LAB Studies:        ZOX:[id number]   Imaging:  Significant Diagnostic Studies: CBC Lab Results  Component Value Date   WBC 9.0 05/07/2011   HGB 7.4* 05/07/2011   HCT 20.5* 05/07/2011   MCV 85.1 05/07/2011   PLT 56* 05/07/2011    BMET     Component Value Date/Time   NA 139 05/07/2011 0425   K 3.6 05/07/2011 0425   CL 105 05/07/2011 0425   CO2 27 05/07/2011 0425   GLUCOSE 192* 05/07/2011 0425   BUN 20 05/07/2011 0425   CREATININE 1.57* 05/07/2011 0425   CALCIUM 7.0* 05/07/2011 0425   GFRNONAA 39* 05/07/2011 0425   GFRAA 45* 05/07/2011 0425    COAG Lab Results  Component Value Date   INR 1.22 05/02/2011   No results found for this basename: PTT    Physical Examination  BP Readings from Last 3 Encounters:  05/07/11 97/43  05/07/11 97/43  05/02/11 100/60   Temp Readings from Last 3 Encounters:  05/07/11 99.5 F (37.5 C) Oral  05/07/11 99.5 F (37.5 C) Oral  05/02/11 97 F (36.1 C)    SpO2 Readings from Last 3 Encounters:  05/07/11 96%  05/07/11 96%  05/02/11 95%   Pulse Readings from Last 3 Encounters:  05/07/11 105  05/07/11 105  05/02/11 85    Pt is A&O x 3 right upper chest and lower leg: Incision/s is/are clean,dry.intact, and  healing with hematoma, but no erythema or drainage Limbs are warm; with good color Pt has good sensation and motion of  BLE  Left Dorsalis Pedis pulse is absent LeftPosterior tibial pulse is  monophasic by Doppler  Right Dorsalis Pedis pulse is absent RightPosterior tibial pulse is  monophasic by Doppler   Assessment/Plan: 1. Pt. More alert and following commands 2. Generalized edema with decreased U/O, Cr slightly more elevated 3. H/H down again - may be dilutional as well as post-op hematoma on right side from tunnel and gen oozing. Could not give protamine at end of case sec to hypotension 4. Post-op pain is controlled 5. Weaning from vent 6. Cont foley for strict I/O 7. Lasix today if OK with CCM 8. May need transfusion today if repeat H/H still down  [name] [phone] 05/07/2011 7:55 AM        I have examined the patient, reviewed and agree with above.Pt extubated and comfortable.  Palp ax-fem,fem-fem- and right popliteal graft pulse.  Getting 2 u prbc  Mark Friel, MD 05/07/2011 4:41 PM

## 2011-05-07 NOTE — Plan of Care (Signed)
Problem: Phase I Progression Outcomes Goal: Patient extubated within - Outcome: Completed/Met Date Met:  05/07/11 Pt extubated 05/07/11

## 2011-05-07 NOTE — Progress Notes (Signed)
Patient Name: [name]. Date of Encounter: 05/07/2011    SUBJECTIVE: The patient is intubated. He is awake.  TELEMETRY:  Sinus rhythm: Filed Vitals:   05/07/11 0738 05/07/11 0758 05/07/11 0800 05/07/11 0815  BP: 97/43  137/45 154/47  Pulse: 105  108 116  Temp:  99.9 F (37.7 C)    TempSrc:  Axillary    Resp: 15  17 10   Height:      Weight:      SpO2: 96%  99% 99%    Intake/Output Summary (Last 24 hours) at 05/07/11 0853 Last data filed at 05/07/11 0800  Gross per 24 hour  Intake 2236.37 ml  Output   1155 ml  Net 1081.37 ml    LABS: Basic Metabolic Panel:  Basename 05/07/11 0425 05/06/11 0417  NA 139 141  K 3.6 3.6  CL 105 108  CO2 27 22  GLUCOSE 192* 319*  BUN 20 14  CREATININE 1.57* 1.29  CALCIUM 7.0* 7.1*  MG 1.8 1.4*  PHOS 3.5 1.6*   CBC:  Basename 05/07/11 0425 05/06/11 0417  WBC 9.0 3.3*  NEUTROABS -- --  HGB 7.4* 9.4*  HCT 20.5* 26.2*  MCV 85.1 82.9  PLT 56* 69*   Cardiac Enzymes:  Basename 05/06/11 0925 05/05/11 1553 05/05/11 0001  CKTOTAL 518* 82 200  CKMB 8.4* 1.9 9.4*  CKMBINDEX -- -- --  TROPONINI 4.21* <0.30 1.46*   ECHO: Study Conclusions  - Left ventricle: The cavity size was normal. Systolic function was vigorous. The estimated ejection fraction was in the range of 65% to 70%. Possible akinesis of the basalinferoseptal myocardium. There was a reduced contribution of atrial contraction to ventricular filling, due to increased ventricular diastolic pressure or atrial contractile dysfunction. Doppler parameters are consistent with a reversible restrictive pattern, indicative of decreased left ventricular diastolic compliance and/or increased left atrial pressure (grade 3 diastolic dysfunction). - Mitral valve: Moderately calcified annulus. Moderate diffuse thickening. - Atrial septum: No defect or patent foramen ovale was identified. - Pericardium, extracardiac: A small, free-flowing pericardial effusion was  identified posterior to the heart.  Radiology/Studies:  No new study  Physical Exam: Blood pressure 154/47, pulse 116, temperature 99.9 F (37.7 C), temperature source Axillary, resp. rate 10, height 5\' 4"  (1.626 m), weight 77 kg (169 lb 12.1 oz), SpO2 99.00%. Weight change: 14.857 kg (32 lb 12.1 oz)   Heart sounds are distant. Tachycardic. No obvious murmur.  Arousable. Moves extremities.  ASSESSMENT:  1. Type II non- ST elevation myocardial infarction due to severe anemia and hypotension in the setting of chronic coronary disease.  2. Status post very complicated lower extremity surgical repair rosacea and procedure via axillofemoral approach.  3. Hypotension resolved  4. Acute on chronic diastolic heart failure  5. Acute kidney injury    Plan:  1. IV diuresis as tolerated by blood pressure  2. Check BNP  3. Resume beta blockers therapy when stable.  4. At high risk for development of atrial arrhythmias, we'll continue to follow.  Selinda Eon 05/07/2011, 8:53 AM

## 2011-05-08 ENCOUNTER — Inpatient Hospital Stay (HOSPITAL_COMMUNITY): Payer: Medicare Other

## 2011-05-08 DIAGNOSIS — E872 Acidosis: Secondary | ICD-10-CM

## 2011-05-08 DIAGNOSIS — I509 Heart failure, unspecified: Secondary | ICD-10-CM

## 2011-05-08 DIAGNOSIS — Z48812 Encounter for surgical aftercare following surgery on the circulatory system: Secondary | ICD-10-CM

## 2011-05-08 DIAGNOSIS — J9600 Acute respiratory failure, unspecified whether with hypoxia or hypercapnia: Secondary | ICD-10-CM

## 2011-05-08 LAB — TYPE AND SCREEN
ABO/RH(D): A POS
Antibody Screen: NEGATIVE
Unit division: 0
Unit division: 0
Unit division: 0
Unit division: 0
Unit division: 0

## 2011-05-08 LAB — CBC
Hemoglobin: 8.6 g/dL — ABNORMAL LOW (ref 13.0–17.0)
MCH: 31.2 pg (ref 26.0–34.0)
MCHC: 34.8 g/dL (ref 30.0–36.0)
MCV: 89.1 fL (ref 78.0–100.0)
MCV: 89.3 fL (ref 78.0–100.0)
Platelets: 49 10*3/uL — ABNORMAL LOW (ref 150–400)
RBC: 2.76 MIL/uL — ABNORMAL LOW (ref 4.22–5.81)
RDW: 15.4 % (ref 11.5–15.5)
WBC: 9.4 10*3/uL (ref 4.0–10.5)

## 2011-05-08 LAB — GLUCOSE, CAPILLARY
Glucose-Capillary: 176 mg/dL — ABNORMAL HIGH (ref 70–99)
Glucose-Capillary: 116 mg/dL — ABNORMAL HIGH (ref 70–99)
Glucose-Capillary: 154 mg/dL — ABNORMAL HIGH (ref 70–99)
Glucose-Capillary: 141 mg/dL — ABNORMAL HIGH (ref 70–99)
Glucose-Capillary: 125 mg/dL — ABNORMAL HIGH (ref 70–99)

## 2011-05-08 LAB — BASIC METABOLIC PANEL
CO2: 29 mEq/L (ref 19–32)
Calcium: 7.2 mg/dL — ABNORMAL LOW (ref 8.4–10.5)
Creatinine, Ser: 1.39 mg/dL — ABNORMAL HIGH (ref 0.50–1.35)
Glucose, Bld: 118 mg/dL — ABNORMAL HIGH (ref 70–99)

## 2011-05-08 MED ORDER — RAMIPRIL 5 MG PO CAPS
5.0000 mg | ORAL_CAPSULE | Freq: Every day | ORAL | Status: DC
  Administered 2011-05-08 – 2011-05-13 (×6): 5 mg via ORAL
  Filled 2011-05-08 (×6): qty 1

## 2011-05-08 MED ORDER — CILOSTAZOL 100 MG PO TABS
100.0000 mg | ORAL_TABLET | Freq: Two times a day (BID) | ORAL | Status: DC
  Administered 2011-05-08 – 2011-05-13 (×10): 100 mg via ORAL
  Filled 2011-05-08 (×14): qty 1

## 2011-05-08 MED ORDER — MAGNESIUM SULFATE 40 MG/ML IJ SOLN
2.0000 g | Freq: Once | INTRAMUSCULAR | Status: AC
  Administered 2011-05-08: 2 g via INTRAVENOUS
  Filled 2011-05-08: qty 50

## 2011-05-08 MED ORDER — FESOTERODINE FUMARATE ER 4 MG PO TB24
4.0000 mg | ORAL_TABLET | Freq: Every day | ORAL | Status: DC
  Administered 2011-05-08 – 2011-05-13 (×6): 4 mg via ORAL
  Filled 2011-05-08 (×6): qty 1

## 2011-05-08 MED ORDER — NITROGLYCERIN 0.4 MG SL SUBL: 0.4000 mg | SUBLINGUAL_TABLET | SUBLINGUAL | Status: DC | PRN

## 2011-05-08 MED ORDER — METOPROLOL SUCCINATE ER 25 MG PO TB24
37.5000 mg | ORAL_TABLET | Freq: Two times a day (BID) | ORAL | Status: DC
  Administered 2011-05-08 – 2011-05-13 (×8): 37.5 mg via ORAL
  Filled 2011-05-08 (×16): qty 1

## 2011-05-08 MED ORDER — POTASSIUM PHOSPHATE DIBASIC 3 MMOLE/ML IV SOLN
20.0000 mmol | Freq: Once | INTRAVENOUS | Status: AC
  Administered 2011-05-08: 20 mmol via INTRAVENOUS
  Filled 2011-05-08: qty 6.67

## 2011-05-08 MED ORDER — ASPIRIN EC 325 MG PO TBEC
325.0000 mg | DELAYED_RELEASE_TABLET | Freq: Every day | ORAL | Status: DC
  Administered 2011-05-08 – 2011-05-13 (×6): 325 mg via ORAL
  Filled 2011-05-08 (×6): qty 1

## 2011-05-08 MED ORDER — EZETIMIBE 10 MG PO TABS
10.0000 mg | ORAL_TABLET | Freq: Every day | ORAL | Status: DC
  Administered 2011-05-08 – 2011-05-11 (×4): 10 mg via ORAL
  Filled 2011-05-08 (×8): qty 1

## 2011-05-08 MED ORDER — ZOLPIDEM TARTRATE 5 MG PO TABS
5.0000 mg | ORAL_TABLET | Freq: Every evening | ORAL | Status: DC | PRN
  Administered 2011-05-10 – 2011-05-12 (×4): 5 mg via ORAL
  Filled 2011-05-08 (×4): qty 1

## 2011-05-08 MED ORDER — RAMIPRIL 5 MG PO TABS: 5.0000 mg | ORAL_TABLET | Freq: Every day | ORAL | Status: DC

## 2011-05-08 MED ORDER — INSULIN ASPART 100 UNIT/ML ~~LOC~~ SOLN: 0.0000 [IU] | Freq: Three times a day (TID) | SUBCUTANEOUS | Status: DC

## 2011-05-08 MED ORDER — TAMSULOSIN HCL 0.4 MG PO CAPS
0.4000 mg | ORAL_CAPSULE | Freq: Every day | ORAL | Status: DC
  Administered 2011-05-08 – 2011-05-11 (×4): 0.4 mg via ORAL
  Filled 2011-05-08 (×9): qty 1

## 2011-05-08 MED ORDER — FUROSEMIDE 10 MG/ML IJ SOLN
40.0000 mg | Freq: Once | INTRAMUSCULAR | Status: AC
  Administered 2011-05-08: 40 mg via INTRAVENOUS
  Filled 2011-05-08: qty 4

## 2011-05-08 MED ORDER — DONEPEZIL HCL 10 MG PO TABS
10.0000 mg | ORAL_TABLET | Freq: Every day | ORAL | Status: DC
  Administered 2011-05-08 – 2011-05-13 (×6): 10 mg via ORAL
  Filled 2011-05-08 (×6): qty 1

## 2011-05-08 MED ORDER — ATORVASTATIN CALCIUM 40 MG PO TABS
40.0000 mg | ORAL_TABLET | Freq: Every day | ORAL | Status: DC
  Administered 2011-05-08 – 2011-05-13 (×6): 40 mg via ORAL
  Filled 2011-05-08 (×6): qty 1

## 2011-05-08 NOTE — Progress Notes (Signed)
Utilization review completed. Chijioke Lasser, RN, BSN. 05/08/11  

## 2011-05-08 NOTE — Progress Notes (Signed)
Pt tx from 2300. MD/N pt having PVC's. No new orders

## 2011-05-08 NOTE — Progress Notes (Signed)
Patient Name: [name]. Date of Encounter: 05/08/2011    SUBJECTIVE: He is doing much better today. He is extubated. He denies chest pain and dyspnea.  TELEMETRY:  Sinus rhythm and sinus tachycardia.: Filed Vitals:   05/08/11 1159 05/08/11 1200 05/08/11 1300 05/08/11 1400  BP:  100/42 102/44 135/66  Pulse:  95 79 102  Temp: 98.3 F (36.8 C)     TempSrc: Oral     Resp:  13 20 20   Height:      Weight:      SpO2:  99% 96% 96%    Intake/Output Summary (Last 24 hours) at 05/08/11 1412 Last data filed at 05/08/11 1400  Gross per 24 hour  Intake 1786.67 ml  Output   3535 ml  Net -1748.33 ml    LABS: Basic Metabolic Panel:  Basename 05/08/11 0440 05/07/11 0425  NA 139 139  K 4.0 3.6  CL 108 105  CO2 29 27  GLUCOSE 118* 192*  BUN 22 20  CREATININE 1.39* 1.57*  CALCIUM 7.2* 7.0*  MG 1.9 1.8  PHOS 2.6 3.5   CBC:  Basename 05/08/11 0440 05/07/11 1230 05/07/11 0425  WBC 7.8 -- 9.0  NEUTROABS -- -- --  HGB 8.6* 7.2* --  HCT 24.6* 20.7* --  MCV 89.1 -- 85.1  PLT 43* -- 56*   Cardiac Enzymes:  Basename 05/06/11 0925 05/05/11 1553  CKTOTAL 518* 82  CKMB 8.4* 1.9  CKMBINDEX -- --  TROPONINI 4.21* <0.30   Radiology/Studies:  No evidence of CHF on chest x-ray  Physical Exam: Blood pressure 135/66, pulse 102, temperature 98.3 F (36.8 C), temperature source Oral, resp. rate 20, height 5\' 4"  (1.626 m), weight 72.9 kg (160 lb 11.5 oz), SpO2 96.00%. Weight change: -4.1 kg (-9 lb 0.6 oz)   1 of 6 systolic murmur at the apex and into the left axilla.  The neck veins are flat with the patient sitting upright in a chair.  The lungs are clear to auscultation and percussion.  Both lower extremities are warm and without evidence of cyanosis.  The patient is alert, conversant, and exhibits no focal neurological deficits.  ASSESSMENT:  1. Non-ST elevation myocardial infarction, type I, secondary to stress of anemia and hypotension.  2. Hypotension  resolved  3. Anemia improved  4. Chronic diastolic heart failure without evidence of decompensation    Plan:  1. Keep hemoglobin greater than 9.0 if possible  2. Resume usual cardiac medication when he is allowed to take oral therapy.  3. Watch for post operative atrial fibrillation.  4. We'll continue to follow with you.  Selinda Eon 05/08/2011, 2:12 PM

## 2011-05-08 NOTE — Evaluation (Signed)
Physical Therapy Evaluation Patient Details Name: [name]. MRN: [id number] DOB: Jul 14, 1924 Today's Date: 05/08/2011  Problem List:  Patient Active Problem List  Diagnoses  . Peripheral vascular disease, unspecified    Past Medical History:  Past Medical History  Diagnosis Date  . Hyperlipidemia     takes Atorvastatin daily  . CAD (coronary artery disease)   . Gastritis   . BPH (benign prostatic hypertrophy)     Dr.Nesi is urologist  . Arthritis   . Claudication   . Hypertension     takes Ramipril and Metoprolol daily  . Heart murmur   . Peripheral vascular disease   . Asthma     as a young child  . Bruises easily     takes Pletal and ASA daily  . Colitis     hx of  . Hx of colonic polyps   . Urinary urgency     takes Flomax daily  . Diabetes mellitus     borderline diabetic  . Blind     right eye  . Insomnia     takes Ambien nightly  . Mental disorder     takes Aricept daily  . History of shingles 3-39yrs ago   Past Surgical History:  Past Surgical History  Procedure Date  . Tonsillectomy 1960  . Coronary artery bypass graft 1989/2001    5 vessels;1 valve   . Valve replacement 2001  . Cardiac catheterization     multiple-see epic  . Eye surgery     cataract left eye  . Hemorrhoid surgery 1960  . Coronary angioplasty     right leg  . Colonoscopy     PT Assessment/Plan/Recommendation PT Assessment H&P: This is an 78yo male with extensive cardiac history including CAD s/p CABG, dyslipidemia, AVR with pericardial tissue valve, hypertension and PVD who presented for axillary bifemoreal bypass.  He had a prolonged surgery and post op had profound hypotension requiring multiple pressors was kept on the ventilator overnight due to high transfusion volume of 4 unites, shock and length of surgery.  PCCM was consulted for assistance with respiratory failure and shock. He had a total of 3 hours with SBP in the 60-80 mmHg range before BP stabilized on  pressors.  Was found to have N-STEMI in the setting of hypotension. Cardiology following.  Clinical Impression Statement: Presents to PT today with generalized weakness, immobility and decreased balance secondary to surgery and further complications. Will bnefit physical therapy in the acute setting to address these and the following problem list. We will work to maximize mobility and independence  so as to accelerate pt's recovery and decrease length of stay as well as burden of care at next venue. Rec CIR for follow up therapies.  PT Recommendation/Assessment: Patient will need skilled PT in the acute care venue PT Problem List: Decreased strength;Decreased activity tolerance;Decreased balance;Decreased mobility;Decreased coordination;Decreased knowledge of use of DME;Cardiopulmonary status limiting activity Barriers to Discharge: Decreased caregiver support Barriers to Discharge Comments: wife able to provide 24 hour assist but question her ability to handle him at this point PT Therapy Diagnosis : Difficulty walking;Abnormality of gait;Generalized weakness PT Plan PT Frequency: Min 3X/week PT Treatment/Interventions: DME instruction;Gait training;Functional mobility training;Therapeutic activities;Therapeutic exercise;Stair training;Balance training;Neuromuscular re-education;Patient/family education PT Recommendation Recommendations for Other Services: Rehab consult;OT consult Follow Up Recommendations: Inpatient Rehab Equipment Recommended: Defer to next venue PT Goals  Acute Rehab PT Goals PT Goal Formulation: With patient Time For Goal Achievement: 2 weeks Pt will Roll Supine to Right  Side: with modified independence PT Goal: Rolling Supine to Right Side - Progress: Goal set today Pt will Roll Supine to Left Side: with modified independence PT Goal: Rolling Supine to Left Side - Progress: Goal set today Pt will go Supine/Side to Sit: with modified independence PT Goal: Supine/Side to  Sit - Progress: Goal set today Pt will go Sit to Supine/Side: with modified independence PT Goal: Sit to Supine/Side - Progress: Goal set today Pt will go Sit to Stand: with modified independence PT Goal: Sit to Stand - Progress: Goal set today Pt will go Stand to Sit: with modified independence PT Goal: Stand to Sit - Progress: Goal set today Pt will Transfer Bed to Chair/Chair to Bed: with modified independence PT Transfer Goal: Bed to Chair/Chair to Bed - Progress: Goal set today Pt will Stand: Independently;3 - 5 min;with no upper extremity support PT Goal: Stand - Progress: Goal set today Pt will Ambulate: >150 feet;with modified independence;with least restrictive assistive device PT Goal: Ambulate - Progress: Goal set today Pt will Go Up / Down Stairs: 1-2 stairs;with min assist;with least restrictive assistive device PT Goal: Up/Down Stairs - Progress: Goal set today Pt will Perform Home Exercise Program: Independently PT Goal: Perform Home Exercise Program - Progress: Goal set today  PT Evaluation Precautions/Restrictions  Precautions Precautions: Fall Restrictions Weight Bearing Restrictions: No Prior Functioning  Home Living Lives With: Spouse Receives Help From: Family Type of Home: House Home Layout: One level Home Access: Stairs to enter [employer] of Steps: 1 in the front with no rail; 2 in the back (? rail?) Home Adaptive Equipment: Straight cane Prior Function Level of Independence: Independent with homemaking with ambulation;Independent with basic ADLs;Independent with transfers;Independent with gait Driving: Yes Vocation: Retired Comments: reports he was still mowing his lawn (with riding Surveyor, mining) Cognition Cognition Arousal/Alertness: Awake/alert Overall Cognitive Status: Appears within functional limits for tasks assessed Orientation Level: Oriented X4 (HOH) Sensation/Coordination Sensation Light Touch: Appears  Intact Coordination Gross Motor Movements are Fluid and Coordinated: No Fine Motor Movements are Fluid and Coordinated: No Coordination and Movement Description: pt demonstrating tremulousness especially noted with RUE when resting on RW (pt seated); during gait and initially upon standing pt a bit tremulous with difficulty sequencing reciprocal stepping motion Extremity Assessment RUE Assessment RUE Assessment: Within Functional Limits LUE Assessment LUE Assessment: Within Functional Limits RLE Assessment RLE Assessment:  (grossly 4/5) LLE Assessment LLE Assessment:  (grossly 4/5) Mobility (including Balance) Bed Mobility Bed Mobility: No Transfers Transfers: Yes Sit to Stand: 2: Max assist;From chair/3-in-1 Sit to Stand Details (indicate cue type and reason): max facilitation at hips/sacrum for power up and follow through to stand; cues for sequencing as well as anterior translation of trunk over base of support Stand to Sit: 3: Mod assist;To chair/3-in-1 Stand to Sit Details: assist for slow descent to chair Ambulation/Gait Ambulation/Gait: Yes Ambulation/Gait Assistance: 3: Mod assist Ambulation/Gait Assistance Details (indicate cue type and reason): modA to sequence Rw; initially pt having difficulty coordinating his feet to step with wide BOS, very flexed; as he got going a bit his gait pattern smoothed out a bit but still with increased effort; slight tremulousness noted as well Ambulation Distance (Feet): 10 Feet Assistive device: Rolling walker  Static Standing Balance Static Standing - Balance Support: Bilateral upper extremity supported Static Standing - Level of Assistance: 3: Mod assist Static Standing - Comment/# of Minutes: posterior lean needing facilitation at trunk to bring pt anteriorly Exercise  General Exercises - Lower Extremity Hip  Flexion/Marching: AAROM;Both;10 reps;Standing (in prep for gait) End of Session PT - End of Session Equipment Utilized During  Treatment: Gait belt Activity Tolerance: Patient tolerated treatment well Patient left: in chair;with call bell in reach Nurse Communication: Mobility status for transfers;Mobility status for ambulation General Behavior During Session: [facility] for tasks performed Cognition: [facility] for tasks performed  [facility] [name] 05/08/2011, 4:27 PM

## 2011-05-08 NOTE — Progress Notes (Signed)
VASCULAR LAB PRELIMINARY  ARTERIAL  ABI completed:    RIGHT    LEFT    PRESSURE WAVEFORM  PRESSURE WAVEFORM  BRACHIAL 71 Triphasic BRACHIAL 102 Triphasic  DP 68 Biphasic DP 44 Dampened monophasic         PT 77 Biphasic PT 48 Dampened monophasic                  RIGHT LEFT  ABI 0.75 0.47     Keturah Yerby D, RVS 05/08/2011, 2:32 PM

## 2011-05-08 NOTE — Evaluation (Signed)
Clinical/Bedside Swallow Evaluation Patient Details  Name: [name]. MRN: [id number] DOB: 1925/02/15 Today's Date: 05/08/2011  Past Medical History:  Past Medical History  Diagnosis Date  . Hyperlipidemia     takes Atorvastatin daily  . CAD (coronary artery disease)   . Gastritis   . BPH (benign prostatic hypertrophy)     Dr.Nesi is urologist  . Arthritis   . Claudication   . Hypertension     takes Ramipril and Metoprolol daily  . Heart murmur   . Peripheral vascular disease   . Asthma     as a young child  . Bruises easily     takes Pletal and ASA daily  . Colitis     hx of  . Hx of colonic polyps   . Urinary urgency     takes Flomax daily  . Diabetes mellitus     borderline diabetic  . Blind     right eye  . Insomnia     takes Ambien nightly  . Mental disorder     takes Aricept daily  . History of shingles 3-58yrs ago   Past Surgical History:  Past Surgical History  Procedure Date  . Tonsillectomy 1960  . Coronary artery bypass graft 1989/2001    5 vessels;1 valve   . Valve replacement 2001  . Cardiac catheterization     multiple-see epic  . Eye surgery     cataract left eye  . Hemorrhoid surgery 1960  . Coronary angioplasty     right leg  . Colonoscopy    HPI: 78 year old male with PMH of PVD who presented for an axillary bifemoral bipass. Patient had a prolonged surgery and decision was made to keep patient on the vent overnight due to high transfusion volume (4 units) and length of surgery. PCCM was called to consult for respiratory failure and shock. Pt was intubated for the procedure 3/11-3/13 and has been kept NPO. He denies any h/o dysphagia, GER, PNA, or CVA. A bedside swallow eval was ordered for a diet recommendation.  Assessment/Recommendations/Treatment Plan SLP Assessment Clinical Impression Statement: Pt presents with subtle s/s of aspiration/penetration characterized by an intermittent, delayed throat clear throughout trials.  However, vocal quality remained clear, vital signs remained stable, and hyolaryngeal elevation/excursion appears WFL upon palpation. Given no other indicators for an acute dysphagia other than brief intubation and generalized weakness s/p surgery, question if s/s of aspiration observed at bedside are secondary to irritation s/p extubation rather than aspiration. Overall, pt appears to be protecting his airway. Recommend a dys 1 (puree) diet due to pt preference secondary to missing dentures and thin liqudis with close SLP f/u to monitor diet tolerance. Prognosis to advance diet is good with availability of dentures. Risk for Aspiration: Mild  Swallow Evaluation Recommendations Solid Consistency: Dysphagia 1 (Puree) Liquid Consistency: Thin Liquid Administration via: Cup;No straw Medication Administration: Whole meds with puree Supervision: Patient able to self feed;Full supervision/cueing for compensatory strategies Compensations: Slow rate;Small sips/bites Postural Changes and/or Swallow Maneuvers: Seated upright 90 degrees Oral Care Recommendations: Oral care BID Follow up Recommendations: None  Treatment Plan Treatment Plan Recommendations: Therapy as outlined in treatment plan below Speech Therapy Frequency: min 2x/week Treatment Duration: 2 weeks Interventions: Aspiration precaution training;Compensatory techniques;Patient/family education;Trials of upgraded texture/liquids;Diet toleration management by SLP  Prognosis Prognosis for Safe Diet Advancement: Good  Individuals Consulted Consulted and Agree with Results and Recommendations: Patient;RN  Swallowing Goals  SLP Swallowing Goals Patient will consume recommended diet without observed clinical  signs of aspiration with: Minimal assistance Swallow Study Goal #1 - Progress: Not Met Patient will utilize recommended strategies during swallow to increase swallowing safety with: Minimal assistance Swallow Study Goal #2 - Progress:  Not met  Maxcine Ham 05/08/2011,2:04 PM   Maxcine Ham, SLP Student

## 2011-05-08 NOTE — Progress Notes (Addendum)
[facility] [place]  Progress Note Bypass Surgery  Date of Surgery: 05/05/2011  Procedure(s): Right BYPASS GRAFT AXILLA-BIFEMORAL ENDARTERECTOMY FEMORAL BYPASS GRAFT FEMORAL-POPLITEAL ARTERY ENDARTERECTOMY POPLITEAL Surgeon: Surgeon(s): Sherren Kerns, MD Larina Earthly, MD  3 Days Post-Op  History of Present Illness  Mark Bartles. is a 78 y.o. male who is now extubated and breathing comfortably. Still with gen edema. The patient's pre-op symptoms of pain are Improved . Patients incisional pain is well controlled.    VASC. LAB Studies:        ABI: re-ordered   Significant Diagnostic Studies: CBC Lab Results  Component Value Date   WBC 7.8 05/08/2011   HGB 8.6* 05/08/2011   HCT 24.6* 05/08/2011   MCV 89.1 05/08/2011   PLT 43* 05/08/2011    BMET @LASTCHEMISTRY @     Component Value Date/Time   NA 139 05/08/2011 0440   K 4.0 05/08/2011 0440   CL 108 05/08/2011 0440   CO2 29 05/08/2011 0440   GLUCOSE 118* 05/08/2011 0440   BUN 22 05/08/2011 0440   CREATININE 1.39* 05/08/2011 0440   CALCIUM 7.2* 05/08/2011 0440   GFRNONAA 45* 05/08/2011 0440   GFRAA 52* 05/08/2011 0440    COAG Lab Results  Component Value Date   INR 1.22 05/02/2011   No results found for this basename: PTT    Physical Examination  BP Readings from Last 3 Encounters:  05/08/11 135/48  05/08/11 135/48  05/02/11 100/60   Temp Readings from Last 3 Encounters:  05/08/11 99 F (37.2 C) Oral  05/08/11 99 F (37.2 C) Oral  05/02/11 97 F (36.1 C)    SpO2 Readings from Last 3 Encounters:  05/08/11 96%  05/08/11 96%  05/02/11 95%   Pulse Readings from Last 3 Encounters:  05/08/11 99  05/08/11 99  05/02/11 85    Pt is A&O x 3 right upper, right lower extremity: Incision/s is/are clean,dry.intact, and  healing with hematoma, no erythema or drainage Limbs are warm; with good color  Right Dorsalis Pedis pulse is monophasic by Doppler RightPosterior tibial pulse is   monophasic by Doppler  Left Dorsalis Pedis pulse is weak and monophasic by Doppler LeftPosterior tibial pulse is  monophasic by Doppler   Assessment/Plan: Pt. Doing well Post-op anemia -acute blood loss improved after 2UPC yesterday Strict I/O will need foley one more day to assess U/O with diuresis. Pt also has BPH and is very weak. Post-op pain is controlled Wounds are healing well Lasix for gen edema PT/OT for ambulation Continue wound care as ordered   [name] [phone] 05/08/2011 7:40 AM        I have examined the patient, reviewed and agree with above. Comfortable up in chair. Surgical incisions healing. 2+ popliteal graft pulse.  Mark Dollard, MD 05/08/2011 1:30 PM

## 2011-05-08 NOTE — Progress Notes (Addendum)
HPI:  78 year old male with PMH of PVD who presented for an axillary bifemoral bipass. Patient had a prolonged surgery and decision was made to keep patient on the vent overnight due to high transfusion volume (4 units) and length of surgery. PCCM was called to consult for respiratory failure and shock.  Antibiotics:   Cefuroxime 3/11>>>  Cultures/Sepsis Markers:   None  Access/Protocols:  L IJ TLC 3/11>>>  R radial a-line 3/11>>>  ET tube 3/11>>>  Best Practice: DVT: SCD's GI: Protonix  Subjective: No events overnight, on RA and speaking full sentences.  Physical Exam: Filed Vitals:   05/08/11 0900  BP: 133/55  Pulse: 97  Temp:   Resp: 10    Intake/Output Summary (Last 24 hours) at 05/08/11 0939 Last data filed at 05/08/11 0846  Gross per 24 hour  Intake 991.88 ml  Output   2935 ml  Net -1943.12 ml   Vent Mode:  [-] CPAP FiO2 (%):  [30 %-30.1 %] 30.1 % Vt Set:  [1 mL] 1 mL PEEP:  [0 cmH20-5 cmH20] 0 cmH20 Pressure Support:  [5 cmH20] 5 cmH20  Neuro: Alert and oriented, following commands. Cardiac: RRR, Nl S1/S2, -M/R/G. Pulmonary: Diffuse crackles but improving, no ET tube. GI: Soft, NT, ND and +BS. Extremities: Diminished pulses and no edema.  Labs: CBC    Component Value Date/Time   WBC 7.8 05/08/2011 0440   RBC 2.76* 05/08/2011 0440   HGB 8.6* 05/08/2011 0440   HCT 24.6* 05/08/2011 0440   PLT 43* 05/08/2011 0440   MCV 89.1 05/08/2011 0440   MCH 31.2 05/08/2011 0440   MCHC 35.0 05/08/2011 0440   RDW 15.3 05/08/2011 0440   LYMPHSABS 0.9 05/02/2011 1157   MONOABS 0.4 05/02/2011 1157   EOSABS 0.2 05/02/2011 1157   BASOSABS 0.0 05/02/2011 1157   BMET    Component Value Date/Time   NA 139 05/08/2011 0440   K 4.0 05/08/2011 0440   CL 108 05/08/2011 0440   CO2 29 05/08/2011 0440   GLUCOSE 118* 05/08/2011 0440   BUN 22 05/08/2011 0440   CREATININE 1.39* 05/08/2011 0440   CALCIUM 7.2* 05/08/2011 0440   GFRNONAA 45* 05/08/2011 0440   GFRAA 52* 05/08/2011 0440   ABG      Component Value Date/Time   PHART 7.424 05/07/2011 1147   PCO2ART 42.4 05/07/2011 1147   PO2ART 97.0 05/07/2011 1147   HCO3 27.7* 05/07/2011 1147   TCO2 29 05/07/2011 1147   ACIDBASEDEF 15.0* 05/05/2011 2032   O2SAT 98.0 05/07/2011 1147    Lab 05/08/11 0440  MG 1.9   Lab Results  Component Value Date   CALCIUM 7.2* 05/08/2011   PHOS 2.6 05/08/2011   Chest Xray:   Assessment & Plan: 78 year old male with history of PVD, diabetes, asthma and HTN who presents to the PCCM service after a prolonged surgery as above. Patient is profoundly hypotensive requiring multiple pressors that are being infused peripherally for now.   1. Neuro: D/C sedation, dilaudid for pain control only.  2. Cardiovascular: D/C levophed and IVF, continue diureses today. 3. Pulmonary: Titrate O2 for sat of 88-92%, IS and flutter valve.  Now on room air.  4. GI: Swallow evaluation passed, continue diet.  5. Renal: History of renal insufficiency. Lasix again today with again Mg, Phos and K replacement. 6. ID: no evidence of infection, will continue cefuroxime but no indications for cultures or additional Abx at this time.  7. Endocrine: will change to regular ISS.  PCCM signing off, please call back if needed.  Koren Bound, MD [phone]

## 2011-05-09 DIAGNOSIS — R5381 Other malaise: Secondary | ICD-10-CM

## 2011-05-09 DIAGNOSIS — J9600 Acute respiratory failure, unspecified whether with hypoxia or hypercapnia: Secondary | ICD-10-CM

## 2011-05-09 LAB — BASIC METABOLIC PANEL
BUN: 28 mg/dL — ABNORMAL HIGH (ref 6–23)
CO2: 29 mEq/L (ref 19–32)
Chloride: 104 mEq/L (ref 96–112)
Glucose, Bld: 139 mg/dL — ABNORMAL HIGH (ref 70–99)
Potassium: 3.5 mEq/L (ref 3.5–5.1)
Sodium: 138 mEq/L (ref 135–145)

## 2011-05-09 LAB — CBC
HCT: 22.8 % — ABNORMAL LOW (ref 39.0–52.0)
Hemoglobin: 7.9 g/dL — ABNORMAL LOW (ref 13.0–17.0)
RBC: 2.56 MIL/uL — ABNORMAL LOW (ref 4.22–5.81)
WBC: 6.9 10*3/uL (ref 4.0–10.5)

## 2011-05-09 LAB — GLUCOSE, CAPILLARY: Glucose-Capillary: 114 mg/dL — ABNORMAL HIGH (ref 70–99)

## 2011-05-09 LAB — PHOSPHORUS: Phosphorus: 2.5 mg/dL (ref 2.3–4.6)

## 2011-05-09 MED ORDER — SODIUM CHLORIDE 0.9 % IJ SOLN
INTRAMUSCULAR | Status: AC
  Filled 2011-05-09: qty 30

## 2011-05-09 MED ORDER — POTASSIUM CHLORIDE CRYS ER 20 MEQ PO TBCR
40.0000 meq | EXTENDED_RELEASE_TABLET | Freq: Once | ORAL | Status: AC
  Administered 2011-05-09: 40 meq via ORAL
  Filled 2011-05-09: qty 2

## 2011-05-09 MED ORDER — SODIUM CHLORIDE 0.9 % IJ SOLN
INTRAMUSCULAR | Status: AC
  Administered 2011-05-09: 10 mL
  Filled 2011-05-09: qty 20

## 2011-05-09 MED ORDER — FUROSEMIDE 10 MG/ML IJ SOLN: 40.0000 mg | Freq: Once | INTRAMUSCULAR | Status: DC

## 2011-05-09 MED ORDER — FUROSEMIDE 10 MG/ML IJ SOLN
20.0000 mg | Freq: Once | INTRAMUSCULAR | Status: AC
  Administered 2011-05-09: 20 mg via INTRAVENOUS
  Filled 2011-05-09: qty 2

## 2011-05-09 NOTE — Progress Notes (Signed)
Speech Pathology: Dysphagia Treatment Note  Subjective: Awake, alert, sitting upright in recliner  Objective: Treatment focused on assessment of upgraded PO textures now that dentures are in place.  Skilled observation of thin liquids were without any clinical indication of compromised airway protection.  Solid cracker with adequate mastication and bolus manipulation with some mild residue under denture, patient stating "this is why I don't eat crackers."    Assessment: Functional oral-pharyngeal swallow with regular solids and thin liquids.  Recommendations:  1. Upgrade diet to Regular/Thin with any dietary restrictions per Dietician/MD. 2. No further skilled SLP services warranted.  Pain:   none Intervention Required:   No  Goals: All Goals Met  Myra Rude, M.S.,CCC-SLP Pager [phone]

## 2011-05-09 NOTE — Progress Notes (Addendum)
Nutrition Follow-up  Diet Order:  Dys 1, Thin liquids  Patient underwent bypass graft axilla-bifemoral and femoral-popliteal surgery  on 3/11. Post-op wounds on chest, groin, shoulder and leg are currently healing properly per MD note.  Patient was extubated on 3/13. Continues to have edema, is being treated with Lasix.   Was evaluated by SLP. Recommended puree diet due to patient preferences related to missing dentures (3/13). Patient currently has dentures in place. Spoke with SLP, plan to advance patient to regular diet.  No BMs documented. Positive for flatus.  Meds: Scheduled Meds:   . aspirin EC  325 mg Oral Daily  . atorvastatin  40 mg Oral Daily  . chlorhexidine  15 mL Mouth Rinse BID  . cilostazol  100 mg Oral BID  . docusate sodium  100 mg Oral Daily  . donepezil  10 mg Oral Daily  . ezetimibe  10 mg Oral QHS  . fesoterodine  4 mg Oral Daily  . furosemide  20 mg Intravenous Once  . insulin aspart  0-3 Units Subcutaneous TID WC  . magnesium sulfate 1 - 4 g bolus IVPB  2 g Intravenous Once  . metoprolol succinate  37.5 mg Oral BID  . potassium chloride  40 mEq Oral Once  . potassium phosphate IVPB (mmol)  20 mmol Intravenous Once  . ramipril  5 mg Oral Daily  . sodium chloride      . sodium chloride      . Tamsulosin HCl  0.4 mg Oral QHS  . DISCONTD: furosemide  40 mg Intravenous Once   Continuous Infusions:  PRN Meds:.acetaminophen, acetaminophen, HYDROmorphone (DILAUDID) injection, nitroGLYCERIN, ondansetron, phenol, zolpidem  Labs:  CMP     Component Value Date/Time   NA 138 05/09/2011 0420   K 3.5 05/09/2011 0420   CL 104 05/09/2011 0420   CO2 29 05/09/2011 0420   GLUCOSE 139* 05/09/2011 0420   BUN 28* 05/09/2011 0420   CREATININE 1.39* 05/09/2011 0420   CALCIUM 7.5* 05/09/2011 0420   PROT 2.6* 05/05/2011 1703   ALBUMIN 1.8* 05/05/2011 1703   AST 8 05/05/2011 1703   ALT 5 05/05/2011 1703   ALKPHOS 22* 05/05/2011 1703   BILITOT 0.8 05/05/2011 1703   GFRNONAA 45*  05/09/2011 0420   GFRAA 52* 05/09/2011 0420  Phosphorus 2.5 WNL (3/15) Magnesium 2.4 WNL (3/15)  CBG (last 3)   Basename 05/09/11 0840 05/08/11 2154 05/08/11 1956  GLUCAP 156* 125* 141*     Intake/Output Summary (Last 24 hours) at 05/09/11 0955 Last data filed at 05/09/11 0500  Gross per 24 hour  Intake 1096.67 ml  Output   2675 ml  Net -1578.33 ml     He was slightly disoriented but reported a good appetite, has been able to eat most of his meals, and denied any weight loss. Supplementation at this time does not seem necessary.  Weight Status:   Wt Readings from Last 3 Encounters:  05/08/11 160 lb 11.5 oz (72.9 kg)  05/08/11 160 lb 11.5 oz (72.9 kg)  05/02/11 137 lb 4.8 oz (62.279 kg)   Increased weight of 23 lbs since admission most likely related to presence of edema.  Re-estimated needs:  1650-1850 kcal, 83-93 gram protein  Nutrition Dx: Inadequate oral intake (NI-2.1), Status: progressing   Goal:  EN to meet >90% of estimated nutrition needs to preserve lean body mass, not applicable at this time New Goal: Consume >/= 90% of estimated needs  Intervention:    Monitor PO intake of regular  diet to reassess need for supplementation  RD to follow nutrition care plan  Monitor: Diet advancement, PO intake, weights, labs   Lloyd Huger Pager #:  [phone]  Hettie Holstein [phone]

## 2011-05-09 NOTE — Progress Notes (Addendum)
[facility] [place]  Progress Note Bypass Surgery  Date of Surgery: 05/05/2011  Procedure(s): Right BYPASS GRAFT AXILLA-BIFEMORAL ENDARTERECTOMY FEMORAL BYPASS GRAFT FEMORAL-POPLITEAL ARTERY ENDARTERECTOMY POPLITEAL Surgeon: Surgeon(s): Sherren Kerns, MD Larina Earthly, MD  4 Days Post-Op  History of Present Illness  Mark Tabet. is a 78 y.o. male is C/O pain in tunnel and incision sites. He is breathing comfortably.  VASC. LAB Studies:        ABI: still pending   Imaging: Dg Chest Port 1 View  05/08/2011  *RADIOLOGY REPORT*  Clinical Data: Endotracheal tube placement, for aorto bifemoral surgery  PORTABLE CHEST - 1 VIEW  Comparison: Portable chest x-ray of 05/07/2011  Findings: The endotracheal tube has been removed.  The lungs remain relatively well aerated.  Left central venous line tip remains in the SVC - RA junction.  Cardiomegaly is stable.  No pneumothorax is seen.  No bony abnormality is noted.  IMPRESSION: Endotracheal tube removed.  No change in aeration.  Left central venous line tip remains at the SVC/RA junction.  Original Report Authenticated By: Juline Patch, M.D.    Significant Diagnostic Studies: CBC Lab Results  Component Value Date   WBC 6.9 05/09/2011   HGB 7.9* 05/09/2011   HCT 22.8* 05/09/2011   MCV 89.1 05/09/2011   PLT 46* 05/09/2011    BMET @LASTCHEMISTRY @     Component Value Date/Time   NA 138 05/09/2011 0420   K 3.5 05/09/2011 0420   CL 104 05/09/2011 0420   CO2 29 05/09/2011 0420   GLUCOSE 139* 05/09/2011 0420   BUN 28* 05/09/2011 0420   CREATININE 1.39* 05/09/2011 0420   CALCIUM 7.5* 05/09/2011 0420   GFRNONAA 45* 05/09/2011 0420   GFRAA 52* 05/09/2011 0420    COAG Lab Results  Component Value Date   INR 1.22 05/02/2011   No results found for this basename: PTT    Physical Examination  BP Readings from Last 3 Encounters:  05/09/11 103/35  05/09/11 103/35  05/02/11 100/60   Temp Readings from Last 3  Encounters:  05/09/11 98.1 F (36.7 C) Oral  05/09/11 98.1 F (36.7 C) Oral  05/02/11 97 F (36.1 C)    SpO2 Readings from Last 3 Encounters:  05/08/11 94%  05/08/11 94%  05/02/11 95%   Pulse Readings from Last 3 Encounters:  05/08/11 91  05/08/11 91  05/02/11 85    Pt is A&O x 3 right upper, right lower extremity: Incision/s is/are clean,dry.intact, and  healing with hematoma, erythema at lateral tunnel as expected  Limb is warm; with good color, good sensation and motion  Right AT pulse is monophasic by Doppler RightPosterior tibial pulse is  monophasic by Doppler  Left Dorsalis Pedis pulse is absent LeftPosterior tibial pulse is  monophasic by Doppler   Assessment/Plan: Pt. Doing well Diuresing but still 5 liters positive H/H down again - transfuse  as per Card Request to keep Hgb>9 Post-op pain is controlled Wounds are healing well PT/OT for ambulation  repeat labs in am Replace K+ today Lasix for diuresis after transfusion Thrombocytopenia - stable   ROCZNIAK,REGINA J [phone] 05/09/2011 8:12 AM        I have examined the patient, reviewed and agree with above. Patient continues to improve. He is anemic with surgery and is receiving 2 additional units of blood. He is alert oriented has a well-perfused feet bilaterally  Mark Hengel, MD 05/09/2011 4:45 PM

## 2011-05-09 NOTE — Progress Notes (Signed)
Pt confused, consent signed in chart for blood tx, wife also notified of MD order to receive blood and stated ok

## 2011-05-09 NOTE — Progress Notes (Signed)
Patient Name: [name]. Date of Encounter: 05/09/2011    SUBJECTIVE: He is having a lot of pain in his left lateral leg.  TELEMETRY:  Normal sinus rhythm and sinus tachycardia.: Filed Vitals:   05/08/11 2200 05/08/11 2300 05/09/11 0300 05/09/11 0728  BP: 90/41 118/38 103/35 127/47  Pulse: 91 90  93  Temp:  98.4 F (36.9 C) 98.1 F (36.7 C) 98.2 F (36.8 C)  TempSrc:  Oral Oral Oral  Resp:  20  19  Height:      Weight:      SpO2:  97%  98%    Intake/Output Summary (Last 24 hours) at 05/09/11 1052 Last data filed at 05/09/11 0500  Gross per 24 hour  Intake 926.67 ml  Output   1950 ml  Net -1023.33 ml    LABS: Basic Metabolic Panel:  Basename 05/09/11 0420 05/08/11 0440  NA 138 139  K 3.5 4.0  CL 104 108  CO2 29 29  GLUCOSE 139* 118*  BUN 28* 22  CREATININE 1.39* 1.39*  CALCIUM 7.5* 7.2*  MG 2.4 1.9  PHOS 2.5 2.6   CBC:  Basename 05/09/11 0420 05/08/11 1619  WBC 6.9 9.4  NEUTROABS -- --  HGB 7.9* 9.3*  HCT 22.8* 26.7*  MCV 89.1 89.3  PLT 46* 49*   Radiology/Studies:  Not reviewed.  Physical Exam: Blood pressure 127/47, pulse 93, temperature 98.2 F (36.8 C), temperature source Oral, resp. rate 19, height 5\' 4"  (1.626 m), weight 72.9 kg (160 lb 11.5 oz), SpO2 98.00%. Weight change:    Moderate JVD.  Diminished basilar breath sounds.  Cardiac exam is unremarkable.  ASSESSMENT:  1. Hemodynamically stable without hypotension and no significant arrhythmias.  2. Seems to be near euvolemic.  3. Coronary disease with type II non-ST elevation MI, currently stable and not exhibiting any evidence of ongoing ischemia   Plan:  1. Will continue to monitor his clinical course.  2. Still at risk for postoperative arrhythmias  3. Mobilize as indicated  Selinda Eon 05/09/2011, 10:52 AM

## 2011-05-09 NOTE — Consult Note (Signed)
Physical Medicine and Rehabilitation Consult Reason for Consult: PVD. Deconditioning Referring Phsyician:  Dr. Darrick Penna    HPI: [name]. is an 78 y.o. male with history of CAD, progressive arterial occlusive disease BLE with claudication and rest pain bilateral feet.  Patient elected to under go axillo-bifemoral BPG by Dr. Darrick Penna on 03/11.  Post op with shock and respiratory failure requiring pressors.  PCCM consulted for vent management due to high volume transfusion and length of surgery.  Was noted to have elevated cardiac enzymes post op Cardiology felt  Patient with NSTEMI due to demand ischemia in setting of profound hypotension.  Cardiology recommends close  Monitoring for post op arrhthymias. Acute on chronic fluid overload managed by IV diuretics as tolerated by BP.  Extubated 3/13 and PT evaluation done on 03/14. Patient with generalized weakness and decreased balance. MD, PT recommending CIR.   Review of Systems  Constitutional: Negative for malaise/fatigue.  HENT: Positive for hearing loss.   Eyes: Negative for blurred vision and double vision.  Respiratory: Negative for shortness of breath.   Cardiovascular: Negative for chest pain and palpitations.  Gastrointestinal: Negative for heartburn.  Musculoskeletal:       LLE pain thigh down.  Neurological: Negative for headaches.  All other systems reviewed and are negative.   Past Medical History  Diagnosis Date  . Hyperlipidemia     takes Atorvastatin daily  . CAD (coronary artery disease)   . Gastritis   . BPH (benign prostatic hypertrophy)     Dr.Nesi is urologist  . Arthritis   . Claudication   . Hypertension     takes Ramipril and Metoprolol daily  . Heart murmur   . Peripheral vascular disease   . Asthma     as a young child  . Bruises easily     takes Pletal and ASA daily  . Colitis     hx of  . Hx of colonic polyps   . Urinary urgency     takes Flomax daily  . Diabetes mellitus     borderline  diabetic  . Blind     right eye  . Insomnia     takes Ambien nightly  . Mental disorder     takes Aricept daily  . History of shingles 3-27yrs ago   Past Surgical History  Procedure Date  . Tonsillectomy 1960  . Coronary artery bypass graft 1989/2001    5 vessels;1 valve   . Valve replacement 2001  . Cardiac catheterization     multiple-see epic  . Eye surgery     cataract left eye  . Hemorrhoid surgery 1960  . Coronary angioplasty     right leg  . Colonoscopy    Family History  Problem Relation Age of Onset  . Heart disease Father   . Anesthesia problems Neg Hx   . Hypotension Neg Hx   . Malignant hyperthermia Neg Hx   . Pseudochol deficiency Neg Hx    Social History:  Married.  Retired from [employer]. Independent with a cane PTA. Could walk half a block before claudication symptoms. He reports that he quit smoking about 75 years ago. He does not have any smokeless tobacco history on file. He reports that he does not drink alcohol or use illicit drugs.   Allergies  Allergen Reactions  . Shrimp (Shellfish Allergy) Nausea And Vomiting    Prior to Admission medications   Medication Sig Start Date End Date Taking? Authorizing Provider  aspirin EC 325  MG tablet Take 325 mg by mouth daily.   Yes Historical Provider, MD  atorvastatin (LIPITOR) 40 MG tablet Take 40 mg by mouth daily.   Yes Historical Provider, MD  cilostazol (PLETAL) 100 MG tablet Take 100 mg by mouth 2 (two) times daily.    Yes Historical Provider, MD  donepezil (ARICEPT) 10 MG tablet Take 10 mg by mouth daily.   Yes Historical Provider, MD  ezetimibe (ZETIA) 10 MG tablet Take 10 mg by mouth at bedtime.     Yes Historical Provider, MD  fesoterodine (TOVIAZ) 4 MG TB24 Take 4 mg by mouth daily.     Yes Historical Provider, MD  metoprolol succinate (TOPROL-XL) 25 MG 24 hr tablet Take 37.5 mg by mouth 2 (two) times daily.    Yes Historical Provider, MD  nitroGLYCERIN (NITROSTAT) 0.4 MG SL tablet Place 0.4 mg  under the tongue every 5 (five) minutes as needed. For chest pain   Yes Historical Provider, MD  ramipril (ALTACE) 5 MG tablet Take 5 mg by mouth daily.     Yes Historical Provider, MD  Tamsulosin HCl (FLOMAX) 0.4 MG CAPS Take 0.4 mg by mouth at bedtime.    Yes Historical Provider, MD  zolpidem (AMBIEN) 10 MG tablet Take 10 mg by mouth at bedtime as needed. For sleep   Yes Historical Provider, MD   Scheduled Medications:    . aspirin EC  325 mg Oral Daily  . atorvastatin  40 mg Oral Daily  . chlorhexidine  15 mL Mouth Rinse BID  . cilostazol  100 mg Oral BID  . docusate sodium  100 mg Oral Daily  . donepezil  10 mg Oral Daily  . ezetimibe  10 mg Oral QHS  . fesoterodine  4 mg Oral Daily  . furosemide  20 mg Intravenous Once  . insulin aspart  0-3 Units Subcutaneous TID WC  . metoprolol succinate  37.5 mg Oral BID  . potassium chloride  40 mEq Oral Once  . potassium phosphate IVPB (mmol)  20 mmol Intravenous Once  . ramipril  5 mg Oral Daily  . sodium chloride      . sodium chloride      . Tamsulosin HCl  0.4 mg Oral QHS  . DISCONTD: furosemide  40 mg Intravenous Once   PRN MED's: acetaminophen, acetaminophen, HYDROmorphone (DILAUDID) injection, nitroGLYCERIN, ondansetron, phenol, zolpidem  Home: Home Living Lives With: Spouse Receives Help From: Family Type of Home: House Home Layout: One level Home Access: Stairs to enter [employer] of Steps: 1 in the front with no rail; 2 in the back (? rail?) Home Adaptive Equipment: Straight cane  Functional History: Prior Function Level of Independence: Independent with homemaking with ambulation;Independent with basic ADLs;Independent with transfers;Independent with gait Driving: Yes Vocation: Retired Comments: reports he was still mowing his lawn (with [employer])  Functional Status:  Mobility: Bed Mobility Bed Mobility: No Transfers Transfers: Yes Sit to Stand: 2: Max assist;From chair/3-in-1 Sit to  Stand Details (indicate cue type and reason): max facilitation at hips/sacrum for power up and follow through to stand; cues for sequencing as well as anterior translation of trunk over base of support Stand to Sit: 3: Mod assist;To chair/3-in-1 Stand to Sit Details: assist for slow descent to chair Ambulation/Gait Ambulation/Gait: Yes Ambulation/Gait Assistance: 3: Mod assist Ambulation/Gait Assistance Details (indicate cue type and reason): modA to sequence Rw; initially pt having difficulty coordinating his feet to step with wide BOS, very flexed; as he got going a  bit his gait pattern smoothed out a bit but still with increased effort; slight tremulousness noted as well Ambulation Distance (Feet): 10 Feet Assistive device: Rolling walker    ADL:    Cognition: Cognition Arousal/Alertness: Awake/alert Orientation Level: Oriented to person;Oriented to place;Oriented to situation Cognition Arousal/Alertness: Awake/alert Overall Cognitive Status: Appears within functional limits for tasks assessed Orientation Level: Oriented to person;Oriented to place;Oriented to situation  Blood pressure 127/47, pulse 93, temperature 98.2 F (36.8 C), temperature source Oral, resp. rate 19, height 5\' 4"  (1.626 m), weight 72.9 kg (160 lb 11.5 oz), SpO2 98.00%. Physical Exam  Nursing note and vitals reviewed. Constitutional: He is oriented to person, place, and time. He appears well-developed and well-nourished.  HENT:  Head: Normocephalic and atraumatic.  Eyes: EOM are normal. Pupils are equal, round, and reactive to light.  Neck: Normal range of motion. Neck supple.  Cardiovascular: Normal rate and regular rhythm.   Murmur heard. Pulmonary/Chest: He has decreased breath sounds in the left middle field and the left lower field. He has wheezes.  Abdominal: Soft. Bowel sounds are normal.  Musculoskeletal: He exhibits edema (1+ right shin.) and tenderness.  Neurological: He is alert and oriented to  person, place, and time.  Reflex Scores:      Tricep reflexes are 1+ on the right side and 1+ on the left side.      Bicep reflexes are 1+ on the right side and 1+ on the left side.      Brachioradialis reflexes are 1+ on the right side and 1+ on the left side.      Patellar reflexes are 1+ on the right side and 1+ on the left side.      Achilles reflexes are 1+ on the right side and 1+ on the left side.      Patient is generally appropriate. He follows all simple commands. Seems to have reasonable recall of biographical information. Sensation grossly intact in all 4 limbs. Strength grossly 3+ to 4/5 proximal distal with more pain in addition proximally in the lower extremities.  Skin:       Multiple wounds clean and intact.  Psychiatric: He has a normal mood and affect. His behavior is normal. Thought content normal.    Results for orders placed during the hospital encounter of 05/05/11 (from the past 24 hour(s))  CBC     Status: Abnormal   Collection Time   05/08/11  4:19 PM      Component Value Range   WBC 9.4  4.0 - 10.5 (K/uL)   RBC 2.99 (*) 4.22 - 5.81 (MIL/uL)   Hemoglobin 9.3 (*) 13.0 - 17.0 (g/dL)   HCT 40.9 (*) 81.1 - 52.0 (%)   MCV 89.3  78.0 - 100.0 (fL)   MCH 31.1  26.0 - 34.0 (pg)   MCHC 34.8  30.0 - 36.0 (g/dL)   RDW 91.4  78.2 - 95.6 (%)   Platelets 49 (*) 150 - 400 (K/uL)  GLUCOSE, CAPILLARY     Status: Abnormal   Collection Time   05/08/11  6:29 PM      Component Value Range   Glucose-Capillary 154 (*) 70 - 99 (mg/dL)   Comment 1 Documented in Chart     Comment 2 Notify RN    GLUCOSE, CAPILLARY     Status: Abnormal   Collection Time   05/08/11  7:56 PM      Component Value Range   Glucose-Capillary 141 (*) 70 - 99 (mg/dL)  GLUCOSE, CAPILLARY  Status: Abnormal   Collection Time   05/08/11  9:54 PM      Component Value Range   Glucose-Capillary 125 (*) 70 - 99 (mg/dL)   Comment 1 Documented in Chart     Comment 2 Notify RN    CBC     Status: Abnormal    Collection Time   05/09/11  4:20 AM      Component Value Range   WBC 6.9  4.0 - 10.5 (K/uL)   RBC 2.56 (*) 4.22 - 5.81 (MIL/uL)   Hemoglobin 7.9 (*) 13.0 - 17.0 (g/dL)   HCT 40.9 (*) 81.1 - 52.0 (%)   MCV 89.1  78.0 - 100.0 (fL)   MCH 30.9  26.0 - 34.0 (pg)   MCHC 34.6  30.0 - 36.0 (g/dL)   RDW 91.4  78.2 - 95.6 (%)   Platelets 46 (*) 150 - 400 (K/uL)  BASIC METABOLIC PANEL     Status: Abnormal   Collection Time   05/09/11  4:20 AM      Component Value Range   Sodium 138  135 - 145 (mEq/L)   Potassium 3.5  3.5 - 5.1 (mEq/L)   Chloride 104  96 - 112 (mEq/L)   CO2 29  19 - 32 (mEq/L)   Glucose, Bld 139 (*) 70 - 99 (mg/dL)   BUN 28 (*) 6 - 23 (mg/dL)   Creatinine, Ser 2.13 (*) 0.50 - 1.35 (mg/dL)   Calcium 7.5 (*) 8.4 - 10.5 (mg/dL)   GFR calc non Af Amer 45 (*) >90 (mL/min)   GFR calc Af Amer 52 (*) >90 (mL/min)  MAGNESIUM     Status: Normal   Collection Time   05/09/11  4:20 AM      Component Value Range   Magnesium 2.4  1.5 - 2.5 (mg/dL)  PHOSPHORUS     Status: Normal   Collection Time   05/09/11  4:20 AM      Component Value Range   Phosphorus 2.5  2.3 - 4.6 (mg/dL)  GLUCOSE, CAPILLARY     Status: Abnormal   Collection Time   05/09/11  8:40 AM      Component Value Range   Glucose-Capillary 156 (*) 70 - 99 (mg/dL)   Comment 1 Documented in Chart     Comment 2 Notify RN    TYPE AND SCREEN     Status: Normal (Preliminary result)   Collection Time   05/09/11 10:00 AM      Component Value Range   ABO/RH(D) A POS     Antibody Screen NEG     Sample Expiration 05/12/2011     Unit Number [id number]     Blood Component Type RED CELLS,LR     Unit division 00     Status of Unit ISSUED     Transfusion Status OK TO TRANSFUSE     Crossmatch Result Compatible     Unit Number [id number]     Blood Component Type RED CELLS,LR     Unit division 00     Status of Unit ALLOCATED     Transfusion Status OK TO TRANSFUSE     Crossmatch Result Compatible    PREPARE RBC (CROSSMATCH)      Status: Normal   Collection Time   05/09/11 10:00 AM      Component Value Range   Order Confirmation ORDER PROCESSED BY BLOOD BANK     Dg Chest Port 1 View  05/08/2011  *RADIOLOGY REPORT*  Clinical Data: Endotracheal tube  placement, for aorto bifemoral surgery  PORTABLE CHEST - 1 VIEW  Comparison: Portable chest x-ray of 05/07/2011  Findings: The endotracheal tube has been removed.  The lungs remain relatively well aerated.  Left central venous line tip remains in the SVC - RA junction.  Cardiomegaly is stable.  No pneumothorax is seen.  No bony abnormality is noted.  IMPRESSION: Endotracheal tube removed.  No change in aeration.  Left central venous line tip remains at the SVC/RA junction.  Original Report Authenticated By: Juline Patch, M.D.    Assessment/Plan: Diagnosis: Deconditioning related to recent bypass grafting and respiratory failure 1. Does the need for close, 24 hr/day medical supervision in concert with the patient's rehab needs make it unreasonable for this patient to be served in a less intensive setting? No and Potentially 2. Co-Morbidities requiring supervision/potential complications: CAD, arthritis, hypertension 3. Due to bladder management, bowel management, safety, skin/wound care, disease management, medication administration, pain management and patient education, does the patient require 24 hr/day rehab nursing? No and Potentially 4. Does the patient require coordinated care of a physician, rehab nurse, PT (1-2 hrs/day, 5 days/week) and OT (1-2 hrs/day, 5 days/week) to address physical and functional deficits in the context of the above medical diagnosis(es)? No and Potentially Addressing deficits in the following areas: balance, endurance, locomotion, strength, transferring, bowel/bladder control, bathing, dressing, feeding, grooming and toileting 5. Can the patient actively participate in an intensive therapy program of at least 3 hrs of therapy per day at least 5 days  per week? No and Potentially 6. The potential for patient to make measurable gains while on inpatient rehab is fair 7. Anticipated functional outcomes upon discharge from inpatients are super PT, supervision to minimal assistance OT,  8. Estimated rehab length of stay to reach the above functional goals is: 1 week if at all 9. Does the patient have adequate social supports to accommodate these discharge functional goals? Potentially 10. Anticipated D/C setting: Home 11. Anticipated post D/C treatments: HH therapy 12. Overall Rehab/Functional Prognosis: good  RECOMMENDATIONS: This patient's condition is appropriate for continued rehabilitative care in the following setting: [facility] Patient has agreed to participate in recommended program. Potentially Note that insurance prior authorization may be required for reimbursement for recommended care.  Comment: This patient was admitted on March 11 for his bypass graft and courses was complicated by respiratory failure. I feel that he is likely to improve functionally over the next few days especially as pain improves. We'll followup after the weekend to see where he is from I mobility standpoint. If they do bring him he would only be for a brief 7 day stay.   Ranelle Oyster M.D. 05/09/2011

## 2011-05-10 LAB — TYPE AND SCREEN

## 2011-05-10 LAB — BASIC METABOLIC PANEL
CO2: 28 mEq/L (ref 19–32)
Calcium: 8 mg/dL — ABNORMAL LOW (ref 8.4–10.5)
Creatinine, Ser: 1.31 mg/dL (ref 0.50–1.35)
GFR calc Af Amer: 56 mL/min — ABNORMAL LOW (ref >90)

## 2011-05-10 LAB — CBC
MCH: 30.4 pg (ref 26.0–34.0)
MCV: 88.8 fL (ref 78.0–100.0)
Platelets: 52 10*3/uL — ABNORMAL LOW (ref 150–400)
RDW: 15.4 % (ref 11.5–15.5)

## 2011-05-10 LAB — GLUCOSE, CAPILLARY

## 2011-05-10 MED ORDER — SODIUM CHLORIDE 0.9 % IJ SOLN
INTRAMUSCULAR | Status: AC
  Administered 2011-05-10: 30 mL
  Filled 2011-05-10: qty 30

## 2011-05-10 NOTE — Progress Notes (Addendum)
[facility] [place]  Progress Note Bypass Surgery  Date of Surgery: 05/05/2011  Procedure(s): BYPASS GRAFT AXILLA-BIFEMORAL ENDARTERECTOMY FEMORAL BYPASS GRAFT FEMORAL-POPLITEAL ARTERY ENDARTERECTOMY POPLITEAL Surgeon: Surgeon(s): Sherren Kerns, MD Larina Earthly, MD  5 Days Post-Op  History of Present Illness  [name]. is a 78 y.o. male who is S/P Procedure(s): BYPASS GRAFT AXILLA-BIFEMORAL ENDARTERECTOMY FEMORAL BYPASS GRAFT FEMORAL-POPLITEAL ARTERY ENDARTERECTOMY POPLITEAL right.  The patient's pre-op symptoms  are Improved . Patients pain is well controlled.    VASC. LAB Studies:        [id number] a moderate reduction in arterial flow on the right and a severe reduction in arterial flow on the left post operative.      Imaging: No results found.  Significant Diagnostic Studies: CBC Lab Results  Component Value Date   WBC 6.1 05/10/2011   HGB 10.0* 05/10/2011   HCT 29.2* 05/10/2011   MCV 88.8 05/10/2011   PLT 52* 05/10/2011    BMET @LASTCHEMISTRY @     Component Value Date/Time   NA 138 05/10/2011 0420   K 3.9 05/10/2011 0420   CL 103 05/10/2011 0420   CO2 28 05/10/2011 0420   GLUCOSE 129* 05/10/2011 0420   BUN 26* 05/10/2011 0420   CREATININE 1.31 05/10/2011 0420   CALCIUM 8.0* 05/10/2011 0420   GFRNONAA 48* 05/10/2011 0420   GFRAA 56* 05/10/2011 0420    COAG Lab Results  Component Value Date   INR 1.22 05/02/2011   No results found for this basename: PTT    Physical Examination  BP Readings from Last 3 Encounters:  05/10/11 130/58  05/10/11 130/58  05/02/11 100/60   Temp Readings from Last 3 Encounters:  05/10/11 99.3 F (37.4 C) Oral  05/10/11 99.3 F (37.4 C) Oral  05/02/11 97 F (36.1 C)    SpO2 Readings from Last 3 Encounters:  05/10/11 95%  05/10/11 95%  05/02/11 95%   Pulse Readings from Last 3 Encounters:  05/10/11 88  05/10/11 88  05/02/11 85    Pt is A&O x 3 right lower extremity:  Incision/s is/are clean, dry, intact or  and  healing without hematoma, erythema or drainage Limb is warm; with good color Right hematoma at clavical incision no active bleeding.  Right Dorsalis Pedis pulse is present and palpable    LeftPosterior tibial pulse is  present and palpable   Assessment: Pt. Doing well Post-op pain is controlled Wounds are clean, dry, intact or healing well H/H has improved  Thrombocytopenia - stable  Plan: PT/OT for ambulation Continue wound care as ordered  Mosetta Pigeon [phone] 05/10/2011 9:05 AM        I have examined the patient, reviewed and agree with above. Grafts patent.  Mobilize Mark Whidby, MD 05/10/2011 9:58 AM

## 2011-05-10 NOTE — Progress Notes (Signed)
Subjective:  Doing well with no c/o chest pain or SOB.  Having some pain in leg still.  Objective:  Vital Signs in the last 24 hours: BP 130/58  Pulse 88  Temp(Src) 99.3 F (37.4 C) (Oral)  Resp 22  Ht 5\' 4"  (1.626 m)  Wt 72.9 kg (160 lb 11.5 oz)  BMI 27.59 kg/m2  SpO2 95%  Physical Exam:  Lungs:  Clear to A&P Cardiac:  Regular rhythm, normal S1 and S2, no S3 Abdomen:  Soft, nontender, no masses Extremities:  No edema present  Intake/Output from previous day: 03/15 0701 - 03/16 0700 In: 1237.5 [P.O.:600; I.V.:250; Blood:387.5] Out: 1950 [Urine:1950]  Lab Results: Basic Metabolic Panel:  Basename 05/10/11 0420 05/09/11 0420  NA 138 138  K 3.9 3.5  CL 103 104  CO2 28 29  GLUCOSE 129* 139*  BUN 26* 28*  CREATININE 1.31 1.39*    CBC:  Basename 05/10/11 0420 05/09/11 2100 05/09/11 0420  WBC 6.1 -- 6.9  NEUTROABS -- -- --  HGB 10.0* 10.3* --  HCT 29.2* 29.7* --  MCV 88.8 -- 89.1  PLT 52* -- 46*   Telemetry: NSR.  No significant arrythmias  Assessment/Plan:  1. Stable postop from cardiac viewpoint 2. NonSTEMI demand ischemia 3.  Anemia improved  Rec:  Continue postop course and may ambulate and mobilize from CV viewpoint  W. Viann Fish, Montez Hageman.  MD [facility] 05/10/2011, 9:21 AM

## 2011-05-11 LAB — GLUCOSE, CAPILLARY
Glucose-Capillary: 139 mg/dL — ABNORMAL HIGH (ref 70–99)
Glucose-Capillary: 118 mg/dL — ABNORMAL HIGH (ref 70–99)
Glucose-Capillary: 128 mg/dL — ABNORMAL HIGH (ref 70–99)

## 2011-05-11 MED ORDER — SODIUM CHLORIDE 0.9 % IJ SOLN
INTRAMUSCULAR | Status: AC
  Administered 2011-05-11: 10 mL
  Filled 2011-05-11: qty 30

## 2011-05-11 NOTE — Progress Notes (Addendum)
[facility] [place]  Progress Note Bypass Surgery  Date of Surgery: 05/05/2011  Procedure(s): BYPASS GRAFT AXILLA-BIFEMORAL ENDARTERECTOMY FEMORAL BYPASS GRAFT FEMORAL-POPLITEAL ARTERY ENDARTERECTOMY POPLITEAL Surgeon: Surgeon(s): Sherren Kerns, MD Larina Earthly, MD  6 Days Post-Op  History of Present Illness  [name]. is a 78 y.o. male who is S/P Procedure(s): BYPASS GRAFT AXILLA-BIFEMORAL ENDARTERECTOMY FEMORAL BYPASS GRAFT FEMORAL-POPLITEAL ARTERY ENDARTERECTOMY POPLITEAL right.  The patient's pre-op symptoms of  are Improved . Patients pain is well controlled.    VASC. LAB Studies:     [id number] a moderate reduction in arterial flow on the right and a severe reduction in arterial flow on the left post operative.      Imaging: No results found.  Significant Diagnostic Studies: CBC Lab Results  Component Value Date   WBC 6.1 05/10/2011   HGB 10.0* 05/10/2011   HCT 29.2* 05/10/2011   MCV 88.8 05/10/2011   PLT 52* 05/10/2011    BMET @LASTCHEMISTRY @     Component Value Date/Time   NA 138 05/10/2011 0420   K 3.9 05/10/2011 0420   CL 103 05/10/2011 0420   CO2 28 05/10/2011 0420   GLUCOSE 129* 05/10/2011 0420   BUN 26* 05/10/2011 0420   CREATININE 1.31 05/10/2011 0420   CALCIUM 8.0* 05/10/2011 0420   GFRNONAA 48* 05/10/2011 0420   GFRAA 56* 05/10/2011 0420    COAG Lab Results  Component Value Date   INR 1.22 05/02/2011   No results found for this basename: PTT    Physical Examination  BP Readings from Last 3 Encounters:  05/11/11 129/50  05/11/11 129/50  05/02/11 100/60   Temp Readings from Last 3 Encounters:  05/11/11 98.6 F (37 C) Oral  05/11/11 98.6 F (37 C) Oral  05/02/11 97 F (36.1 C)    SpO2 Readings from Last 3 Encounters:  05/11/11 96%  05/11/11 96%  05/02/11 95%   Pulse Readings from Last 3 Encounters:  05/11/11 97  05/11/11 97  05/02/11 85    Pt is A&O x 3 right lower extremity: Incision/s  is/are clean, dry, intact or clean,dry.intact, and  healing without hematoma, erythema or drainage Limb is warm; with good color Right iliac and lateral torso ecchymosis with decreased tenderness to palp.  Right Dorsalis Pedis pulse is monophasic by Doppler RightPosterior tibial pulse is  monophasic by Doppler  Left Dorsalis Pedis pulse is absent  LeftPosterior tibial pulse is  monophasic by Doppler   Assessment: Pt. Doing well Post-op pain is controlled Wounds are clean, dry, intact or healing well   Plan: PT/OT for ambulation Transfer to 2000 Plan SNF for continued care and rehabilitation with PT/OT Continue condom cath due to incontinence.  Clinton Gallant [facility] [phone] 05/11/2011 7:44 AM        I have examined the patient, reviewed and agree with above. Cont to mobilize with PT Mitesh Rosendahl, MD 05/11/2011 9:36 AM

## 2011-05-11 NOTE — Progress Notes (Signed)
Subjective:  Doing well with no c/o chest pain or SOB. C/o rapid heartbeat and that can hear heartbeat at times.  Having some pain in leg still.  Objective:  Vital Signs in the last 24 hours: BP 129/50  Pulse 97  Temp(Src) 98.6 F (37 C) (Oral)  Resp 22  Ht 5\' 4"  (1.626 m)  Wt 72.9 kg (160 lb 11.5 oz)  BMI 27.59 kg/m2  SpO2 96%  Physical Exam:  Lungs:  Clear to A&P Cardiac:  Regular rhythm, normal S1 and S2, no S3 2/6 systolic murur Abdomen:  Soft, nontender, no masses Extremities:  No edema present  Intake/Output from previous day: 03/16 0701 - 03/17 0700 In: 390 [P.O.:360; I.V.:30] Out: 1325 [Urine:1325]  Lab Results: Basic Metabolic Panel:  Basename 05/10/11 0420 05/09/11 0420  NA 138 138  K 3.9 3.5  CL 103 104  CO2 28 29  GLUCOSE 129* 139*  BUN 26* 28*  CREATININE 1.31 1.39*    CBC:  Basename 05/10/11 0420 05/09/11 2100 05/09/11 0420  WBC 6.1 -- 6.9  NEUTROABS -- -- --  HGB 10.0* 10.3* --  HCT 29.2* 29.7* --  MCV 88.8 -- 89.1  PLT 52* -- 46*   Telemetry: NSR. Occasional PVC's and couplets  Assessment/Plan:  1. Stable postop from cardiac viewpoint 2. NonSTEMI demand ischemia 3.  Anemia improved  Rec:  Continue postop course.  OK to go to floor.  Watch rhythm.  Darden Palmer.  MD [facility] 05/11/2011, 10:05 AM

## 2011-05-12 ENCOUNTER — Encounter (HOSPITAL_COMMUNITY): Payer: Self-pay | Admitting: Vascular Surgery

## 2011-05-12 LAB — GLUCOSE, CAPILLARY
Glucose-Capillary: 116 mg/dL — ABNORMAL HIGH (ref 70–99)
Glucose-Capillary: 113 mg/dL — ABNORMAL HIGH (ref 70–99)
Glucose-Capillary: 89 mg/dL (ref 70–99)
Glucose-Capillary: 107 mg/dL — ABNORMAL HIGH (ref 70–99)

## 2011-05-12 NOTE — Progress Notes (Addendum)
PT eval on 3/14. Recommend OT eval and PT follow up this week to determine if  Patient needs brief CIR stay vs SNF rehab. Please call with any questions. Pager [phone] I await OT eval today to see if he will qualify for a brief CIR stay.

## 2011-05-12 NOTE — Progress Notes (Signed)
Physical Therapy Treatment Patient Details Name: [name]. MRN: [id number] DOB: [birth year] Today's Date: 05/12/2011  PT Assessment/Plan  PT - Assessment/Plan Comments on Treatment Session: Progressing well today, ambulating 50 ft x2 with seated rest break. Continues to demonstrate balance deficits during gait needing minA for ambulation. Pt did not use RW prior to surgery and now cannot ambulate without it.  PT Plan: Discharge plan remains appropriate;Frequency remains appropriate Recommendations for Other Services: OT consult Follow Up Recommendations: Inpatient Rehab Equipment Recommended: Defer to next venue PT Goals  Acute Rehab PT Goals PT Goal: Sit to Stand - Progress: Progressing toward goal PT Goal: Stand to Sit - Progress: Progressing toward goal PT Transfer Goal: Bed to Chair/Chair to Bed - Progress: Progressing toward goal PT Goal: Stand - Progress: Progressing toward goal PT Goal: Ambulate - Progress: Progressing toward goal PT Goal: Perform Home Exercise Program - Progress: Progressing toward goal  PT Treatment Precautions/Restrictions  Precautions Precautions: Fall Restrictions Weight Bearing Restrictions: No Mobility (including Balance) Bed Mobility Bed Mobility: No Transfers Sit to Stand: 4: Min assist Sit to Stand Details (indicate cue type and reason): cues for sequencing and anterior translation of trunk over BOS as well as safe hand placement; assist for follow through to stand (initally with posterior LOB needing min-modA to correct Stand to Sit: 4: Min assist Stand to Sit Details: assist to control descent to chair as well as safe hand placement Ambulation/Gait Ambulation/Gait Assistance: 4: Min assist Ambulation/Gait Assistance Details (indicate cue type and reason): cues for upright posture, increase BOS, weight shift right and safe technique with RW; has turned out foot postioning on right (likely because of pain, decreased ROM at ankle);  ambulation improved significantly with ambulation, at end of session pt close to mingaurdA for ambulation Ambulation Distance (Feet): 100 Feet (50 x2 with one seated rest break) Assistive device: Rolling walker Gait Pattern: Trunk flexed;Shuffle  Balance Balance Assessed: Yes Static Standing Balance Static Standing - Balance Support: No upper extremity supported Static Standing - Level of Assistance: 5: Stand by assistance Static Standing - Comment/# of Minutes: pt stood x40 seconds (limited by fatigue), pt with increased sway in standing Exercise  General Exercises - Lower Extremity Ankle Circles/Pumps: AROM;Both;20 reps;Seated Long Arc Quad: AROM;Both;10 reps;Seated Hip Flexion/Marching: AROM;Both;10 reps;Seated End of Session PT - End of Session Equipment Utilized During Treatment: Gait belt Activity Tolerance: Patient tolerated treatment well Patient left: in chair;with call bell in reach General Behavior During Session: [facility] for tasks performed Cognition: [facility] for tasks performed  [facility] [name] 05/12/2011, 9:30 AM

## 2011-05-12 NOTE — Progress Notes (Signed)
I met with patient and then contacted his wife by phone to discuss inpatient acute rehab admission for about 1 week of rehab. Wife prefers [facility] care for an estimated 2 to 3 week stay before d/c home. Patient in agreement. We will sign off for they prefer SNF rather than inpatient rehab admit. Please call with any questions. Pager [phone]

## 2011-05-12 NOTE — Progress Notes (Addendum)
[facility] [place]  Progress Note Bypass Surgery  Date of Surgery: 05/05/2011  Procedure(s): Right BYPASS GRAFT AXILLA-BIFEMORAL ENDARTERECTOMY FEMORAL BYPASS GRAFT FEMORAL-POPLITEAL ARTERY ENDARTERECTOMY POPLITEAL Surgeon: Surgeon(s): Sherren Kerns, MD Larina Earthly, MD  7 Days Post-Op  History of Present Illness  [name]. is a 78 y.o. male who is S/P Procedure(s): BYPASS GRAFT AXILLA-BIFEMORAL ENDARTERECTOMY FEMORAL BYPASS GRAFT FEMORAL-POPLITEAL ARTERY ENDARTERECTOMY POPLITEAL right.  The patient's pre-op symptoms of pain are Improved . Patients pain is well controlled.    VASC. LAB Studies:        ABI: Right 0.75;  Left 0.47;   Imaging: No results found.  Significant Diagnostic Studies: CBC Lab Results  Component Value Date   WBC 6.1 05/10/2011   HGB 10.0* 05/10/2011   HCT 29.2* 05/10/2011   MCV 88.8 05/10/2011   PLT 52* 05/10/2011    BMET @LASTCHEMISTRY @     Component Value Date/Time   NA 138 05/10/2011 0420   K 3.9 05/10/2011 0420   CL 103 05/10/2011 0420   CO2 28 05/10/2011 0420   GLUCOSE 129* 05/10/2011 0420   BUN 26* 05/10/2011 0420   CREATININE 1.31 05/10/2011 0420   CALCIUM 8.0* 05/10/2011 0420   GFRNONAA 48* 05/10/2011 0420   GFRAA 56* 05/10/2011 0420    COAG Lab Results  Component Value Date   INR 1.22 05/02/2011   No results found for this basename: PTT    Physical Examination  BP Readings from Last 3 Encounters:  05/12/11 143/56  05/12/11 143/56  05/02/11 100/60   Temp Readings from Last 3 Encounters:  05/12/11 100.5 F (38.1 C) Oral  05/12/11 100.5 F (38.1 C) Oral  05/02/11 97 F (36.1 C)    SpO2 Readings from Last 3 Encounters:  05/12/11 98%  05/12/11 98%  05/02/11 95%   Pulse Readings from Last 3 Encounters:  05/12/11 92  05/12/11 92  05/02/11 85    Pt is A&O x 3 right upper, right lower extremity: Incision/s is/are clean,dry.intact, and  healing without hematoma, erythema or  drainage Limbs are warm; with good color Right flank hematoma resolving  Assessment: Pt. Doing well Post-op pain is controlled Wounds are healing well with resolving hematoma in flank tunnel site Post-op anemia stable/thrombocytopenia stable  Plan: PT/OT for ambulation Assess home needs Appreciate CIR input Continue wound care as ordered  Marlowe Shores [phone] 05/12/2011 8:07 AM        I have examined the patient, reviewed and agree with above.  [place] Selke, MD 05/12/2011 12:21 PM

## 2011-05-12 NOTE — Progress Notes (Signed)
Utilization review completed. Vear Staton, RN, BSN. 05/11/11 

## 2011-05-12 NOTE — Evaluation (Signed)
Occupational Therapy Evaluation Patient Details Name: [name]. MRN: [id number] DOB: 07-28-1924 Today's Date: 05/12/2011  Problem List:  Patient Active Problem List  Diagnoses  . Peripheral vascular disease, unspecified    Past Medical History:  Past Medical History  Diagnosis Date  . Hyperlipidemia     takes Atorvastatin daily  . CAD (coronary artery disease)   . Gastritis   . BPH (benign prostatic hypertrophy)     Dr.Nesi is urologist  . Arthritis   . Claudication   . Hypertension     takes Ramipril and Metoprolol daily  . Heart murmur   . Peripheral vascular disease   . Asthma     as a young child  . Bruises easily     takes Pletal and ASA daily  . Colitis     hx of  . Hx of colonic polyps   . Urinary urgency     takes Flomax daily  . Diabetes mellitus     borderline diabetic  . Blind     right eye  . Insomnia     takes Ambien nightly  . Mental disorder     takes Aricept daily  . History of shingles 3-25yrs ago   Past Surgical History:  Past Surgical History  Procedure Date  . Tonsillectomy 1960  . Coronary artery bypass graft 1989/2001    5 vessels;1 valve   . Valve replacement 2001  . Cardiac catheterization     multiple-see epic  . Eye surgery     cataract left eye  . Hemorrhoid surgery 1960  . Coronary angioplasty     right leg  . Colonoscopy     OT Assessment/Plan/Recommendation OT Assessment H&P: This is an 78yo male with extensive cardiac history including CAD s/p CABG, dyslipidemia, AVR with pericardial tissue valve, hypertension and PVD who presented for axillary bifemoreal bypass.  He had a prolonged surgery and post op had profound hypotension requiring multiple pressors was kept on the ventilator overnight due to high transfusion volume of 4 unites, shock and length of surgery.  PCCM was consulted for assistance with respiratory failure and shock. He had a total of 3 hours with SBP in the 60-80 mmHg range before BP stabilized on  pressors.  Was found to have N-STEMI in the setting of hypotension. Cardiology following.   Clinical Impression Statement: Pt presents to OT with the following problem list and will benefit from skilled OT in the acute setting to maximize I with ADL and ADL mobility to Mod I- S level up on d/c . OT Recommendation/Assessment: Patient will need skilled OT in the acute care venue OT Problem List: Decreased activity tolerance;Impaired balance (sitting and/or standing);Decreased knowledge of use of DME or AE;Decreased knowledge of precautions;Cardiopulmonary status limiting activity;Pain;Increased edema OT Therapy Diagnosis : Generalized weakness;Acute pain OT Plan OT Frequency: Min 2X/week OT Treatment/Interventions: Self-care/ADL training;DME and/or AE instruction;Therapeutic activities;Patient/family education;Balance training OT Recommendation Recommendations for Other Services: Rehab consult Follow Up Recommendations: Inpatient Rehab Equipment Recommended: Defer to next venue Individuals Consulted Consulted and Agree with Results and Recommendations: Patient OT Goals Acute Rehab OT Goals OT Goal Formulation: With patient Time For Goal Achievement: 2 weeks ADL Goals Pt Will Perform Grooming: Independently;Standing at sink;with modified independence ADL Goal: Grooming - Progress: Goal set today Pt Will Perform Upper Body Bathing: Independently;with modified independence;Sit to stand in shower;Sitting in shower ADL Goal: Upper Body Bathing - Progress: Goal set today Pt Will Perform Lower Body Bathing: with supervision;Sit to stand in shower  ADL Goal: Lower Body Bathing - Progress: Goal set today Pt Will Perform Upper Body Dressing: Independently;Sitting, chair;Sitting, bed ADL Goal: Upper Body Dressing - Progress: Goal set today Pt Will Perform Lower Body Dressing: with modified independence;Independently;with adaptive equipment;Sit to stand from chair;Sit to stand from bed ADL Goal: Lower  Body Dressing - Progress: Goal set today Pt Will Transfer to Toilet: with modified independence;Ambulation;3-in-1;with DME ADL Goal: Toilet Transfer - Progress: Goal set today Pt Will Perform Toileting - Hygiene: Independently;Standing at 3-in-1/toilet ADL Goal: Toileting - Hygiene - Progress: Goal set today Pt Will Perform Tub/Shower Transfer: Tub transfer;with DME;with supervision ADL Goal: Tub/Shower Transfer - Progress: Goal set today  OT Evaluation Precautions/Restrictions  Precautions Precautions: Fall Restrictions Weight Bearing Restrictions: No Prior Functioning Home Living Lives With: Spouse Receives Help From: Family Type of Home: House Home Layout: One level Home Access: Stairs to enter [employer] of Steps: 1 in the front with no rail; 2 in the back (? rail?) Bathroom Shower/Tub: Engineer, manufacturing systems: Standard Home Adaptive Equipment: Straight cane Prior Function Level of Independence: Independent with homemaking with ambulation;Independent with basic ADLs;Independent with transfers;Independent with gait Driving: Yes Vocation: Retired ADL ADL Eating/Feeding: Simulated;Independent Where Assessed - Eating/Feeding: Chair Grooming: Performed;Supervision/safety Where Assessed - Grooming: Standing at sink Upper Body Bathing: Simulated;Supervision/safety Where Assessed - Upper Body Bathing: Sitting, chair Lower Body Bathing: Simulated;Minimal assistance Where Assessed - Lower Body Bathing: Sit to stand from chair Upper Body Dressing: Simulated;Supervision/safety;Set up Where Assessed - Upper Body Dressing: Sitting, chair Lower Body Dressing: Simulated;Moderate assistance Where Assessed - Lower Body Dressing: Sit to stand from chair Toilet Transfer: Performed;Minimal assistance Toilet Transfer Method: Ambulating Toilet Transfer Equipment: Regular height toilet;Grab bars Toileting - Clothing Manipulation: Not assessed Toileting - Hygiene: Not  assessed Tub/Shower Transfer: Not assessed Equipment Used: Rolling walker Ambulation Related to ADLs: Min A with RW ambulation- assist needed to turn RW especially in narrow spaces. Pt also noted to have a limp? and out-turned Rt foot.  Cognition Cognition Overall Cognitive Status: Appears within functional limits for tasks assessed Sensation/Coordination Sensation Light Touch: Appears Intact Coordination Gross Motor Movements are Fluid and Coordinated: Not tested Fine Motor Movements are Fluid and Coordinated: Not tested Extremity Assessment RUE Assessment RUE Assessment: Within Functional Limits LUE Assessment LUE Assessment: Within Functional Limits Mobility  Bed Mobility Bed Mobility: No Transfers Sit to Stand: 4: Min assist Sit to Stand Details (indicate cue type and reason): cues for sequencing and anterior translation of trunk over BOS as well as safe hand placement; assist for follow through to stand (initally with posterior LOB needing min-modA to correct Stand to Sit: 4: Min assist Stand to Sit Details: assist to control descent to chair as well as safe hand placement  End of Session OT - End of Session Equipment Utilized During Treatment: Gait belt Activity Tolerance: Patient tolerated treatment well Patient left: in chair;with call bell in reach General Behavior During Session: [facility] for tasks performed Cognition: [facility] for tasks performed   Thamar Holik 05/12/2011, 11:35 AM

## 2011-05-12 NOTE — Progress Notes (Signed)
Clinical Social Work Department BRIEF PSYCHOSOCIAL ASSESSMENT 05/12/2011  Patient:  [name], [name]     Account Number:  [id number]     Admit date:  05/05/2011  Clinical Social Worker:  Peggyann Shoals  Date/Time:  05/12/2011 02:30 PM  Referred by:  Physician  Date Referred:  05/11/2011 Referred for  SNF Placement   Other Referral:   Interview type:  Patient Other interview type:    PSYCHOSOCIAL DATA Living Status:  WIFE Admitted from facility:   Level of care:   Primary support name:  [name] Primary support relationship to patient:  SPOUSE Degree of support available:   Supportive.    CURRENT CONCERNS Current Concerns  Post-Acute Placement   Other Concerns:    SOCIAL WORK ASSESSMENT / PLAN CIR contacted this CSW re: SNF placement. Pt's wife declined CIR in favor of SNF.    CSW met with pt to address referral. CSW introduced herself and explained role of social work. CSW shared what pt's wife reported to CIR, which pt's wife feels that pt would be benefit of 2-3 weeks of rehab at [facility] Pt's wife preference is [facility]. Pt reported that he would like to go to [facility] as it is close to his home. Pt is agreeable to SNF. CSW explained process of SNF.   Assessment/plan status:  Other - See comment Other assessment/ plan:   Discharge Planning to SNF:  CSW will complete FL2 and send request to [facility].  CSW will contact [facility] as it is pt's first choice.  CSW will follow up with bed offers.   Information/referral to community resources:   As needed.    PATIENT'S/FAMILY'S RESPONSE TO PLAN OF CARE: Pt is alert and oriented. Pt was very pleasant. Pt is agreeable to discharge plan. Pt is also agreeable to this CSW contacting his wife regarding discharge plan.

## 2011-05-12 NOTE — Progress Notes (Signed)
Patient Name: Kamali Nephew. Date of Encounter: 05/12/2011    SUBJECTIVE: He has no cardiac complaints. He wants me to be sure his prescriptions to [facility] pharmacy has been refilled. He walked in the hall today for the first time. He is looking for to go into a rehabilitation center upon discharge to build his strength.  TELEMETRY:  Normal sinus rhythm with PACs the: Filed Vitals:   05/12/11 0450 05/12/11 0920 05/12/11 0938 05/12/11 1338  BP: 143/56  136/55 100/50  Pulse: 92  87 86  Temp: 100.5 F (38.1 C)  99.1 F (37.3 C) 98.1 F (36.7 C)  TempSrc: Oral  Oral Oral  Resp: 18  18 18   Height:      Weight:      SpO2: 98% 98% 97% 99%    Intake/Output Summary (Last 24 hours) at 05/12/11 1811 Last data filed at 05/12/11 1428  Gross per 24 hour  Intake    600 ml  Output   1475 ml  Net   -875 ml    LABS: Basic Metabolic Panel:  Basename 05/10/11 0420  NA 138  K 3.9  CL 103  CO2 28  GLUCOSE 129*  BUN 26*  CREATININE 1.31  CALCIUM 8.0*  MG --  PHOS --   CBC:  Basename 05/10/11 0420 05/09/11 2100  WBC 6.1 --  NEUTROABS -- --  HGB 10.0* 10.3*  HCT 29.2* 29.7*  MCV 88.8 --  PLT 52* --   Radiology/Studies:  No new studies.  Physical Exam: Blood pressure 100/50, pulse 86, temperature 98.1 F (36.7 C), temperature source Oral, resp. rate 18, height 5\' 4"  (1.626 m), weight 65.409 kg (144 lb 3.2 oz), SpO2 99.00%. Weight change:    Right lower extremity greater than left lower extremity edema. Both lower extremities are warm.  Lungs are clear posteriorly.  Cardiac exam is unremarkable.  ASSESSMENT:  1. Stable cardiac status post non-ST elevation MI, type II.  2. PVD status post Axillary femoral bypass. Bypass.  3. Hypertension under control.   Plan:  1. I will make sure that Colostizol and metoprolol prescriptions are refilled at the [facility] mail order pharmacy.  Selinda Eon 05/12/2011, 6:11 PM

## 2011-05-13 LAB — POCT I-STAT 7, (LYTES, BLD GAS, ICA,H+H)
Acid-base deficit: 5 mmol/L — ABNORMAL HIGH (ref 0.0–2.0)
Bicarbonate: 20.2 mEq/L (ref 20.0–24.0)
Potassium: 4.3 mEq/L (ref 3.5–5.1)
Sodium: 140 mEq/L (ref 135–145)
TCO2: 21 mmol/L (ref 0–100)
pH, Arterial: 7.38 (ref 7.350–7.400)

## 2011-05-13 LAB — GLUCOSE, CAPILLARY: Glucose-Capillary: 98 mg/dL (ref 70–99)

## 2011-05-13 MED ORDER — OXYCODONE-ACETAMINOPHEN 5-325 MG PO TABS: 1.0000 | ORAL_TABLET | ORAL | Status: AC | PRN

## 2011-05-13 MED ORDER — DSS 100 MG PO CAPS: 100.0000 mg | ORAL_CAPSULE | Freq: Every day | ORAL | Status: AC

## 2011-05-13 MED ORDER — ACETAMINOPHEN 325 MG PO TABS: 325.0000 mg | ORAL_TABLET | ORAL | Status: DC | PRN

## 2011-05-13 NOTE — Progress Notes (Signed)
Right IJ d/c. Port intact. Site WNL. Pressure applied for 5 minutes. Pressure dressing applied. Will continue to monitor. [name]

## 2011-05-13 NOTE — Progress Notes (Signed)
Pt discharge instructions and prescriptions in packet to be sent with pt to SNF. R IJ d/c. Site WNL. No s/s of distress. Pt and wife had no further questions. Pt awaiting ambulance for d/c. [name]

## 2011-05-13 NOTE — Progress Notes (Signed)
Pt is ready for discharge to [facility] today. Pt and pt's wife are agreeable to discharge plan. Facility has received discharge summary and is able to accept pt. [facility] will provide transportation to facility. CSW signing off as no further clinical social work needs identified.   Dede Query, MSW, Theresia Majors [phone]

## 2011-05-13 NOTE — Discharge Summary (Signed)
[facility] Discharge Summary   Patient ID:  [name]. MRN: [id number] DOB/AGE: May 28, 1924 78 y.o.  Admit date: 05/05/2011 Discharge date: 05/13/2011 Date of Surgery: 05/05/2011 Surgeon: Surgeon(s): Sherren Kerns, MD Larina Earthly, MD  Admission Diagnosis: CLAUDICATION  Discharge Diagnoses:  CLAUDICATION  Secondary Diagnoses: Past Medical History  Diagnosis Date  . Hyperlipidemia     takes Atorvastatin daily  . CAD (coronary artery disease)   . Gastritis   . BPH (benign prostatic hypertrophy)     Dr.Nesi is urologist  . Arthritis   . Claudication   . Hypertension     takes Ramipril and Metoprolol daily  . Heart murmur   . Peripheral vascular disease   . Asthma     as a young child  . Bruises easily     takes Pletal and ASA daily  . Colitis     hx of  . Hx of colonic polyps   . Urinary urgency     takes Flomax daily  . Diabetes mellitus     borderline diabetic  . Blind     right eye  . Insomnia     takes Ambien nightly  . Mental disorder     takes Aricept daily  . History of shingles 3-15yrs ago    Procedure(s): BYPASS GRAFT AXILLA-BIFEMORAL ENDARTERECTOMY FEMORAL BYPASS GRAFT FEMORAL-POPLITEAL ARTERY ENDARTERECTOMY POPLITEAL  Discharged Condition: good  HPI: [name]. is a 78 y.o. male  with known progressive arterial occlusive disease of his lower extremities. He had a CT angiogram with runoff. Of note he did have a slightly elevated creatinine prior to the exam we were able to perform the exam however. He currently has symptoms of rest pain in his feet. This is become slowly progressively worse. He does not have any ulcerations or open wounds on his feet.  CT angiogram showed.  Right lower extremity: His right common iliac stent has had some progressive stenosis. His right common femoral artery is occluded. His right superficial femoral and popliteal artery is severely diseased and subtotally occluded. His right  below-knee popliteal artery is patent. He has posterior tibial and peroneal runoff to the right foot.  Left lower extremity: His left common iliac artery has an 80% stenosis. His left external iliac artery is a 60-70% stenosis. His left internal iliac artery is occluded. His left common femoral artery is occluded. His left superficial femoral artery is occluded. His left popliteal artery is occluded. He has posterior tibial and peroneal runoff to the left foot.   Due to the patient's age and the calcification of his abdominal aorta he is not a candidate for aortobifemoral bypass graft. The next option would be to do a right axillary bifemoral bypass with consideration to adding a lower extremity bypass to this at the same time  Pt was admitted for right axillofemoral bypass as aortobifemoral bypass with poss right fem-pop bypass  Hospital Course:  [name]. is a 78 y.o. male is S/P  Procedure(s): right BYPASS GRAFT AXILLA-BIFEMORAL ENDARTERECTOMY FEMORAL BYPASS GRAFT FEMORAL-POPLITEAL ARTERY ENDARTERECTOMY POPLITEAL Extubated: POD # 2 Post-op wounds healing well Pt. Ambulating, voiding and taking PO diet without difficulty. Pt pain controlled with PO pain meds. Labs as below Complications:pt had hypotension post-operatively and a NSTEMI Pt required multiple transfusions. He had incisional and right flank hematomas sec to thrombocytopenia which is stable  He is doing very well with labs and cardiac status stable. He is beginning to ambulate but  needs further rehab to regain strength  Consults:  Treatment Team:  Lesleigh Noe, MD  Significant Diagnostic Studies: CBC Lab Results  Component Value Date   WBC 6.1 05/10/2011   HGB 10.0* 05/10/2011   HCT 29.2* 05/10/2011   MCV 88.8 05/10/2011   PLT 52* 05/10/2011    BMET    Component Value Date/Time   NA 138 05/10/2011 0420   K 3.9 05/10/2011 0420   CL 103 05/10/2011 0420   CO2 28 05/10/2011 0420   GLUCOSE 129* 05/10/2011  0420   BUN 26* 05/10/2011 0420   CREATININE 1.31 05/10/2011 0420   CALCIUM 8.0* 05/10/2011 0420   GFRNONAA 48* 05/10/2011 0420   GFRAA 56* 05/10/2011 0420   COAG Lab Results  Component Value Date   INR 1.22 05/02/2011     Disposition:  Discharge to :Skilled nursing facility Discharge Orders    Future Orders Please Complete By Expires   Resume previous diet      Driving Restrictions      Comments:   No driving   Lifting restrictions      Comments:   No lifting of right arm above shoulder height   Call MD for:  temperature >100.5      Call MD for:  redness, tenderness, or signs of infection (pain, swelling, bleeding, redness, odor or green/yellow discharge around incision site)      Call MD for:  severe or increased pain, loss or decreased feeling  in affected limb(s)      Increase activity slowly      Comments:   Walk with assistance use walker or cane as needed   Walk with assistance      May shower       No dressing needed         Shoaib, [place].  Home Medication Instructions [id number]   Printed on:05/13/11 1405  Medication Information                    ezetimibe (ZETIA) 10 MG tablet Take 10 mg by mouth at bedtime.             cilostazol (PLETAL) 100 MG tablet Take 100 mg by mouth 2 (two) times daily.            metoprolol succinate (TOPROL-XL) 25 MG 24 hr tablet Take 37.5 mg by mouth 2 (two) times daily.            nitroGLYCERIN (NITROSTAT) 0.4 MG SL tablet Place 0.4 mg under the tongue every 5 (five) minutes as needed. For chest pain           Tamsulosin HCl (FLOMAX) 0.4 MG CAPS Take 0.4 mg by mouth at bedtime.            fesoterodine (TOVIAZ) 4 MG TB24 Take 4 mg by mouth daily.             zolpidem (AMBIEN) 10 MG tablet Take 10 mg by mouth at bedtime as needed. For sleep           ramipril (ALTACE) 5 MG tablet Take 5 mg by mouth daily.             atorvastatin (LIPITOR) 40 MG tablet Take 40 mg by mouth daily.           donepezil (ARICEPT) 10  MG tablet Take 10 mg by mouth daily.           aspirin EC 325 MG tablet Take  325 mg by mouth daily.           acetaminophen (TYLENOL) 325 MG tablet Take 1-2 tablets (325-650 mg total) by mouth every 4 (four) hours as needed (or temp >/= 101 F).           docusate sodium 100 MG CAPS Take 100 mg by mouth daily.           oxyCODONE-acetaminophen (ROXICET) 5-325 MG per tablet Take 1 tablet by mouth every 4 (four) hours as needed for pain.            Verbal and written Discharge instructions given to the patient. Wound care per Discharge AVS Follow-up Information    Follow up with Sherren Kerns, MD in 3 weeks. (office will arrange - sent)    Contact information:   [address] [place] [address] [phone]          Signed: Marlowe Shores 05/13/2011, 2:05 PM

## 2011-05-13 NOTE — Progress Notes (Signed)
Pt refused PM scheduled medication which includes his BP medicine, he was notified of his blood pressure level obtained as of then. He opted to take his PRN Ambien 5 mg. See progress note for Vital signs. Will continue to monitor.

## 2011-05-13 NOTE — Progress Notes (Signed)
Physical Therapy Treatment Patient Details Name: [name]. MRN: [id number] DOB: September 07, 1924 Today's Date: 05/13/2011  PT Assessment/Plan  PT - Assessment/Plan Comments on Treatment Session: Progressing well. Likely to SNF today.  PT Plan: Discharge plan remains appropriate;Frequency remains appropriate Follow Up Recommendations: Skilled nursing facility Equipment Recommended: Defer to next venue PT Goals  Acute Rehab PT Goals PT Goal: Sit to Stand - Progress: Progressing toward goal PT Goal: Stand to Sit - Progress: Progressing toward goal PT Transfer Goal: Bed to Chair/Chair to Bed - Progress: Progressing toward goal PT Goal: Stand - Progress: Progressing toward goal PT Goal: Ambulate - Progress: Progressing toward goal  PT Treatment Precautions/Restrictions  Precautions Precautions: Fall Restrictions Weight Bearing Restrictions: No Mobility (including Balance) Bed Mobility Bed Mobility: No Transfers Sit to Stand: 4: Min assist;With armrests;From chair/3-in-1;With upper extremity assist Sit to Stand Details (indicate cue type and reason): min facilitation today with cues for safe hand placement as well as follow through and upright posture in standing; slight LOB post. but only minA to correct today  Stand to Sit: 5: Supervision;To chair/3-in-1;With armrests Stand to Sit Details: cues for safe technique Ambulation/Gait Ambulation/Gait Assistance: 4: Min assist Ambulation/Gait Assistance Details (indicate cue type and reason): amb again with turned out right foot and decreased weight shift right as well as laterally flexed right; cues for more upright posture and weight shift right Ambulation Distance (Feet): 200 Feet Assistive device: Rolling walker Gait Pattern: Trunk flexed;Decreased weight shift to right;Lateral trunk lean to right;Decreased stance time - right;Decreased step length - right  Static Standing Balance Static Standing - Balance Support: No upper  extremity supported Static Standing - Level of Assistance: 5: Stand by assistance Static Standing - Comment/# of Minutes: pt stands x1 minute with no UE support but laterally flexed right with weight and pelvis shifted left Dynamic Standing Balance Dynamic Standing - Balance Support: No upper extremity supported Dynamic Standing - Level of Assistance: 4: Min assist Dynamic Standing - Balance Activities: Lateral lean/weight shifting Dynamic Standing - Comments: facilitation to bring pt to midline with alignment and then lateral weight shifting with cues for forward gaze and maintainence of midline alignment Exercise    End of Session PT - End of Session Equipment Utilized During Treatment: Gait belt Activity Tolerance: Patient tolerated treatment well Patient left: in chair General Behavior During Session: [facility] for tasks performed Cognition: [facility] for tasks performed  [facility] [name] 05/13/2011, 12:35 PM

## 2011-05-14 NOTE — Progress Notes (Signed)
Patient discharged to SNF.

## 2011-05-15 NOTE — Progress Notes (Signed)
LATE ENTRY 05/16/11 at 1730  Clinical Social Work Department CLINICAL SOCIAL WORK PLACEMENT NOTE 05/15/2011  Patient:  [name], [name]  Account Number:  [id number] Admit date:  05/05/2011  Clinical Social Worker:  Peggyann Shoals  Date/time:  05/13/2011 01:30 PM  Clinical Social Work is seeking post-discharge placement for this patient at the following level of care:   SKILLED NURSING   (*CSW will update this form in Epic as items are completed)   05/12/2011  Patient/family provided with [facility] Department of Clinical Social Work's list of facilities offering this level of care within the geographic area requested by the patient (or if unable, by the patient's family).  05/12/2011  Patient/family informed of their freedom to choose among providers that offer the needed level of care, that participate in Medicare, Medicaid or managed care program needed by the patient, have an available bed and are willing to accept the patient.  05/12/2011  Patient/family informed of [facility]' ownership interest in [facility], as well as of the fact that they are under no obligation to receive care at this facility.  PASARR submitted to EDS on 05/12/2011 PASARR number received from EDS on 05/13/2011  FL2 transmitted to all facilities in geographic area requested by pt/family on  05/12/2011 FL2 transmitted to all facilities within larger geographic area on   Patient informed that his/her managed care company has contracts with or will negotiate with  certain facilities, including the following:     Patient/family informed of bed offers received:  05/13/2011 Patient chooses bed at [facility] Physician recommends and patient chooses bed at  [facility]  Patient to be transferred to [facility] on  05/13/2011 Patient to be transferred to facility by [facility]  The following physician request were entered in Epic:   Additional  Comments:

## 2011-05-31 ENCOUNTER — Emergency Department (HOSPITAL_COMMUNITY)
Admission: EM | Admit: 2011-05-31 | Discharge: 2011-05-31 | Disposition: A | Discharge status: Home / Self Care | Payer: Medicare Other | Attending: Emergency Medicine | Admitting: Emergency Medicine

## 2011-05-31 ENCOUNTER — Encounter (HOSPITAL_COMMUNITY): Payer: Self-pay | Admitting: Emergency Medicine

## 2011-05-31 DIAGNOSIS — L989 Disorder of the skin and subcutaneous tissue, unspecified: Secondary | ICD-10-CM | POA: Insufficient documentation

## 2011-05-31 DIAGNOSIS — T82898A Other specified complication of vascular prosthetic devices, implants and grafts, initial encounter: Secondary | ICD-10-CM | POA: Insufficient documentation

## 2011-05-31 DIAGNOSIS — M129 Arthropathy, unspecified: Secondary | ICD-10-CM | POA: Insufficient documentation

## 2011-05-31 DIAGNOSIS — E785 Hyperlipidemia, unspecified: Secondary | ICD-10-CM | POA: Insufficient documentation

## 2011-05-31 DIAGNOSIS — Z9889 Other specified postprocedural states: Secondary | ICD-10-CM | POA: Insufficient documentation

## 2011-05-31 DIAGNOSIS — I739 Peripheral vascular disease, unspecified: Secondary | ICD-10-CM | POA: Insufficient documentation

## 2011-05-31 DIAGNOSIS — I10 Essential (primary) hypertension: Secondary | ICD-10-CM | POA: Insufficient documentation

## 2011-05-31 DIAGNOSIS — Y849 Medical procedure, unspecified as the cause of abnormal reaction of the patient, or of later complication, without mention of misadventure at the time of the procedure: Secondary | ICD-10-CM | POA: Insufficient documentation

## 2011-05-31 DIAGNOSIS — Z7982 Long term (current) use of aspirin: Secondary | ICD-10-CM | POA: Insufficient documentation

## 2011-05-31 DIAGNOSIS — I2510 Atherosclerotic heart disease of native coronary artery without angina pectoris: Secondary | ICD-10-CM | POA: Insufficient documentation

## 2011-05-31 DIAGNOSIS — J45909 Unspecified asthma, uncomplicated: Secondary | ICD-10-CM | POA: Insufficient documentation

## 2011-05-31 DIAGNOSIS — IMO0002 Reserved for concepts with insufficient information to code with codable children: Secondary | ICD-10-CM

## 2011-05-31 DIAGNOSIS — E119 Type 2 diabetes mellitus without complications: Secondary | ICD-10-CM | POA: Insufficient documentation

## 2011-05-31 DIAGNOSIS — Z79899 Other long term (current) drug therapy: Secondary | ICD-10-CM | POA: Insufficient documentation

## 2011-05-31 MED ORDER — SULFAMETHOXAZOLE-TMP DS 800-160 MG PO TABS
1.0000 | ORAL_TABLET | Freq: Once | ORAL | Status: AC
  Administered 2011-05-31: 1 via ORAL
  Filled 2011-05-31: qty 1

## 2011-05-31 MED ORDER — SULFAMETHOXAZOLE-TRIMETHOPRIM 800-160 MG PO TABS: 1.0000 | ORAL_TABLET | Freq: Two times a day (BID) | ORAL | Status: DC

## 2011-05-31 NOTE — ED Notes (Signed)
Per EMS: Pt had some kind of surgery on his chest and noticed a sore in his surgery site.  Sore is oozing light green discharge.  No pain.

## 2011-05-31 NOTE — ED Provider Notes (Signed)
History     CSN: [id number]  Arrival date & time 05/31/11  1041   First MD Initiated Contact with Patient 05/31/11 1050      Chief Complaint  Patient presents with  . Sore    on chest    (Consider location/radiation/quality/duration/timing/severity/associated sxs/prior treatment) The history is provided by the patient and medical records.   the patient had a right axillary bifemoral graft placed on 05/05/2011.  He's been in a rehabilitation facility since and last night he began noticing drainage coming from his right axillary region.  As reported that there was a foul smell and maybe some thin greenish drainage however the patient has not noticed a foul smell.  He denies fevers and spreading redness.  He has no pain over this area.  He reports no pain in his legs.  He is otherwise without complaints.  This was evaluated by the nurse at the rehabilitation facility today and after discussions with the on-call covering physician it was decided to send the patient to the emergency department.  He is without complaints.  He reports the drainage has slowed down  Past Medical History  Diagnosis Date  . Hyperlipidemia     takes Atorvastatin daily  . CAD (coronary artery disease)   . Gastritis   . BPH (benign prostatic hypertrophy)     Dr.Nesi is urologist  . Arthritis   . Claudication   . Hypertension     takes Ramipril and Metoprolol daily  . Heart murmur   . Peripheral vascular disease   . Asthma     as a young child  . Bruises easily     takes Pletal and ASA daily  . Colitis     hx of  . Hx of colonic polyps   . Urinary urgency     takes Flomax daily  . Diabetes mellitus     borderline diabetic  . Blind     right eye  . Insomnia     takes Ambien nightly  . Mental disorder     takes Aricept daily  . History of shingles 3-41yrs ago    Past Surgical History  Procedure Date  . Tonsillectomy 1960  . Coronary artery bypass graft 1989/2001    5 vessels;1 valve   . Valve  replacement 2001  . Cardiac catheterization     multiple-see epic  . Eye surgery     cataract left eye  . Hemorrhoid surgery 1960  . Coronary angioplasty     right leg  . Colonoscopy   . Axillary-femoral bypass graft 05/05/2011    Procedure: BYPASS GRAFT AXILLA-BIFEMORAL;  Surgeon: Sherren Kerns, MD;  Location: [facility] OR;  Service: Vascular;  Laterality: Right;  . Femoral-popliteal bypass graft 05/05/2011    Procedure: BYPASS GRAFT FEMORAL-POPLITEAL ARTERY;  Surgeon: Sherren Kerns, MD;  Location: [facility] OR;  Service: Vascular;  Laterality: Right;    Family History  Problem Relation Age of Onset  . Heart disease Father   . Anesthesia problems Neg Hx   . Hypotension Neg Hx   . Malignant hyperthermia Neg Hx   . Pseudochol deficiency Neg Hx     History  Substance Use Topics  . Smoking status: Former Smoker    Quit date: 02/25/1936  . Smokeless tobacco: Not on file  . Alcohol Use: No      Review of Systems  All other systems reviewed and are negative.    Allergies  Shrimp  Home Medications   Current Outpatient Rx  Name Route Sig Dispense Refill  . ACETAMINOPHEN 325 MG PO TABS Oral Take 1-2 tablets (325-650 mg total) by mouth every 4 (four) hours as needed (or temp >/= 101 F). 20 tablet 0  . ASPIRIN EC 325 MG PO TBEC Oral Take 325 mg by mouth daily.    . ATORVASTATIN CALCIUM 40 MG PO TABS Oral Take 40 mg by mouth daily.    Marland Kitchen CILOSTAZOL 100 MG PO TABS Oral Take 100 mg by mouth 2 (two) times daily.     Marland Kitchen DOCUSATE SODIUM 100 MG PO CAPS Oral Take 100 mg by mouth daily.    . DONEPEZIL HCL 10 MG PO TABS Oral Take 10 mg by mouth daily.    Marland Kitchen EZETIMIBE 10 MG PO TABS Oral Take 10 mg by mouth at bedtime.      . FESOTERODINE FUMARATE ER 4 MG PO TB24 Oral Take 4 mg by mouth daily.      Marland Kitchen METOPROLOL SUCCINATE ER 25 MG PO TB24 Oral Take 37.5 mg by mouth 2 (two) times daily.     Marland Kitchen OVER THE COUNTER MEDICATION Oral Take 30 mLs by mouth 2 (two) times daily. Protein supplement    . RAMIPRIL  5 MG PO TABS Oral Take 5 mg by mouth daily.      Marland Kitchen TAMSULOSIN HCL 0.4 MG PO CAPS Oral Take 0.4 mg by mouth at bedtime.     Marland Kitchen VITAMIN C 500 MG PO TABS Oral Take 500 mg by mouth 2 (two) times daily.    Marland Kitchen ZOLPIDEM TARTRATE 10 MG PO TABS Oral Take 10 mg by mouth at bedtime as needed. For sleep    . NITROGLYCERIN 0.4 MG SL SUBL Sublingual Place 0.4 mg under the tongue every 5 (five) minutes as needed. For chest pain    . SULFAMETHOXAZOLE-TRIMETHOPRIM 800-160 MG PO TABS Oral Take 1 tablet by mouth every 12 (twelve) hours. 14 tablet 0    BP 136/68  Pulse 99  Temp 98.2 F (36.8 C)  Resp 20  SpO2 99%  Physical Exam  Nursing note and vitals reviewed. Constitutional: He is oriented to person, place, and time. He appears well-developed and well-nourished.  HENT:  Head: Normocephalic and atraumatic.  Eyes: EOM are normal.  Neck: Normal range of motion.  Cardiovascular: Normal rate, regular rhythm, normal heart sounds and intact distal pulses.   Pulmonary/Chest: Effort normal and breath sounds normal. No respiratory distress.       Right axillary region with a small pinhole draining serous fluid from the right axillary bifemoral incision site.  He has a normal pulse to his right axillary graft.  There is no surrounding warmth or erythema.  Abdominal: Soft. He exhibits no distension. There is no tenderness.  Musculoskeletal: Normal range of motion.  Neurological: He is alert and oriented to person, place, and time.  Skin: Skin is warm and dry.  Psychiatric: He has a normal mood and affect. Judgment normal.    ED Course  Procedures (including critical care time)   Labs Reviewed  WOUND CULTURE   No results found.   1. Seroma       MDM  This appears to be a draining seroma at this time.  He has normal pulses in his axillary graft and distally. he has no secondary signs of infection.  I discussed his case with Dr. Imogene Burn with vascular surgery who agrees with coverage with antibiotics and  the wound culture.  The patient is followup with his vascular surgeon in 5  days.  He'll followup at that time or sooner as needed.  The patient was given infection warnings.       Lyanne Co, MD 05/31/11 [phone]

## 2011-05-31 NOTE — ED Notes (Signed)
EAV:[id number]<JX> Expected date:05/31/11<BR> Expected time:10:30 AM<BR> Means of arrival:Ambulance<BR> Comments:<BR> EMS- Eval of wound- from nursing home

## 2011-06-02 LAB — WOUND CULTURE: Gram Stain: NONE SEEN

## 2011-06-03 ENCOUNTER — Encounter: Payer: Self-pay | Admitting: Vascular Surgery

## 2011-06-05 ENCOUNTER — Encounter (HOSPITAL_COMMUNITY): Payer: Self-pay | Admitting: General Practice

## 2011-06-05 ENCOUNTER — Ambulatory Visit (INDEPENDENT_AMBULATORY_CARE_PROVIDER_SITE_OTHER): Payer: Medicare Other | Admitting: Vascular Surgery

## 2011-06-05 ENCOUNTER — Inpatient Hospital Stay (HOSPITAL_COMMUNITY): Payer: Medicare Other

## 2011-06-05 ENCOUNTER — Inpatient Hospital Stay (HOSPITAL_COMMUNITY)
Admission: AD | Admit: 2011-06-05 | Discharge: 2011-06-13 | DRG: 253 | Disposition: A | Discharge status: Home Health | Payer: Medicare Other | Source: Ambulatory Visit | Attending: Vascular Surgery | Admitting: Vascular Surgery

## 2011-06-05 ENCOUNTER — Encounter: Payer: Self-pay | Admitting: Vascular Surgery

## 2011-06-05 VITALS — BP 133/48 | HR 88 | Temp 98.3°F | Resp 16 | Ht 64.0 in | Wt 130.0 lb

## 2011-06-05 DIAGNOSIS — I743 Embolism and thrombosis of arteries of the lower extremities: Secondary | ICD-10-CM | POA: Diagnosis present

## 2011-06-05 DIAGNOSIS — F0390 Unspecified dementia without behavioral disturbance: Secondary | ICD-10-CM | POA: Diagnosis present

## 2011-06-05 DIAGNOSIS — I739 Peripheral vascular disease, unspecified: Secondary | ICD-10-CM

## 2011-06-05 DIAGNOSIS — T827XXA Infection and inflammatory reaction due to other cardiac and vascular devices, implants and grafts, initial encounter: Principal | ICD-10-CM | POA: Diagnosis present

## 2011-06-05 DIAGNOSIS — E119 Type 2 diabetes mellitus without complications: Secondary | ICD-10-CM | POA: Diagnosis present

## 2011-06-05 DIAGNOSIS — Z79899 Other long term (current) drug therapy: Secondary | ICD-10-CM

## 2011-06-05 DIAGNOSIS — I2510 Atherosclerotic heart disease of native coronary artery without angina pectoris: Secondary | ICD-10-CM | POA: Diagnosis present

## 2011-06-05 DIAGNOSIS — E785 Hyperlipidemia, unspecified: Secondary | ICD-10-CM | POA: Diagnosis present

## 2011-06-05 DIAGNOSIS — H5440 Blindness, one eye, unspecified eye: Secondary | ICD-10-CM | POA: Diagnosis present

## 2011-06-05 DIAGNOSIS — N289 Disorder of kidney and ureter, unspecified: Secondary | ICD-10-CM | POA: Diagnosis present

## 2011-06-05 DIAGNOSIS — I10 Essential (primary) hypertension: Secondary | ICD-10-CM | POA: Diagnosis present

## 2011-06-05 DIAGNOSIS — G4700 Insomnia, unspecified: Secondary | ICD-10-CM | POA: Diagnosis present

## 2011-06-05 DIAGNOSIS — N400 Enlarged prostate without lower urinary tract symptoms: Secondary | ICD-10-CM | POA: Diagnosis present

## 2011-06-05 DIAGNOSIS — Y832 Surgical operation with anastomosis, bypass or graft as the cause of abnormal reaction of the patient, or of later complication, without mention of misadventure at the time of the procedure: Secondary | ICD-10-CM | POA: Diagnosis present

## 2011-06-05 HISTORY — DX: Blindness, one eye, unspecified eye: H54.40

## 2011-06-05 LAB — COMPREHENSIVE METABOLIC PANEL
Albumin: 3 g/dL — ABNORMAL LOW (ref 3.5–5.2)
CO2: 29 mEq/L (ref 19–32)
Chloride: 104 mEq/L (ref 96–112)
Creatinine, Ser: 1.62 mg/dL — ABNORMAL HIGH (ref 0.50–1.35)
Glucose, Bld: 95 mg/dL (ref 70–99)
Sodium: 140 mEq/L (ref 135–145)
Total Protein: 6.4 g/dL (ref 6.0–8.3)

## 2011-06-05 LAB — CBC
Hemoglobin: 11.7 g/dL — ABNORMAL LOW (ref 13.0–17.0)
MCH: 30.3 pg (ref 26.0–34.0)
Platelets: 87 10*3/uL — ABNORMAL LOW (ref 150–400)
RBC: 3.86 MIL/uL — ABNORMAL LOW (ref 4.22–5.81)
WBC: 5.1 10*3/uL (ref 4.0–10.5)

## 2011-06-05 LAB — PROTIME-INR
INR: 1.32 (ref 0.00–1.49)
Prothrombin Time: 16.6 seconds — ABNORMAL HIGH (ref 11.6–15.2)

## 2011-06-05 MED ORDER — ASPIRIN EC 325 MG PO TBEC: 325.0000 mg | DELAYED_RELEASE_TABLET | Freq: Every day | ORAL | Status: DC

## 2011-06-05 MED ORDER — ONDANSETRON HCL 4 MG/2ML IJ SOLN: 4.0000 mg | Freq: Four times a day (QID) | INTRAMUSCULAR | Status: DC | PRN

## 2011-06-05 MED ORDER — ACETAMINOPHEN 325 MG PO TABS: 325.0000 mg | ORAL_TABLET | ORAL | Status: DC | PRN

## 2011-06-05 MED ORDER — EZETIMIBE 10 MG PO TABS: 10.0000 mg | ORAL_TABLET | Freq: Every day | ORAL | Status: DC

## 2011-06-05 MED ORDER — NITROGLYCERIN 0.4 MG SL SUBL: 0.4000 mg | SUBLINGUAL_TABLET | SUBLINGUAL | Status: DC | PRN

## 2011-06-05 MED ORDER — PANTOPRAZOLE SODIUM 40 MG PO TBEC: 40.0000 mg | DELAYED_RELEASE_TABLET | Freq: Every day | ORAL | Status: DC

## 2011-06-05 MED ORDER — GUAIFENESIN-DM 100-10 MG/5ML PO SYRP: 15.0000 mL | ORAL_SOLUTION | ORAL | Status: DC | PRN

## 2011-06-05 MED ORDER — SODIUM CHLORIDE 0.9 % IV SOLN: INTRAVENOUS | Status: DC

## 2011-06-05 MED ORDER — METOPROLOL SUCCINATE ER 25 MG PO TB24: 37.5000 mg | ORAL_TABLET | Freq: Two times a day (BID) | ORAL | Status: DC

## 2011-06-05 MED ORDER — LABETALOL HCL 5 MG/ML IV SOLN
10.0000 mg | INTRAVENOUS | Status: DC | PRN
  Filled 2011-06-05: qty 4

## 2011-06-05 MED ORDER — VANCOMYCIN HCL IN DEXTROSE 1-5 GM/200ML-% IV SOLN
1000.0000 mg | INTRAVENOUS | Status: DC
  Administered 2011-06-05 – 2011-06-08 (×4): 1000 mg via INTRAVENOUS
  Filled 2011-06-05 (×4): qty 200

## 2011-06-05 MED ORDER — SODIUM CHLORIDE 0.9 % IV SOLN
INTRAVENOUS | Status: DC
  Administered 2011-06-05 – 2011-06-06 (×2): via INTRAVENOUS

## 2011-06-05 MED ORDER — CILOSTAZOL 100 MG PO TABS: 100.0000 mg | ORAL_TABLET | Freq: Two times a day (BID) | ORAL | Status: DC

## 2011-06-05 MED ORDER — ACETAMINOPHEN 325 MG PO TABS
325.0000 mg | ORAL_TABLET | ORAL | Status: DC | PRN
  Administered 2011-06-11: 650 mg via ORAL
  Filled 2011-06-05: qty 2

## 2011-06-05 MED ORDER — FESOTERODINE FUMARATE ER 4 MG PO TB24
4.0000 mg | ORAL_TABLET | Freq: Every day | ORAL | Status: DC
  Administered 2011-06-06 – 2011-06-13 (×7): 4 mg via ORAL
  Filled 2011-06-05 (×9): qty 1

## 2011-06-05 MED ORDER — HYDRALAZINE HCL 20 MG/ML IJ SOLN: 10.0000 mg | INTRAMUSCULAR | Status: DC | PRN

## 2011-06-05 MED ORDER — VANCOMYCIN HCL IN DEXTROSE 1-5 GM/200ML-% IV SOLN
1000.0000 mg | Freq: Two times a day (BID) | INTRAVENOUS | Status: DC
  Filled 2011-06-05 (×2): qty 200

## 2011-06-05 MED ORDER — PHENOL 1.4 % MT LIQD
1.0000 | OROMUCOSAL | Status: DC | PRN
  Filled 2011-06-05: qty 177

## 2011-06-05 MED ORDER — POTASSIUM CHLORIDE CRYS ER 20 MEQ PO TBCR: 20.0000 meq | EXTENDED_RELEASE_TABLET | Freq: Once | ORAL | Status: DC

## 2011-06-05 MED ORDER — DONEPEZIL HCL 10 MG PO TABS: 10.0000 mg | ORAL_TABLET | Freq: Every day | ORAL | Status: DC

## 2011-06-05 MED ORDER — ACETAMINOPHEN 650 MG RE SUPP: 325.0000 mg | RECTAL | Status: DC | PRN

## 2011-06-05 MED ORDER — ATORVASTATIN CALCIUM 40 MG PO TABS: 40.0000 mg | ORAL_TABLET | Freq: Every day | ORAL | Status: DC

## 2011-06-05 MED ORDER — OXYCODONE HCL 5 MG PO TABS: 5.0000 mg | ORAL_TABLET | ORAL | Status: DC | PRN

## 2011-06-05 MED ORDER — ASPIRIN EC 325 MG PO TBEC
325.0000 mg | DELAYED_RELEASE_TABLET | Freq: Every day | ORAL | Status: DC
  Administered 2011-06-06 – 2011-06-13 (×7): 325 mg via ORAL
  Filled 2011-06-05 (×11): qty 1

## 2011-06-05 MED ORDER — DONEPEZIL HCL 10 MG PO TABS
10.0000 mg | ORAL_TABLET | Freq: Every day | ORAL | Status: DC
  Administered 2011-06-06 – 2011-06-13 (×7): 10 mg via ORAL
  Filled 2011-06-05 (×8): qty 1

## 2011-06-05 MED ORDER — ATORVASTATIN CALCIUM 40 MG PO TABS
40.0000 mg | ORAL_TABLET | Freq: Every day | ORAL | Status: DC
  Administered 2011-06-06 – 2011-06-13 (×7): 40 mg via ORAL
  Filled 2011-06-05 (×8): qty 1

## 2011-06-05 MED ORDER — METOPROLOL TARTRATE 1 MG/ML IV SOLN: 2.0000 mg | INTRAVENOUS | Status: DC | PRN

## 2011-06-05 MED ORDER — HYDRALAZINE HCL 20 MG/ML IJ SOLN
10.0000 mg | INTRAMUSCULAR | Status: DC | PRN
  Filled 2011-06-05: qty 0.5

## 2011-06-05 MED ORDER — TAMSULOSIN HCL 0.4 MG PO CAPS
0.4000 mg | ORAL_CAPSULE | Freq: Every day | ORAL | Status: DC
  Administered 2011-06-06 – 2011-06-12 (×8): 0.4 mg via ORAL
  Filled 2011-06-05 (×11): qty 1

## 2011-06-05 MED ORDER — SENNOSIDES-DOCUSATE SODIUM 8.6-50 MG PO TABS
1.0000 | ORAL_TABLET | Freq: Every evening | ORAL | Status: DC | PRN
  Filled 2011-06-05: qty 1

## 2011-06-05 MED ORDER — PANTOPRAZOLE SODIUM 40 MG PO TBEC
40.0000 mg | DELAYED_RELEASE_TABLET | Freq: Every day | ORAL | Status: DC
  Administered 2011-06-05 – 2011-06-13 (×6): 40 mg via ORAL
  Filled 2011-06-05 (×9): qty 1

## 2011-06-05 MED ORDER — RAMIPRIL 5 MG PO TABS: 5.0000 mg | ORAL_TABLET | Freq: Every day | ORAL | Status: DC

## 2011-06-05 MED ORDER — FESOTERODINE FUMARATE ER 4 MG PO TB24: 4.0000 mg | ORAL_TABLET | Freq: Every day | ORAL | Status: DC

## 2011-06-05 MED ORDER — EZETIMIBE 10 MG PO TABS
10.0000 mg | ORAL_TABLET | Freq: Every day | ORAL | Status: DC
  Administered 2011-06-06 – 2011-06-12 (×8): 10 mg via ORAL
  Filled 2011-06-05 (×11): qty 1

## 2011-06-05 MED ORDER — CILOSTAZOL 100 MG PO TABS
100.0000 mg | ORAL_TABLET | Freq: Two times a day (BID) | ORAL | Status: DC
  Administered 2011-06-06 – 2011-06-13 (×15): 100 mg via ORAL
  Filled 2011-06-05 (×19): qty 1

## 2011-06-05 MED ORDER — TAMSULOSIN HCL 0.4 MG PO CAPS: 0.4000 mg | ORAL_CAPSULE | Freq: Every day | ORAL | Status: DC

## 2011-06-05 MED ORDER — DOCUSATE SODIUM 100 MG PO CAPS
100.0000 mg | ORAL_CAPSULE | Freq: Two times a day (BID) | ORAL | Status: DC
  Administered 2011-06-06 – 2011-06-12 (×14): 100 mg via ORAL
  Filled 2011-06-05 (×19): qty 1

## 2011-06-05 MED ORDER — PHENOL 1.4 % MT LIQD
1.0000 | OROMUCOSAL | Status: DC | PRN
Start: 2011-06-05 — End: 2011-06-05

## 2011-06-05 MED ORDER — METOPROLOL SUCCINATE ER 25 MG PO TB24
37.5000 mg | ORAL_TABLET | Freq: Two times a day (BID) | ORAL | Status: DC
  Administered 2011-06-06 – 2011-06-12 (×13): 37.5 mg via ORAL
  Administered 2011-06-12: 23:00:00 via ORAL
  Administered 2011-06-13: 37.5 mg via ORAL
  Filled 2011-06-05 (×19): qty 1

## 2011-06-05 MED ORDER — RAMIPRIL 5 MG PO CAPS
5.0000 mg | ORAL_CAPSULE | Freq: Every day | ORAL | Status: DC
  Administered 2011-06-06 – 2011-06-13 (×7): 5 mg via ORAL
  Filled 2011-06-05 (×8): qty 1

## 2011-06-05 NOTE — Progress Notes (Signed)
ANTIBIOTIC CONSULT NOTE - INITIAL  Pharmacy Consult for vancomycin Indication: Left thoracic wound infection s/p axillary fem-pop graft / Right thoracic graft site  Allergies  Allergen Reactions  . Shrimp (Shellfish Allergy) Nausea And Vomiting    Patient Measurements: Height: 5\' 4"  (162.6 cm) Weight: 130 lb (58.968 kg) IBW/kg (Calculated) : 59.2   Vital Signs: Temp: 98.3 F (36.8 C) (04/11 1347) Temp src: Oral (04/11 1347) BP: 133/48 mmHg (04/11 1347) Pulse Rate: 88  (04/11 1347)    Labs: No results found for this basename: WBC:3,HGB:3,PLT:3,LABCREA:3,CREATININE:3 in the last 72 hours Estimated Creatinine Clearance: 35 ml/min (by C-G formula based on Cr of 1.31). No results found for this basename: VANCOTROUGH:2,VANCOPEAK:2,VANCORANDOM:2,GENTTROUGH:2,GENTPEAK:2,GENTRANDOM:2,TOBRATROUGH:2,TOBRAPEAK:2,TOBRARND:2,AMIKACINPEAK:2,AMIKACINTROU:2,AMIKACIN:2, in the last 72 hours  Last scr ~1.3, est. CrCl ~22ml/min  Microbiology: Recent Results (from the past 720 hour(s))  WOUND CULTURE     Status: Normal   Collection Time   05/31/11 11:50 AM      Component Value Range Status Comment   Specimen Description CHEST RIGHT INCISION   Final    Special Requests NONE   Final    Gram Stain     Final    Value: NO WBC SEEN     NO SQUAMOUS EPITHELIAL CELLS SEEN     NO ORGANISMS SEEN   Culture NO GROWTH 2 DAYS   Final    Report Status 06/02/2011 FINAL   Final     Medical History: Past Medical History  Diagnosis Date  . Hyperlipidemia     takes Atorvastatin daily  . CAD (coronary artery disease)   . Gastritis   . BPH (benign prostatic hypertrophy)     Dr.Nesi is urologist  . Arthritis   . Claudication   . Hypertension     takes Ramipril and Metoprolol daily  . Heart murmur   . Peripheral vascular disease   . Asthma     as a young child  . Bruises easily     takes Pletal and ASA daily  . Colitis     hx of  . Hx of colonic polyps   . Urinary urgency     takes Flomax daily    . Diabetes mellitus     borderline diabetic  . Blind     right eye  . Insomnia     takes Ambien nightly  . Mental disorder     takes Aricept daily  . History of shingles 3-60yrs ago    Medications:  Prescriptions prior to admission  Medication Sig Dispense Refill  . acetaminophen (TYLENOL) 325 MG tablet Take 1-2 tablets (325-650 mg total) by mouth every 4 (four) hours as needed (or temp >/= 101 F).  20 tablet  0  . aspirin EC 325 MG tablet Take 325 mg by mouth daily.      Marland Kitchen atorvastatin (LIPITOR) 40 MG tablet Take 40 mg by mouth daily.      . cilostazol (PLETAL) 100 MG tablet Take 100 mg by mouth 2 (two) times daily.       Marland Kitchen docusate sodium (COLACE) 100 MG capsule Take 100 mg by mouth daily.      Marland Kitchen donepezil (ARICEPT) 10 MG tablet Take 10 mg by mouth daily.      Marland Kitchen ezetimibe (ZETIA) 10 MG tablet Take 10 mg by mouth at bedtime.        . fesoterodine (TOVIAZ) 4 MG TB24 Take 4 mg by mouth daily.        . metoprolol succinate (TOPROL-XL) 25 MG 24 hr  tablet Take 37.5 mg by mouth 2 (two) times daily.       . nitroGLYCERIN (NITROSTAT) 0.4 MG SL tablet Place 0.4 mg under the tongue every 5 (five) minutes as needed. For chest pain      . OVER THE COUNTER MEDICATION Take 30 mLs by mouth 2 (two) times daily. Protein supplement      . oxyCODONE-acetaminophen (PERCOCET) 5-325 MG per tablet Take 1 tablet by mouth Every 4 hours as needed. For pain.      . ramipril (ALTACE) 5 MG tablet Take 5 mg by mouth daily.        Marland Kitchen sulfamethoxazole-trimethoprim (BACTRIM DS) 800-160 MG per tablet Take 1 tablet by mouth 2 (two) times daily.      . Tamsulosin HCl (FLOMAX) 0.4 MG CAPS Take 0.4 mg by mouth at bedtime.       . vitamin C (ASCORBIC ACID) 500 MG tablet Take 500 mg by mouth 2 (two) times daily.      . Vitamin D, Ergocalciferol, (DRISDOL) 50000 UNITS CAPS       . zolpidem (AMBIEN) 10 MG tablet Take 10 mg by mouth at bedtime as needed. For sleep       Assessment: 78 year old male admitted post office  visit with drainage from axillary portion of the bypass wound on his chest wall. According to Dr.Fields note he was given an rx for an antibiotic in the ED but it was never filled. Will start IV vancomycin in hospital and follow plans for surgery to remove the infected portion of the graft.   Goal of Therapy:  Vancomycin trough level 10-15 mcg/ml  Plan:  Vancomycin 1000mg  IV q 24 hours Check trough prior to 4th dose Follow renal function and make adjustments as needed.  [name] 06/05/2011,5:42 PM

## 2011-06-05 NOTE — Progress Notes (Signed)
Patient returns for followup today. He underwent an axillary bifemoral bypass with bilateral femoral endarterectomies and a right femoropopliteal bypass on March 11. He was fairly deconditioned postoperatively. He he was recently noted to have some drainage from the axillary portion of the bypass wound on his chest wall. He apparently was seen in the emergency room for this an antibiotic prescription was given but not filled for some reason. He denies any fever or chills. He is still fairly deconditioned overall. He currently does not have rest pain in his feet.  Past Medical History  Diagnosis Date  . Hyperlipidemia     takes Atorvastatin daily  . CAD (coronary artery disease)   . Gastritis   . BPH (benign prostatic hypertrophy)     Dr.Nesi is urologist  . Arthritis   . Claudication   . Hypertension     takes Ramipril and Metoprolol daily  . Heart murmur   . Peripheral vascular disease   . Asthma     as a young child  . Bruises easily     takes Pletal and ASA daily  . Colitis     hx of  . Hx of colonic polyps   . Urinary urgency     takes Flomax daily  . Diabetes mellitus     borderline diabetic  . Blind     right eye  . Insomnia     takes Ambien nightly  . Mental disorder     takes Aricept daily  . History of shingles 3-19yrs ago     Past Surgical History  Procedure Date  . Tonsillectomy 1960  . Coronary artery bypass graft 1989/2001    5 vessels;1 valve   . Valve replacement 2001  . Cardiac catheterization     multiple-see epic  . Eye surgery     cataract left eye  . Hemorrhoid surgery 1960  . Coronary angioplasty     right leg  . Colonoscopy   . Axillary-femoral bypass graft 05/05/2011    Procedure: BYPASS GRAFT AXILLA-BIFEMORAL;  Surgeon: Sherren Kerns, MD;  Location: [facility] OR;  Service: Vascular;  Laterality: Right;  . Femoral-popliteal bypass graft 05/05/2011    Procedure: BYPASS GRAFT FEMORAL-POPLITEAL ARTERY;  Surgeon: Sherren Kerns, MD;  Location: [facility]  OR;  Service: Vascular;  Laterality: Right;    Current Outpatient Prescriptions on File Prior to Visit  Medication Sig Dispense Refill  . acetaminophen (TYLENOL) 325 MG tablet Take 1-2 tablets (325-650 mg total) by mouth every 4 (four) hours as needed (or temp >/= 101 F).  20 tablet  0  . aspirin EC 325 MG tablet Take 325 mg by mouth daily.      Marland Kitchen atorvastatin (LIPITOR) 40 MG tablet Take 40 mg by mouth daily.      . cilostazol (PLETAL) 100 MG tablet Take 100 mg by mouth 2 (two) times daily.       Marland Kitchen docusate sodium (COLACE) 100 MG capsule Take 100 mg by mouth daily.      Marland Kitchen donepezil (ARICEPT) 10 MG tablet Take 10 mg by mouth daily.      Marland Kitchen ezetimibe (ZETIA) 10 MG tablet Take 10 mg by mouth at bedtime.        . fesoterodine (TOVIAZ) 4 MG TB24 Take 4 mg by mouth daily.        . metoprolol succinate (TOPROL-XL) 25 MG 24 hr tablet Take 37.5 mg by mouth 2 (two) times daily.       . nitroGLYCERIN (NITROSTAT) 0.4  MG SL tablet Place 0.4 mg under the tongue every 5 (five) minutes as needed. For chest pain      . OVER THE COUNTER MEDICATION Take 30 mLs by mouth 2 (two) times daily. Protein supplement      . ramipril (ALTACE) 5 MG tablet Take 5 mg by mouth daily.        . Tamsulosin HCl (FLOMAX) 0.4 MG CAPS Take 0.4 mg by mouth at bedtime.       . vitamin C (ASCORBIC ACID) 500 MG tablet Take 500 mg by mouth 2 (two) times daily.      Marland Kitchen zolpidem (AMBIEN) 10 MG tablet Take 10 mg by mouth at bedtime as needed. For sleep      . DISCONTD: fluvastatin XL (LESCOL XL) 80 MG 24 hr tablet Take 80 mg by mouth at bedtime.         No current facility-administered medications on file prior to visit.   Allergies  Allergen Reactions  . Shrimp (Shellfish Allergy) Nausea And Vomiting    Review of systems: Denies shortness of breath but does get short of breath with exertion  Physical exam: Filed Vitals:   06/05/11 1347  BP: 133/48  Pulse: 88  Temp: 98.3 F (36.8 C)  TempSrc: Oral  Resp: 16  Height: 5\' 4"   (1.626 m)  Weight: 130 lb (58.968 kg)     Right axillary incision is well-healed  Where the graft is tunneled on his right lateral chest wall there is approximate 7 cm of erythema, there is some mucopurulent drainage from his right chest wall which is yellowish in color  Both groin incisions are well-healed with no drainage, there is good Doppler flow in the axis down and the fem-fem  Feet are warm and well-perfused bilaterally  Chest: Clear to auscultation  Cardiac: Regular rate and rhythm  Assessment: Graft infection of axillary portion of axbifem  Plan: We will admit him today for IV antibiotics. I will remove the infected portion of the graft early next week. We will try to revise this so the graft is not occluded.  He was advised today of the high risk of bilateral limb loss.  Fabienne Bruns, MD [facility] [place] Office: [phone] Pager: [phone]

## 2011-06-06 LAB — URINALYSIS, ROUTINE W REFLEX MICROSCOPIC
Nitrite: NEGATIVE
Specific Gravity, Urine: 1.021 (ref 1.005–1.030)
Urobilinogen, UA: 1 mg/dL (ref 0.0–1.0)

## 2011-06-06 MED ORDER — ZOLPIDEM TARTRATE 5 MG PO TABS
5.0000 mg | ORAL_TABLET | Freq: Every evening | ORAL | Status: DC | PRN
  Administered 2011-06-06 – 2011-06-07 (×3): 5 mg via ORAL
  Filled 2011-06-06 (×3): qty 1

## 2011-06-06 NOTE — Progress Notes (Signed)
Pt ambulated with cane on RA and NT at side 150 ft.  Tolerated well, back to bed with alarm on and call bell in reach. Ave Filter

## 2011-06-06 NOTE — Progress Notes (Signed)
Pt ambulated >300 ft with wife, cane, and on RA.  Steady gait and pace, tolerated very well, back to bed with call bell in reach and wife at bedside.  Will continue to monitor and encourage. Mr.

## 2011-06-06 NOTE — Progress Notes (Signed)
Utilization review completed. Rhylan Gross, RN, BSN. 06/06/11 

## 2011-06-06 NOTE — Progress Notes (Signed)
[facility] [place]  Subjective  - No real complaints  Objective 94/47 82 98.4 F (36.9 C) (Oral) 18 96%  Intake/Output Summary (Last 24 hours) at 06/06/11 1628 Last data filed at 06/06/11 0548  Gross per 24 hour  Intake      0 ml  Output    500 ml  Net   -500 ml   Scant drainage right chest wall, graft patent, still some erythema  Assessment/Planning: S/P Ax bifem right fem pop with infected axillary graft Possible I and D next week if not improved Continue to ambulate Renal insufficiency with increased creatinine encourage PO fluid intake  [name] 06/06/2011 4:28 PM --  Laboratory Lab Results:  [facility] 06/05/11 2047  WBC 5.1  HGB 11.7*  HCT 35.7*  PLT 87*   BMET  Basename 06/05/11 2047  NA 140  K 3.9  CL 104  CO2 29  GLUCOSE 95  BUN 20  CREATININE 1.62*  CALCIUM 8.7    COAG Lab Results  Component Value Date   INR 1.32 06/05/2011   INR 1.22 05/02/2011   No results found for this basename: PTT    Antibiotics Anti-infectives     Start     Dose/Rate Route Frequency Ordered Stop   06/05/11 1800   vancomycin (VANCOCIN) IVPB 1000 mg/200 mL premix  Status:  Discontinued        1,000 mg 200 mL/hr over 60 Minutes Intravenous Every 12 hours 06/05/11 1723 06/05/11 1740   06/05/11 1800   vancomycin (VANCOCIN) IVPB 1000 mg/200 mL premix        1,000 mg 200 mL/hr over 60 Minutes Intravenous Every 24 hours 06/05/11 1740

## 2011-06-06 NOTE — Evaluation (Signed)
Physical Therapy Evaluation Patient Details Name: [name]. MRN: [id number] DOB: Jan 23, 1925 Today's Date: 06/06/2011  Problem List:  Patient Active Problem List  Diagnoses  . Peripheral vascular disease, unspecified  . PVD (peripheral vascular disease) with claudication    Past Medical History:  Past Medical History  Diagnosis Date  . Hyperlipidemia     takes Atorvastatin daily  . CAD (coronary artery disease)   . Gastritis   . BPH (benign prostatic hypertrophy)     Dr.Nesi is urologist  . Arthritis   . Claudication   . Hypertension     takes Ramipril and Metoprolol daily  . Heart murmur   . Peripheral vascular disease   . Asthma     as a young child  . Bruises easily     takes Pletal and ASA daily  . Colitis     hx of  . Hx of colonic polyps   . Urinary urgency     takes Flomax daily  . Diabetes mellitus     borderline diabetic  . Insomnia     takes Ambien nightly  . History of shingles 3-67yrs ago  . Blind right eye     "since age 21-4; S/P whooping cough"  . Mental disorder     takes Aricept daily  . DEMENTIA    Past Surgical History:  Past Surgical History  Procedure Date  . Colonoscopy   . Axillary-femoral bypass graft 05/05/2011    Procedure: BYPASS GRAFT AXILLA-BIFEMORAL;  Surgeon: Sherren Kerns, MD;  Location: [facility] OR;  Service: Vascular;  Laterality: Right;  . Femoral-popliteal bypass graft 05/05/2011    Procedure: BYPASS GRAFT FEMORAL-POPLITEAL ARTERY;  Surgeon: Sherren Kerns, MD;  Location: [facility] OR;  Service: Vascular;  Laterality: Right;  . Tonsillectomy 1960  . Aortic valve replacement 06/2003    /E-chart  . Band hemorrhoidectomy 1960's  . Ostectomy 09/2001    right hip/E-chart  . Cardiac catheterization     multiple-see epic  . Coronary angioplasty     right leg  . Coronary angioplasty with stent placement 2000    /E-chart  . Coronary artery bypass graft 1989/06/2003    CABG X 5; CABG X1  . Cardiac valve replacement 06/2003  .  Cataract extraction     left eye    PT Assessment/Plan/Recommendation PT Assessment Clinical Impression Statement: Pt readmitted after recent axillary to bifem BPG with thoracic infection with plans to redo portion of graft. Pt near baseline and moving well with slight variation in gait. Will follow to maximize independence after surgery. Do not anticipate additional needs outside acute setting. Encourage ambulation daily with nursing. PT Recommendation/Assessment: Patient will need skilled PT in the acute care venue PT Problem List: Decreased activity tolerance;Decreased balance Barriers to Discharge: None Barriers to Discharge Comments: wife available 24hrs PT Therapy Diagnosis : Abnormality of gait PT Plan PT Frequency: Min 3X/week PT Treatment/Interventions: Gait training;Balance training;Functional mobility training;Patient/family education PT Recommendation Equipment Recommended: None recommended by PT PT Goals  Acute Rehab PT Goals Pt will go Sit to Stand: with modified independence PT Goal: Sit to Stand - Progress: Goal set today Pt will go Stand to Sit: with modified independence PT Goal: Stand to Sit - Progress: Goal set today Pt will Stand: Independently;1 - 2 min;with no upper extremity support PT Goal: Stand - Progress: Goal set today Pt will Ambulate: >150 feet;with least restrictive assistive device;with modified independence PT Goal: Ambulate - Progress: Goal set today Pt will Go Up /  Down Stairs: 1-2 stairs;with min assist;with least restrictive assistive device PT Goal: Up/Down Stairs - Progress: Goal set today  PT Evaluation Precautions/Restrictions  Precautions Precautions: Fall Restrictions Weight Bearing Restrictions: No Prior Functioning  Home Living Lives With: Spouse Available Help at Discharge: Available 24 hours/day Type of Home: House Home Access: Stairs to enter [employer] of Steps: 1 Entrance Stairs-Rails: None Home Layout: One  level Bathroom Shower/Tub: Engineer, manufacturing systems: Standard Home Adaptive Equipment: Straight cane Prior Function Level of Independence: Independent with assistive device(s);Needs assistance Needs Assistance: Light Housekeeping;Meal Prep Meal Prep: Maximal Light Housekeeping: Maximal Driving: No (not since last admission but plans to) Vocation: Retired Comments: Pt was very independent prior to last admission, went to SNF and just returned home 3 days ago. Pt's spouse does the housework and cooking Cognition Cognition Arousal/Alertness: Awake/alert Overall Cognitive Status: Appears within functional limits for tasks assessed Orientation Level: Oriented X4 Sensation/Coordination Sensation Light Touch: Not tested Extremity Assessment   Mobility (including Balance) Bed Mobility Bed Mobility: Yes Supine to Sit: 6: Modified independent (Device/Increase time) Sitting - Scoot to Edge of Bed: 6: Modified independent (Device/Increase time) Transfers Transfers: Yes Sit to Stand: 6: Modified independent (Device/Increase time);From chair/3-in-1 Stand to Sit: 5: Supervision;To bed Stand to Sit Details: cueing for safety to back fully to surface before sitting Ambulation/Gait Ambulation/Gait Assistance: 5: Supervision Ambulation/Gait Assistance Details (indicate cue type and reason): supervision for safety pt with periods of near scissoring crossing to midline with right foot but no LOB Ambulation Distance (Feet): 500 Feet Assistive device: Straight cane Gait Pattern: Step-through pattern;Decreased stride length (increased adduction of RLE) Stairs: No  Static Standing Balance Static Standing - Balance Support: Right upper extremity supported Static Standing - Level of Assistance: 5: Stand by assistance Exercise    End of Session PT - End of Session Activity Tolerance: Patient tolerated treatment well Patient left: in bed;with call bell in reach General Behavior During  Session: [facility] for tasks performed Cognition: [facility] for tasks performed  Delorse Lek 06/06/2011, 10:52 AM  Delaney Meigs, PT [phone]

## 2011-06-07 LAB — BASIC METABOLIC PANEL
BUN: 20 mg/dL (ref 6–23)
GFR calc Af Amer: 60 mL/min — ABNORMAL LOW (ref >90)
GFR calc non Af Amer: 52 mL/min — ABNORMAL LOW (ref >90)
Potassium: 3.8 mEq/L (ref 3.5–5.1)
Sodium: 138 mEq/L (ref 135–145)

## 2011-06-07 NOTE — Progress Notes (Addendum)
[facility] [place]  Progress Note Bypass Surgery  Date of Surgery: Ax bifem right fem pop with infected axillary graft  Surgeon:  Dr. Darrick Penna  History of Present Illness  Mark Florendo Daegon Deiss. is a 78 y.o. male who is S/P  right.  The patient's pre-op symptoms of drainage from graft in the thorax area are Unchanged . Patients pain is well controlled.        Imaging: Dg Chest 2 View  06/05/2011  *RADIOLOGY REPORT*  Clinical Data: Preoperative.  CHEST - 2 VIEW  Comparison: 05/08/2011.  Findings: Cardiomegaly.  Bioprosthetic aortic valve.  Tortuous thoracic aorta with aortic atherosclerosis.  Old granulomatous disease is present with calcifications in the right upper lobe. Basilar atelectasis, greater on the left than right.  No airspace disease or effusion.  IMPRESSION: Cardiomegaly and postoperative changes of aortic valve replacement. Old granulomatous disease.  No acute cardiopulmonary disease.  Original Report Authenticated By: Andreas Newport, M.D.    Significant Diagnostic Studies: CBC Lab Results  Component Value Date   WBC 5.1 06/05/2011   HGB 11.7* 06/05/2011   HCT 35.7* 06/05/2011   MCV 92.5 06/05/2011   PLT 87* 06/05/2011    BMET     Component Value Date/Time   NA 140 06/05/2011 2047   K 3.9 06/05/2011 2047   CL 104 06/05/2011 2047   CO2 29 06/05/2011 2047   GLUCOSE 95 06/05/2011 2047   BUN 20 06/05/2011 2047   CREATININE 1.62* 06/05/2011 2047   CALCIUM 8.7 06/05/2011 2047   GFRNONAA 37* 06/05/2011 2047   GFRAA 43* 06/05/2011 2047    COAG Lab Results  Component Value Date   INR 1.32 06/05/2011   INR 1.22 05/02/2011   No results found for this basename: PTT    Physical Examination  BP Readings from Last 3 Encounters:  06/07/11 132/57  06/05/11 133/48  05/31/11 136/68   Temp Readings from Last 3 Encounters:  06/07/11 97.6 F (36.4 C) Oral  06/05/11 98.3 F (36.8 C) Oral  05/31/11 98.2 F (36.8 C)    SpO2 Readings from Last 3 Encounters:    06/07/11 97%  05/31/11 99%  05/13/11 97%   Pulse Readings from Last 3 Encounters:  06/07/11 81  06/05/11 88  05/31/11 99    Patient is afebrile, dressing is clean with min. purulent drainage.  Clean dry dressing changes daily.  Patient ambulates independent from bed to chair.skin in bilateral le clean dry and warm.   Assessment/Plan: Pt. Doing well Post-op pain is controlled Wounds are draining at lateral chest right side  Continue antibiotics Continue wound care as ordered  Mosetta Pigeon [phone] 06/07/2011 8:27 AM   Agree with above Continue with IV antibiotics per Dr. Darrick Penna with plans for drainage of infected chest wall area on Tuesday The patient shows no systemic signs of sepsis  Durene Cal

## 2011-06-07 NOTE — Progress Notes (Signed)
Pt ambulated 300 ft with wife. Pt tolerated well with no complaints. Pt in chair with chair alarm activated. Will continue to monitor

## 2011-06-08 MED ORDER — ZOLPIDEM TARTRATE 5 MG PO TABS
10.0000 mg | ORAL_TABLET | Freq: Every evening | ORAL | Status: DC | PRN
  Administered 2011-06-08: 10 mg via ORAL
  Administered 2011-06-09: 7.5 mg via ORAL
  Administered 2011-06-10 – 2011-06-11 (×2): 10 mg via ORAL
  Administered 2011-06-12 (×2): 5 mg via ORAL
  Filled 2011-06-08 (×2): qty 1
  Filled 2011-06-08 (×3): qty 2

## 2011-06-08 MED ORDER — ZOLPIDEM TARTRATE 5 MG PO TABS
5.0000 mg | ORAL_TABLET | Freq: Every evening | ORAL | Status: DC | PRN
  Filled 2011-06-08 (×2): qty 1

## 2011-06-08 MED ORDER — VANCOMYCIN HCL 1000 MG IV SOLR
750.0000 mg | Freq: Two times a day (BID) | INTRAVENOUS | Status: DC
  Administered 2011-06-09 – 2011-06-13 (×9): 750 mg via INTRAVENOUS
  Filled 2011-06-08 (×12): qty 750

## 2011-06-08 NOTE — Progress Notes (Signed)
Upon assessment pt stated "Iam tired to that thing beeping [chair alarm] every time I move. If I want to walk in my room I am going to and it can bee". Pt was very agitated. Explained to pt the importance of the bed and chair alarms and the hospitals falls bundle policy. Pt wife and daughter were in the room and restated my comments. Pt was very agitated with all of [place] and repeating that he would "do what he wanted". About an hour later the pt apologized for his behavior. Reeducated the pt about the importance of the bed and chair alarm. Pt said stated "I understand but the hospital is going overboard with it". Pt currently on chair alarm. Will continue to monitor

## 2011-06-08 NOTE — Progress Notes (Addendum)
ANTIBIOTIC CONSULT NOTE - FOLLOW UP  Pharmacy Consult for Vanco Indication: Infected bypass graft site.  Allergies  Allergen Reactions  . Shrimp (Shellfish Allergy) Nausea And Vomiting    "haven't eaten any since 1980's"    Patient Measurements: Height: 5\' 4"  (162.6 cm) Weight: 130 lb (58.968 kg) IBW/kg (Calculated) : 59.2  Adjusted Body Weight:    Vital Signs: Temp: 98.7 F (37.1 C) (04/14 1446) Temp src: Oral (04/14 1446) BP: 113/48 mmHg (04/14 1446) Pulse Rate: 76  (04/14 1446) Intake/Output from previous day: 04/13 0701 - 04/14 0700 In: 480 [P.O.:480] Out: 1625 [Urine:1625] Intake/Output from this shift: Total I/O In: 480 [P.O.:480] Out: -   Labs:  Basename 06/07/11 0650 06/05/11 2047  WBC -- 5.1  HGB -- 11.7*  PLT -- 87*  LABCREA -- --  CREATININE 1.24 1.62*   Estimated Creatinine Clearance: 37 ml/min (by C-G formula based on Cr of 1.24).  Basename 06/08/11 1725  VANCOTROUGH 9.1*  VANCOPEAK --  Drue Dun --  GENTTROUGH --  GENTPEAK --  GENTRANDOM --  TOBRATROUGH --  TOBRAPEAK --  TOBRARND --  AMIKACINPEAK --  AMIKACINTROU --  AMIKACIN --     Microbiology: Recent Results (from the past 720 hour(s))  WOUND CULTURE     Status: Normal   Collection Time   05/31/11 11:50 AM      Component Value Range Status Comment   Specimen Description CHEST RIGHT INCISION   Final    Special Requests NONE   Final    Gram Stain     Final    Value: NO WBC SEEN     NO SQUAMOUS EPITHELIAL CELLS SEEN     NO ORGANISMS SEEN   Culture NO GROWTH 2 DAYS   Final    Report Status 06/02/2011 FINAL   Final     Anti-infectives     Start     Dose/Rate Route Frequency Ordered Stop   06/05/11 1800   vancomycin (VANCOCIN) IVPB 1000 mg/200 mL premix  Status:  Discontinued        1,000 mg 200 mL/hr over 60 Minutes Intravenous Every 12 hours 06/05/11 1723 06/05/11 1740   06/05/11 1800   vancomycin (VANCOCIN) IVPB 1000 mg/200 mL premix        1,000 mg 200 mL/hr over 60  Minutes Intravenous Every 24 hours 06/05/11 1740            Assessment::infected thoracic bypass graft site  for surgery to remove infected portion next week. Plan for drainage of chest wall 4/16- currently has sm open area/prurulent drainage. Scr down to 1.24. Vanco trough 9.1 slightly < goal.   Goal of Therapy:  Vancomycin trough level 10-15 mcg/ml  Plan:  Change Vanco to 750mg  IV q12h.  Misty Stanley Stillinger 06/08/2011,6:29 PM

## 2011-06-08 NOTE — Progress Notes (Signed)
06/08/2011 6:53 PM Nursing note Spoke with pharmacist regarding new vancomycin dosing for am 4/15. Verbal clarification received ok to administer this evening's dose. Will continue to monitor.  Olena Willy, Blanchard Kelch

## 2011-06-08 NOTE — Progress Notes (Signed)
Pt ambulated 434ft with cane. Pt tolerated well. No complaints. Returned to bed, bed alarm set. Will continue to monitor

## 2011-06-08 NOTE — Progress Notes (Signed)
Pt ambulated 433ft with a cane at a fast pace without stopping. Pt tolerated well. No pt complaints. Pt returned to chair, chair alarm activated, call bell within reach, told pt to call for assistance. Will continue to monitor

## 2011-06-08 NOTE — Progress Notes (Signed)
[facility] [place]  Subjective  -   No acute events No complaints   Physical Exam:  palp femoral pulses Right chest wall with small open area and purulent drainage      Assessment/Plan:    con't IV abx Plan for operative I&D by Dr. Darrick Penna on Tuesday  Jenilyn Magana IV, V. WELLS 06/08/2011 8:59 AM --  Filed Vitals:   06/08/11 0453  BP: 130/70  Pulse: 84  Temp: 98.2 F (36.8 C)  Resp: 18    Intake/Output Summary (Last 24 hours) at 06/08/11 0859 Last data filed at 06/08/11 0500  Gross per 24 hour  Intake    480 ml  Output   1350 ml  Net   -870 ml     Laboratory CBC    Component Value Date/Time   WBC 5.1 06/05/2011 2047   HGB 11.7* 06/05/2011 2047   HCT 35.7* 06/05/2011 2047   PLT 87* 06/05/2011 2047    BMET    Component Value Date/Time   NA 138 06/07/2011 0650   K 3.8 06/07/2011 0650   CL 105 06/07/2011 0650   CO2 26 06/07/2011 0650   GLUCOSE 109* 06/07/2011 0650   BUN 20 06/07/2011 0650   CREATININE 1.24 06/07/2011 0650   CALCIUM 8.3* 06/07/2011 0650   GFRNONAA 52* 06/07/2011 0650   GFRAA 60* 06/07/2011 0650    COAG Lab Results  Component Value Date   INR 1.32 06/05/2011   INR 1.22 05/02/2011   No results found for this basename: PTT    Antibiotics Anti-infectives     Start     Dose/Rate Route Frequency Ordered Stop   06/05/11 1800   vancomycin (VANCOCIN) IVPB 1000 mg/200 mL premix  Status:  Discontinued        1,000 mg 200 mL/hr over 60 Minutes Intravenous Every 12 hours 06/05/11 1723 06/05/11 1740   06/05/11 1800   vancomycin (VANCOCIN) IVPB 1000 mg/200 mL premix        1,000 mg 200 mL/hr over 60 Minutes Intravenous Every 24 hours 06/05/11 1740             V. Durene Cal IV, M.D. [facility] [place] Office: [phone] Pager:  [phone]

## 2011-06-09 NOTE — Progress Notes (Signed)
[facility] Progress Note  06/09/2011 10:24 AM   Subjective:  No complaints.  Tm 98.9  110s-140s systolic  HR 70-80s reg  99%RA Filed Vitals:   06/09/11 0900  BP: 135/62  Pulse: 85  Temp: 98.9 F (37.2 C)  Resp: 18    Physical Exam: Incisions:  Bilateral groin incisions are c/d/i.  Right chest wall incision without drainage.  Lateral right chest incision-did not appreciate any purulent drainage. Extremities:  BLE warm and well perfused.  CBC    Component Value Date/Time   WBC 5.1 06/05/2011 2047   RBC 3.86* 06/05/2011 2047   HGB 11.7* 06/05/2011 2047   HCT 35.7* 06/05/2011 2047   PLT 87* 06/05/2011 2047   MCV 92.5 06/05/2011 2047   MCH 30.3 06/05/2011 2047   MCHC 32.8 06/05/2011 2047   RDW 15.6* 06/05/2011 2047   LYMPHSABS 0.9 05/02/2011 1157   MONOABS 0.4 05/02/2011 1157   EOSABS 0.2 05/02/2011 1157   BASOSABS 0.0 05/02/2011 1157    BMET    Component Value Date/Time   NA 138 06/07/2011 0650   K 3.8 06/07/2011 0650   CL 105 06/07/2011 0650   CO2 26 06/07/2011 0650   GLUCOSE 109* 06/07/2011 0650   BUN 20 06/07/2011 0650   CREATININE 1.24 06/07/2011 0650   CALCIUM 8.3* 06/07/2011 0650   GFRNONAA 52* 06/07/2011 0650   GFRAA 60* 06/07/2011 0650    INR    Component Value Date/Time   INR 1.32 06/05/2011 2047     Intake/Output Summary (Last 24 hours) at 06/09/11 1024 Last data filed at 06/09/11 1022  Gross per 24 hour  Intake   1060 ml  Output   1545 ml  Net   -485 ml     Assessment/Plan:  78 y.o. male is s/p aortobifem graft 05/05/11 and returned for infected axillary portion. -pt for OR tomorrow by Dr. Darrick Penna. -continue ABx   Newton Pigg, PA-C [facility] [phone] 06/09/2011 10:24 AM

## 2011-06-09 NOTE — Progress Notes (Signed)
Infection has coalesced to 3 cm area over graft Will plan I and D with revision in OR tomorrow  NPO p midnight Consent Continue antibiotics  Fabienne Bruns, MD [facility] [place] Office: [phone] Pager: [phone]

## 2011-06-10 ENCOUNTER — Encounter (HOSPITAL_COMMUNITY)
Admission: AD | Disposition: A | Discharge status: Home Health | Payer: Self-pay | Source: Ambulatory Visit | Attending: Vascular Surgery

## 2011-06-10 ENCOUNTER — Ambulatory Visit (HOSPITAL_COMMUNITY): Admission: RE | Admit: 2011-06-10 | Payer: Medicare Other | Source: Ambulatory Visit | Admitting: Vascular Surgery

## 2011-06-10 ENCOUNTER — Inpatient Hospital Stay (HOSPITAL_COMMUNITY): Payer: Medicare Other | Admitting: Certified Registered"

## 2011-06-10 ENCOUNTER — Encounter (HOSPITAL_COMMUNITY): Payer: Self-pay | Admitting: Certified Registered"

## 2011-06-10 DIAGNOSIS — T827XXA Infection and inflammatory reaction due to other cardiac and vascular devices, implants and grafts, initial encounter: Secondary | ICD-10-CM

## 2011-06-10 HISTORY — PX: AXILLARY-FEMORAL BYPASS GRAFT: SHX894

## 2011-06-10 LAB — BASIC METABOLIC PANEL
BUN: 19 mg/dL (ref 6–23)
Chloride: 107 mEq/L (ref 96–112)
Creatinine, Ser: 1.16 mg/dL (ref 0.50–1.35)
GFR calc Af Amer: 65 mL/min — ABNORMAL LOW (ref >90)
GFR calc non Af Amer: 56 mL/min — ABNORMAL LOW (ref >90)
Potassium: 3.7 mEq/L (ref 3.5–5.1)

## 2011-06-10 LAB — CBC
HCT: 35.6 % — ABNORMAL LOW (ref 39.0–52.0)
MCHC: 33.1 g/dL (ref 30.0–36.0)
MCV: 92 fL (ref 78.0–100.0)
Platelets: 100 10*3/uL — ABNORMAL LOW (ref 150–400)
RDW: 15.2 % (ref 11.5–15.5)
WBC: 5.6 10*3/uL (ref 4.0–10.5)

## 2011-06-10 LAB — MRSA PCR SCREENING: MRSA by PCR: NEGATIVE

## 2011-06-10 LAB — GLUCOSE, CAPILLARY: Glucose-Capillary: 128 mg/dL — ABNORMAL HIGH (ref 70–99)

## 2011-06-10 SURGERY — CREATION, BYPASS, ARTERIAL, AXILLARY TO BILATERAL FEMORAL, USING GRAFT
Anesthesia: General | Site: Axilla | Laterality: Right | Wound class: Dirty or Infected

## 2011-06-10 MED ORDER — MORPHINE SULFATE 4 MG/ML IJ SOLN: 0.0500 mg/kg | INTRAMUSCULAR | Status: DC | PRN

## 2011-06-10 MED ORDER — EPHEDRINE SULFATE 50 MG/ML IJ SOLN
INTRAMUSCULAR | Status: DC | PRN
  Administered 2011-06-10: 15 mg via INTRAVENOUS

## 2011-06-10 MED ORDER — OXYCODONE-ACETAMINOPHEN 5-325 MG PO TABS
1.0000 | ORAL_TABLET | ORAL | Status: DC | PRN
  Filled 2011-06-10: qty 1

## 2011-06-10 MED ORDER — LACTATED RINGERS IV SOLN
INTRAVENOUS | Status: DC | PRN
  Administered 2011-06-10 (×3): via INTRAVENOUS

## 2011-06-10 MED ORDER — HEMOSTATIC AGENTS (NO CHARGE) OPTIME
TOPICAL | Status: DC | PRN
  Administered 2011-06-10: 2 via TOPICAL

## 2011-06-10 MED ORDER — LIDOCAINE HCL (CARDIAC) 20 MG/ML IV SOLN
INTRAVENOUS | Status: DC | PRN
  Administered 2011-06-10: 50 mg via INTRAVENOUS

## 2011-06-10 MED ORDER — ONDANSETRON HCL 4 MG/2ML IJ SOLN
INTRAMUSCULAR | Status: DC | PRN
  Administered 2011-06-10: 4 mg via INTRAVENOUS

## 2011-06-10 MED ORDER — SODIUM CHLORIDE 0.9 % IV SOLN
INTRAVENOUS | Status: DC
  Administered 2011-06-10 – 2011-06-12 (×4): via INTRAVENOUS

## 2011-06-10 MED ORDER — LACTATED RINGERS IV SOLN: INTRAVENOUS | Status: DC

## 2011-06-10 MED ORDER — 0.9 % SODIUM CHLORIDE (POUR BTL) OPTIME
TOPICAL | Status: DC | PRN
  Administered 2011-06-10: 2000 mL

## 2011-06-10 MED ORDER — VECURONIUM BROMIDE 10 MG IV SOLR
INTRAVENOUS | Status: DC | PRN
  Administered 2011-06-10: 2 mg via INTRAVENOUS
  Administered 2011-06-10: 7 mg via INTRAVENOUS

## 2011-06-10 MED ORDER — HYDROMORPHONE HCL PF 1 MG/ML IJ SOLN: 0.2500 mg | INTRAMUSCULAR | Status: DC | PRN

## 2011-06-10 MED ORDER — SODIUM CHLORIDE 0.9 % IR SOLN
Status: DC | PRN
  Administered 2011-06-10: 10:00:00

## 2011-06-10 MED ORDER — SULFAMETHOXAZOLE-TMP DS 800-160 MG PO TABS
1.0000 | ORAL_TABLET | Freq: Two times a day (BID) | ORAL | Status: DC
  Administered 2011-06-10 – 2011-06-13 (×6): 1 via ORAL
  Filled 2011-06-10 (×8): qty 1

## 2011-06-10 MED ORDER — PROPOFOL 10 MG/ML IV EMUL
INTRAVENOUS | Status: DC | PRN
  Administered 2011-06-10: 100 mg via INTRAVENOUS

## 2011-06-10 MED ORDER — HEPARIN SODIUM (PORCINE) 1000 UNIT/ML IJ SOLN
INTRAMUSCULAR | Status: DC | PRN
  Administered 2011-06-10: 7000 [IU] via INTRAVENOUS

## 2011-06-10 MED ORDER — THROMBIN 20000 UNITS EX KIT
PACK | CUTANEOUS | Status: DC | PRN
  Administered 2011-06-10 (×2): 20000 [IU] via TOPICAL

## 2011-06-10 MED ORDER — PHENYLEPHRINE HCL 10 MG/ML IJ SOLN
INTRAMUSCULAR | Status: DC | PRN
  Administered 2011-06-10: 80 ug via INTRAVENOUS
  Administered 2011-06-10: 40 ug via INTRAVENOUS
  Administered 2011-06-10 (×2): 80 ug via INTRAVENOUS
  Administered 2011-06-10: 40 ug via INTRAVENOUS

## 2011-06-10 MED ORDER — PROTAMINE SULFATE 10 MG/ML IV SOLN
INTRAVENOUS | Status: DC | PRN
  Administered 2011-06-10: 70 mg via INTRAVENOUS

## 2011-06-10 MED ORDER — SUFENTANIL CITRATE 50 MCG/ML IV SOLN
INTRAVENOUS | Status: DC | PRN
  Administered 2011-06-10: 10 ug via INTRAVENOUS
  Administered 2011-06-10 (×2): 5 ug via INTRAVENOUS
  Administered 2011-06-10: 10 ug via INTRAVENOUS
  Administered 2011-06-10 (×2): 5 ug via INTRAVENOUS

## 2011-06-10 MED ORDER — MORPHINE SULFATE 2 MG/ML IJ SOLN
2.0000 mg | INTRAMUSCULAR | Status: DC | PRN
  Administered 2011-06-10: 2 mg via INTRAVENOUS
  Filled 2011-06-10: qty 1

## 2011-06-10 SURGICAL SUPPLY — 61 items
ADH SKN CLS APL DERMABOND .7 (GAUZE/BANDAGES/DRESSINGS) ×3
BANDAGE GAUZE ELAST BULKY 4 IN (GAUZE/BANDAGES/DRESSINGS) ×1 IMPLANT
CANISTER SUCTION 2500CC (MISCELLANEOUS) ×2 IMPLANT
CATH EMB 4FR 80CM (CATHETERS) ×1 IMPLANT
CATH EMB 5FR 80CM (CATHETERS) ×1 IMPLANT
CLIP TI MEDIUM 24 (CLIP) ×3 IMPLANT
CLIP TI WIDE RED SMALL 24 (CLIP) ×2 IMPLANT
CLOTH BEACON ORANGE TIMEOUT ST (SAFETY) ×2 IMPLANT
COVER SURGICAL LIGHT HANDLE (MISCELLANEOUS) ×5 IMPLANT
DERMABOND ADVANCED (GAUZE/BANDAGES/DRESSINGS) ×3
DERMABOND ADVANCED .7 DNX12 (GAUZE/BANDAGES/DRESSINGS) IMPLANT
DRAIN SNY 10X20 3/4 PERF (WOUND CARE) IMPLANT
DRAPE INCISE IOBAN 66X45 STRL (DRAPES) ×3 IMPLANT
DRAPE WARM FLUID 44X44 (DRAPE) ×2 IMPLANT
DRSG COVADERM 4X10 (GAUZE/BANDAGES/DRESSINGS) ×2 IMPLANT
DRSG COVADERM 4X8 (GAUZE/BANDAGES/DRESSINGS) ×2 IMPLANT
ELECT REM PT RETURN 9FT ADLT (ELECTROSURGICAL) ×2
ELECTRODE REM PT RTRN 9FT ADLT (ELECTROSURGICAL) ×1 IMPLANT
EVACUATOR SILICONE 100CC (DRAIN) IMPLANT
GAUZE SPONGE 4X4 16PLY XRAY LF (GAUZE/BANDAGES/DRESSINGS) ×1 IMPLANT
GLOVE BIO SURGEON STRL SZ7 (GLOVE) ×1 IMPLANT
GLOVE BIO SURGEON STRL SZ7.5 (GLOVE) ×3 IMPLANT
GLOVE BIOGEL PI IND STRL 6.5 (GLOVE) IMPLANT
GLOVE BIOGEL PI IND STRL 7.0 (GLOVE) IMPLANT
GLOVE BIOGEL PI IND STRL 7.5 (GLOVE) IMPLANT
GLOVE BIOGEL PI INDICATOR 6.5 (GLOVE) ×1
GLOVE BIOGEL PI INDICATOR 7.0 (GLOVE) ×2
GLOVE BIOGEL PI INDICATOR 7.5 (GLOVE) ×2
GLOVE ORTHOPEDIC STR SZ6.5 (GLOVE) ×1 IMPLANT
GLOVE SS BIOGEL STRL SZ 7 (GLOVE) IMPLANT
GLOVE SUPERSENSE BIOGEL SZ 7 (GLOVE) ×2
GLOVE SURG SS PI 7.5 STRL IVOR (GLOVE) ×1 IMPLANT
GOWN PREVENTION PLUS XLARGE (GOWN DISPOSABLE) ×3 IMPLANT
GOWN STRL NON-REIN LRG LVL3 (GOWN DISPOSABLE) ×4 IMPLANT
GRAFT PROPATEN W/RING 8X80X70 (Vascular Products) ×1 IMPLANT
KIT BASIN OR (CUSTOM PROCEDURE TRAY) ×2 IMPLANT
KIT ROOM TURNOVER OR (KITS) ×2 IMPLANT
NS IRRIG 1000ML POUR BTL (IV SOLUTION) ×4 IMPLANT
PACK PERIPHERAL VASCULAR (CUSTOM PROCEDURE TRAY) ×2 IMPLANT
PAD ARMBOARD 7.5X6 YLW CONV (MISCELLANEOUS) ×4 IMPLANT
SPONGE GAUZE 4X4 12PLY (GAUZE/BANDAGES/DRESSINGS) ×1 IMPLANT
SPONGE LAP 18X18 X RAY DECT (DISPOSABLE) ×1 IMPLANT
SPONGE SURGIFOAM ABS GEL 100 (HEMOSTASIS) ×2 IMPLANT
STAPLER VISISTAT 35W (STAPLE) ×2 IMPLANT
SUT PROLENE 5 0 C 1 24 (SUTURE) ×3 IMPLANT
SUT PROLENE 6 0 CC (SUTURE) ×3 IMPLANT
SUT SILK 2 0 FS (SUTURE) ×2 IMPLANT
SUT SILK 2 0 SH (SUTURE) ×1 IMPLANT
SUT VIC AB 2-0 CT1 27 (SUTURE) ×2
SUT VIC AB 2-0 CT1 TAPERPNT 27 (SUTURE) IMPLANT
SUT VIC AB 2-0 CTX 36 (SUTURE) ×4 IMPLANT
SUT VIC AB 3-0 SH 27 (SUTURE) ×4
SUT VIC AB 3-0 SH 27X BRD (SUTURE) ×2 IMPLANT
SUT VICRYL 4-0 PS2 18IN ABS (SUTURE) ×4 IMPLANT
SWAB CULTURE LIQUID MINI MALE (MISCELLANEOUS) ×3 IMPLANT
SYR 3ML LL SCALE MARK (SYRINGE) ×1 IMPLANT
TAPE CLOTH SURG 4X10 WHT LF (GAUZE/BANDAGES/DRESSINGS) ×1 IMPLANT
TOWEL OR 17X24 6PK STRL BLUE (TOWEL DISPOSABLE) ×4 IMPLANT
TOWEL OR 17X26 10 PK STRL BLUE (TOWEL DISPOSABLE) ×4 IMPLANT
TRAY FOLEY CATH 14FRSI W/METER (CATHETERS) ×2 IMPLANT
WATER STERILE IRR 1000ML POUR (IV SOLUTION) ×2 IMPLANT

## 2011-06-10 NOTE — Anesthesia Postprocedure Evaluation (Signed)
  Anesthesia Post-op Note  Patient: [name].  Procedure(s) Performed: Procedure(s) (LRB): BYPASS GRAFT AXILLA-BIFEMORAL (Right) INCISION AND DRAINAGE ABSCESS (Right)  Patient Location: PACU  Anesthesia Type: General  Level of Consciousness: awake  Airway and Oxygen Therapy: Patient Spontanous Breathing  Post-op Pain: mild  Post-op Assessment: Post-op Vital signs reviewed  Post-op Vital Signs: Reviewed  Complications: No apparent anesthesia complications

## 2011-06-10 NOTE — Anesthesia Preprocedure Evaluation (Addendum)
Anesthesia Evaluation  Patient identified by MRN, date of birth, ID band Patient awake    Reviewed: Allergy & Precautions, H&P , NPO status , Patient's Chart, lab work & pertinent test results, reviewed documented beta blocker date and time   Airway Mallampati: II TM Distance: >3 FB Neck ROM: Full    Dental  (+) Partial Upper   Pulmonary asthma ,  breath sounds clear to auscultation        Cardiovascular hypertension, Pt. on medications and Pt. on home beta blockers + CAD Rhythm:Regular Rate:Normal     Neuro/Psych    GI/Hepatic negative GI ROS, Neg liver ROS,   Endo/Other  Diabetes mellitus-, Well Controlled, Type 2, Oral Hypoglycemic Agents  Renal/GU negative Renal ROS     Musculoskeletal   Abdominal   Peds  Hematology   Anesthesia Other Findings   Reproductive/Obstetrics                          Anesthesia Physical Anesthesia Plan  ASA: III  Anesthesia Plan: General   Post-op Pain Management:    Induction: Intravenous  Airway Management Planned: Oral ETT  Additional Equipment:   Intra-op Plan:   Post-operative Plan: Extubation in OR  Informed Consent: I have reviewed the patients History and Physical, chart, labs and discussed the procedure including the risks, benefits and alternatives for the proposed anesthesia with the patient or authorized representative who has indicated his/her understanding and acceptance.   Dental advisory given  Plan Discussed with: CRNA, Anesthesiologist and Surgeon  Anesthesia Plan Comments:         Anesthesia Quick Evaluation

## 2011-06-10 NOTE — Progress Notes (Signed)
Pt for I and D and revision of Ax fem today  Fabienne Bruns, MD [facility] [place] Office: [phone] Pager: [phone]

## 2011-06-10 NOTE — Progress Notes (Signed)
Physical Therapy Cancellation Note: pt s/p redo of axillary graft and with increased bleeding post-op. Will defer tx today to next date. Thanks Delaney Meigs, PT [phone]

## 2011-06-10 NOTE — Progress Notes (Signed)
Physical Therapy Cancellation Note: pt currently in OR for I&D and unable to participate in therapy. Will attempt later this afternoon if pt able. Thanks Delaney Meigs, PT [phone]

## 2011-06-10 NOTE — Preoperative (Signed)
Beta Blockers   Reason not to administer Beta Blockers:metoprolol given 2114hrs on 06/09/2011

## 2011-06-10 NOTE — Progress Notes (Signed)
Moderated drainage ans swelling noted at r.groin, Dr.Fields notified and evaluated at bedside, ok to change r.groin dressing, will cont to assess

## 2011-06-10 NOTE — Op Note (Signed)
Procedure: Revision of right axillary bifemoral bypass #2 thrombectomy of right axillary femoral bypass #3 removal of old right axillary femoral bypass  Preoperative diagnosis: Infected right axillary femoral bypass  Postoperative diagnosis: Same  Anesthesia: Gen.  Asst.: Lianne Cure PA-C  Operative findings: Well incorporated proximal and distal right axillary femoral bypass #2 intraoperative cultures sent from axillary and right groin level #3 axillary graft were placed and tunneled medial to the old graft using 8 mm ring supported propaten  Operative details: after obtaining informed consent, the patient was taken to the operating room. The patient was placed in supine position on the operating room table. After induction of general anesthesia and endotracheal intubation, the patient was prepped and draped in the usual sterile fashion from the chin to the right groin. Next a longitudinal incision was made over the axillary femoral graft approximately 3 cm above a pre-existing right groin incision. The incision was carried down to the level of the graft. The graft was fairly well incorporated. There is no obvious purulent material. The graft was dissected free circumferentially. A vessel loop was placed around this. Attention was then turned to the axillary portion of the graft. A longitudinal incision was made her pre-existing scar in the infraclavicular region. The incision was carried down through the subcutaneous tissues and pectoralis muscle to the level of the axillary femoral bypass graft. This also was well incorporated. It was dissected free circumferentially and a vessel loop placed around this.  Cultures were swabbed from each portion of the graft.  A new 8 mm propaten ring supported graft was brought up in the operative field and tunneled from axillary incision down to the new groin incision.  The patient was then given 7000 units of intravenous heparin. The graft was clamped  approximately 4 cm distal to the axillary anastomosis. It was ligated with a 2 silk suture ligature just beyond this. The graft was then divided. The graft was pushed down into its pre-existing tunnel and the tunnel closed over with 2-0 Vicryl stitch. Attention was then turned to the new groin incision. In similar fashion the graft was clamped distally. The graft was ligated with a 2-0 silk tie just above the clamp. The graft was then divided. The distal limb the graft was then pushed up into the tunnel in the subcutaneous tissues of the old tunnel and closed with a 2 0 vicryl stitch to prevent contamination with the new anastomosis. The new 8 mm graft was then sewn to the distal end of the graft using a running 5-0 Prolene suture. Upon clamping the graft it was noted that the distal end of the graft was thrombosed. Therefore, a #4 and #5 Fogarty catheter was passed multiple times down the distal portion of the graft. A large amount of thrombus and pseudo-intima was removed. I was able to get a 5 Fogarty catheter passed all the way to the hub of the catheter  >70 cm. There was good backbleeding from the graft at this point. It was presumed that the graft had occluded during dissection. This was thoroughly flushed with heparinized saline. Remainder of the anastomosis and the new groin incision was then completed. The graft was clamped in the incision. Attention was then turned to the axillary incision. The new graft was cut to length and several rings removed for anastomosis proximally. The graft was briefly opened proximally to flush forward to make sure that the axillary anastomosis was patent. The old axillary portion of the graft was then sewn  to the new graft end-to-end using a running 5-0 Prolene suture. At completion anastomosis everything was forebled backbled and thoroughly flushed. Anastomosis was secured clamps were released and there was palpable flow in the distal axillary femoral bypass immediately.  There was good Doppler flow in the femoral-femoral bypass. Both anastomoses were packed with thrombin Gelfoam. The heparin was reversed with 70 mg of protamine. After hemostasis had been obtained, both incisions were thoroughly irrigated with normal saline and the new groin incision as well as axillary incision were closed with multiple layers of 2 0 and 3 0 Vicryl suture. The skin incisions were then closed with staples. Dermabond was applied to the skin to have a water tight seal  over the skin incisions. The skin incisions were then also covered with dressings. Attention was then turned to the lateral aspect of the chest wall to the prior ax fem bypass graft. A longitudinal incision was made along the right lateral chest wall down to the level of the old axfem bypass graft. There is no obvious purulent material but the graft was poorly incorporated. There was some bloody material around the graft. Gentle traction on the graft allowed the graft to be freed completely and all of this was removed. This was then swabbed and sent for culture. The wound was thoroughly irrigated with normal saline solution and packed with gauze.  After all dressings had been applied and the drapes been removed the patient's feet were examined and there were posterior tibial Doppler signals bilaterally. There is also a good Doppler signal in fem-fem bypass graft. The patient tolerated procedure well there were no complications. Instrument sponge and needle counts were correct in the case. Patient was taken to recover in stable condition.  Fabienne Bruns, MD [facility] [place] Office: [phone] Pager: [phone]

## 2011-06-10 NOTE — Transfer of Care (Signed)
Immediate Anesthesia Transfer of Care Note  Patient: [name].  Procedure(s) Performed: Procedure(s) (LRB): BYPASS GRAFT AXILLA-BIFEMORAL (Right) INCISION AND DRAINAGE ABSCESS (Right)  Patient Location: PACU  Anesthesia Type: General  Level of Consciousness: awake  Airway & Oxygen Therapy: Patient Spontanous Breathing and Patient connected to nasal cannula oxygen  Post-op Assessment: Report given to PACU RN, Post -op Vital signs reviewed and stable and Patient moving all extremities  Post vital signs: Reviewed and stable  Complications: No apparent anesthesia complications

## 2011-06-11 ENCOUNTER — Encounter (HOSPITAL_COMMUNITY): Payer: Self-pay | Admitting: Vascular Surgery

## 2011-06-11 LAB — BASIC METABOLIC PANEL
Chloride: 107 mEq/L (ref 96–112)
Creatinine, Ser: 1.17 mg/dL (ref 0.50–1.35)
GFR calc Af Amer: 64 mL/min — ABNORMAL LOW (ref >90)

## 2011-06-11 LAB — CBC
MCV: 92 fL (ref 78.0–100.0)
Platelets: 80 10*3/uL — ABNORMAL LOW (ref 150–400)
RDW: 15.4 % (ref 11.5–15.5)
WBC: 6.8 10*3/uL (ref 4.0–10.5)

## 2011-06-11 NOTE — Progress Notes (Addendum)
[facility] [place]  Progress Note Bypass Surgery  Date of Surgery: 06/05/2011 - 06/10/2011  Procedure(s):Revision axillary graft BYPASS GRAFT AXILLA-BIFEMORAL INCISION AND DRAINAGE ABSCESS Surgeon: Surgeon(s): Sherren Kerns, MD  1 Day Post-Op  History of Present Illness  [name] Lavontae Cornia. is a 78 y.o. male who is S/P Procedure(s): BYPASS GRAFT AXILLA-BIFEMORAL INCISION AND DRAINAGE ABSCESS right.  The patient's pre-op symptoms of wound infection and pain are Improved . Patients pain is well controlled.       Imaging: No results found.  Significant Diagnostic Studies: CBC Lab Results  Component Value Date   WBC 6.8 06/11/2011   HGB 9.8* 06/11/2011   HCT 29.8* 06/11/2011   MCV 92.0 06/11/2011   PLT 80* 06/11/2011    BMET     Component Value Date/Time   NA 138 06/11/2011 0400   K 3.9 06/11/2011 0400   CL 107 06/11/2011 0400   CO2 25 06/11/2011 0400   GLUCOSE 119* 06/11/2011 0400   BUN 14 06/11/2011 0400   CREATININE 1.17 06/11/2011 0400   CALCIUM 7.9* 06/11/2011 0400   GFRNONAA 55* 06/11/2011 0400   GFRAA 64* 06/11/2011 0400    COAG Lab Results  Component Value Date   INR 1.32 06/05/2011   INR 1.22 05/02/2011   No results found for this basename: PTT    Physical Examination  BP Readings from Last 3 Encounters:  06/11/11 119/36  06/11/11 119/36  06/05/11 133/48   Temp Readings from Last 3 Encounters:  06/11/11 100.8 F (38.2 C) Oral  06/11/11 100.8 F (38.2 C) Oral  06/05/11 98.3 F (36.8 C) Oral   SpO2 Readings from Last 3 Encounters:  06/11/11 97%  06/11/11 97%  05/31/11 99%   Pulse Readings from Last 3 Encounters:  06/11/11 96  06/11/11 96  06/05/11 88    Pt is A&O x 3 Right side thoracic graft site extremity: Incision/s is/are clean,dry.intact with  Drainage over night.  New compression dressing in place this am applied at 4 am. Limb is warm; with good color Left femoral pulse doppler, and both anterior ankle doppler  pulses and feet are warm to touch   Assessment/Plan: Pt. Doing well Post-op pain is controlled Wounds are clean, dry, intact Continue wound care as ordered Plan to d/c home with doxycycline PO  Clinton Gallant [facility] [phone] 06/11/2011 7:56 AM    Doppler signals in fem fem PT doppler in feet Pain controlled  Start wound care right lateral chest wall today Transfer 2000 Out of bed ambulate PT consult Continue Vanc and Zosyn for now pending culture results He will need long term antibiotic therapy will tailor based on culture results This will most likely be oral and not require PICC line  Fabienne Bruns, MD [facility] [place] Office: [phone] Pager: [phone]

## 2011-06-11 NOTE — Progress Notes (Signed)
Physical Therapy Note   06/11/11 1400  PT Visit Information  Last PT Received On 06/11/11  Precautions  Precautions Fall  Restrictions  Weight Bearing Restrictions No  Bed Mobility  Bed Mobility Yes  Supine to Sit 6: Modified independent (Device/Increase time)  Sitting - Scoot to Edge of Bed 6: Modified independent (Device/Increase time)  Transfers  Transfers Yes  Sit to Stand 5: Supervision;With upper extremity assist;From bed  Sit to Stand Details (indicate cue type and reason) mildly unsteady   Stand to Sit 5: Supervision;With upper extremity assist;To chair/3-in-1  Stand to Sit Details cues to use UEs and control descent  Ambulation/Gait  Ambulation/Gait Yes  Ambulation/Gait Assistance 5: Supervision  Ambulation/Gait Assistance Details (indicate cue type and reason) pt mildly unsteady with narrow BOS, occasional lateral sway with pt able to self-correct with cane  Ambulation Distance (Feet) 200 Feet (200, 150)  Assistive device Straight cane  Gait Pattern Step-through pattern;Decreased stride length  Stairs No  Wheelchair Mobility  Wheelchair Mobility No  Posture/Postural Control  Posture/Postural Control No significant limitations  Balance  Balance Assessed Yes  Static Standing Balance  Static Standing - Balance Support Right upper extremity supported  Static Standing - Level of Assistance 5: Stand by assistance  PT - End of Session  Equipment Utilized During Treatment Gait belt  Activity Tolerance Patient tolerated treatment well  Patient left in chair;with call bell in reach (Chair alarm)  Nurse Communication Mobility status for transfers;Mobility status for ambulation  General  Behavior During Session [facility] for tasks performed  Cognition Impaired, at baseline  PT - Assessment/Plan  Comments on Treatment Session pt rpesents generally weak and deconditioned, but motivated to ambulate.  pt needed one sitting rest break  PT Plan Discharge plan remains  appropriate;Frequency remains appropriate  PT Frequency Min 3X/week  Follow Up Recommendations No PT follow up  Equipment Recommended None recommended by PT  Acute Rehab PT Goals  PT Goal: Sit to Stand - Progress Progressing toward goal  PT Goal: Stand to Sit - Progress Progressing toward goal  PT Goal: Stand - Progress Progressing toward goal  PT Goal: Ambulate - Progress Progressing toward goal    Mack Hook, PT [phone]

## 2011-06-11 NOTE — Progress Notes (Signed)
Pt TX to 2034, called report, VSS

## 2011-06-11 NOTE — Progress Notes (Signed)
ANTIBIOTIC CONSULT NOTE - FOLLOW UP  Pharmacy Consult for Vanco Indication: Infected bypass graft site.  Allergies  Allergen Reactions  . Shrimp (Shellfish Allergy) Nausea And Vomiting    "haven't eaten any since 1980's"    Patient Measurements: Height: 5\' 4"  (162.6 cm) Weight: 139 lb 1.8 oz (63.1 kg) IBW/kg (Calculated) : 59.2  Adjusted Body Weight:    Vital Signs: Temp: 99.2 F (37.3 C) (04/17 1100) Temp src: Oral (04/17 1100) BP: 106/42 mmHg (04/17 1100) Pulse Rate: 96  (04/17 0425) Intake/Output from previous day: 04/16 0701 - 04/17 0700 In: 4016.7 [I.V.:3716.7; IV Piggyback:300] Out: 1725 [Urine:1625; Blood:100] Intake/Output from this shift: Total I/O In: -  Out: 400 [Urine:400]  Labs:  [facility] 06/11/11 0400 06/10/11 0515  WBC 6.8 5.6  HGB 9.8* 11.8*  PLT 80* 100*  LABCREA -- --  CREATININE 1.17 1.16   Estimated Creatinine Clearance: 39.4 ml/min (by C-G formula based on Cr of 1.17).  Basename 06/08/11 1725  VANCOTROUGH 9.1*  VANCOPEAK --  Drue Dun --  GENTTROUGH --  GENTPEAK --  GENTRANDOM --  TOBRATROUGH --  TOBRAPEAK --  TOBRARND --  AMIKACINPEAK --  AMIKACINTROU --  AMIKACIN --     Microbiology: Recent Results (from the past 720 hour(s))  WOUND CULTURE     Status: Normal   Collection Time   05/31/11 11:50 AM      Component Value Range Status Comment   Specimen Description CHEST RIGHT INCISION   Final    Special Requests NONE   Final    Gram Stain     Final    Value: NO WBC SEEN     NO SQUAMOUS EPITHELIAL CELLS SEEN     NO ORGANISMS SEEN   Culture NO GROWTH 2 DAYS   Final    Report Status 06/02/2011 FINAL   Final   MRSA PCR SCREENING     Status: Normal   Collection Time   06/10/11  2:01 AM      Component Value Range Status Comment   MRSA by PCR NEGATIVE  NEGATIVE  Final   CULTURE, ROUTINE-ABSCESS     Status: Normal (Preliminary result)   Collection Time   06/10/11  9:11 AM      Component Value Range Status Comment   Specimen  Description ABSCESS HIP RIGHT   Final    Special Requests RIGHT HIP INCISION ABSCESS PT ON VANCO   Final    Gram Stain     Final    Value: NO WBC SEEN     NO SQUAMOUS EPITHELIAL CELLS SEEN     NO ORGANISMS SEEN   Culture NO GROWTH 1 DAY   Final    Report Status PENDING   Incomplete   CULTURE, ROUTINE-ABSCESS     Status: Normal (Preliminary result)   Collection Time   06/10/11  9:23 AM      Component Value Range Status Comment   Specimen Description ABSCESS AXILLA RIGHT   Final    Special Requests RIGHT AXILLARY INCISION PT ON VANCO   Final    Gram Stain     Final    Value: RARE WBC PRESENT,BOTH PMN AND MONONUCLEAR     NO SQUAMOUS EPITHELIAL CELLS SEEN     NO ORGANISMS SEEN   Culture NO GROWTH 1 DAY   Final    Report Status PENDING   Incomplete   WOUND CULTURE     Status: Normal (Preliminary result)   Collection Time   06/10/11 10:57 AM  Component Value Range Status Comment   Specimen Description WOUND AXILLA GRAFT RIGHT   Final    Special Requests SWAOF OLD RIGHT AXILLARY GRAFT   Final    Gram Stain     Final    Value: NO WBC SEEN     NO SQUAMOUS EPITHELIAL CELLS SEEN     NO ORGANISMS SEEN   Culture NO GROWTH 1 DAY   Final    Report Status PENDING   Incomplete     Anti-infectives     Start     Dose/Rate Route Frequency Ordered Stop   06/10/11 1630  sulfamethoxazole-trimethoprim (BACTRIM DS) 800-160 MG per tablet 1 tablet       1 tablet Oral 2 times daily 06/10/11 1535     06/09/11 0700   vancomycin (VANCOCIN) 750 mg in sodium chloride 0.9 % 150 mL IVPB        750 mg 150 mL/hr over 60 Minutes Intravenous Every 12 hours 06/08/11 1834     06/05/11 1800   vancomycin (VANCOCIN) IVPB 1000 mg/200 mL premix  Status:  Discontinued        1,000 mg 200 mL/hr over 60 Minutes Intravenous Every 12 hours 06/05/11 1723 06/05/11 1740   06/05/11 1800   vancomycin (VANCOCIN) IVPB 1000 mg/200 mL premix  Status:  Discontinued        1,000 mg 200 mL/hr over 60 Minutes Intravenous Every  24 hours 06/05/11 1740 06/08/11 1835          Assessment:: On vancomycin day # 7 for infected chest wall.  S/p drainage of chest wall 4/16.  Cultures negative to date, renal function improving.  Vanco trough 9.1 slightly less than goal on 4/14.   Goal of Therapy:  Vancomycin trough level 10-15 mcg/ml  Plan:  Change Vanco to 750mg  IV q12h. Will check a vancomycin trough in next 1-2 days if IV antibiotics continue. F/U cultures.  Rosette Bellavance, Gwenlyn Found 06/11/2011,1:14 PM

## 2011-06-12 LAB — BASIC METABOLIC PANEL
BUN: 15 mg/dL (ref 6–23)
Chloride: 106 mEq/L (ref 96–112)
GFR calc Af Amer: 62 mL/min — ABNORMAL LOW (ref >90)
Potassium: 3.6 mEq/L (ref 3.5–5.1)

## 2011-06-12 LAB — CBC
HCT: 26.4 % — ABNORMAL LOW (ref 39.0–52.0)
Platelets: 83 10*3/uL — ABNORMAL LOW (ref 150–400)
RDW: 15.5 % (ref 11.5–15.5)
WBC: 7.1 10*3/uL (ref 4.0–10.5)

## 2011-06-12 MED ORDER — DOXYCYCLINE HYCLATE 100 MG PO TABS
100.0000 mg | ORAL_TABLET | Freq: Two times a day (BID) | ORAL | Status: DC
  Administered 2011-06-12 – 2011-06-13 (×3): 100 mg via ORAL
  Filled 2011-06-12 (×4): qty 1

## 2011-06-12 NOTE — Progress Notes (Signed)
[facility] [place]  Subjective  - Less pain today ambulated yesterday  Objective 106/43 94 100.1 F (37.8 C) (Oral) 18 97%  Intake/Output Summary (Last 24 hours) at 06/12/11 0851 Last data filed at 06/12/11 0542  Gross per 24 hour  Intake    480 ml  Output   1475 ml  Net   -995 ml   Incisions chest and groin healing Feet warm bilat Open wound right chest some serous drainage  Assessment/Planning: Continue to ambulate Follow up cultures D/c Foley Possible d/c am Will need home health  Fabienne Bruns, MD [facility] [place] Office: [phone] Pager: [phone]     [name] 06/12/2011 8:51 AM --  Laboratory Lab Results:  Basename 06/12/11 0525 06/11/11 0400  WBC 7.1 6.8  HGB 8.7* 9.8*  HCT 26.4* 29.8*  PLT 83* 80*   BMET  Basename 06/12/11 0525 06/11/11 0400  NA 136 138  K 3.6 3.9  CL 106 107  CO2 24 25  GLUCOSE 114* 119*  BUN 15 14  CREATININE 1.20 1.17  CALCIUM 7.9* 7.9*    COAG Lab Results  Component Value Date   INR 1.32 06/05/2011   INR 1.22 05/02/2011   No results found for this basename: PTT    Antibiotics Anti-infectives     Start     Dose/Rate Route Frequency Ordered Stop   06/10/11 1630  sulfamethoxazole-trimethoprim (BACTRIM DS) 800-160 MG per tablet 1 tablet       1 tablet Oral 2 times daily 06/10/11 1535     06/09/11 0700   vancomycin (VANCOCIN) 750 mg in sodium chloride 0.9 % 150 mL IVPB        750 mg 150 mL/hr over 60 Minutes Intravenous Every 12 hours 06/08/11 1834     06/05/11 1800   vancomycin (VANCOCIN) IVPB 1000 mg/200 mL premix  Status:  Discontinued        1,000 mg 200 mL/hr over 60 Minutes Intravenous Every 12 hours 06/05/11 1723 06/05/11 1740   06/05/11 1800   vancomycin (VANCOCIN) IVPB 1000 mg/200 mL premix  Status:  Discontinued        1,000 mg 200 mL/hr over 60 Minutes Intravenous Every 24 hours 06/05/11 1740 06/08/11 1835

## 2011-06-12 NOTE — Progress Notes (Signed)
Physical Therapy Treatment Patient Details Name: [name]. MRN: [id number] DOB: 10-Aug-1924 Today's Date: 06/12/2011  PT Assessment/Plan  PT - Assessment/Plan Comments on Treatment Session: Pt with significant posterior LOB with nursing tech immediately prior to the intervention of therapist and educated pt for fall risk, update to discharge recommendation and current need for RW. Pt encouraged to continue mobility with assist and HEP. Pt aware of risk of fall and agreeable to HHPT and RW use.  PT Plan: Discharge plan needs to be updated Follow Up Recommendations: Home health PT Equipment Recommended: Rolling walker with 5" wheels PT Goals  Acute Rehab PT Goals PT Goal: Rolling Supine to Right Side - Progress: Discontinued (comment) PT Goal: Rolling Supine to Left Side - Progress: Discontinued (comment) PT Goal: Supine/Side to Sit - Progress: Discontinued (comment) PT Goal: Sit to Supine/Side - Progress: Discontinued (comment) PT Goal: Sit to Stand - Progress: Progressing toward goal PT Goal: Stand to Sit - Progress: Progressing toward goal PT Transfer Goal: Bed to Chair/Chair to Bed - Progress: Discontinued (comment) PT Goal: Stand - Progress: Progressing toward goal PT Goal: Ambulate - Progress: Progressing toward goal PT Goal: Up/Down Stairs - Progress: Progressing toward goal PT Goal: Perform Home Exercise Program - Progress: Discontinued (comment)  PT Treatment Precautions/Restrictions  Precautions Precautions: Fall Restrictions Weight Bearing Restrictions: No Mobility (including Balance) Bed Mobility Bed Mobility: No Transfers Sit to Stand: 5: Supervision;From chair/3-in-1;From toilet Sit to Stand Details (indicate cue type and reason): cueing for safety and hand placement from chair and toilet Stand to Sit: 5: Supervision;To chair/3-in-1;To toilet;4: Min assist Stand to Sit Details: minguard to commode with cueing for hand placement and safety, supervision to  chair Ambulation/Gait Ambulation/Gait: Yes Ambulation/Gait Assistance: 4: Min assist Ambulation/Gait Assistance Details (indicate cue type and reason): minguard. Pt ambulating with nurse tech in hallway directly behind therapist when pt had a posterior LOB and was caught by visitor from falling. PT stepped in to fully recover pt balance and provide RW to continue ambulation distance pt had initiated. Upon return to room NT left and continued therapy session and education. Ambulation Distance (Feet): 225 Feet (225', 15', 15') Assistive device: Rolling walker Gait Pattern: Step-through pattern;Trunk flexed;Decreased stride length Stairs: No  Posture/Postural Control Posture/Postural Control: Postural limitations Postural Limitations: flexed trunk Exercise  General Exercises - Lower Extremity Long Arc Quad: AROM;Both;20 reps;Seated Hip Flexion/Marching: AROM;Both;Other reps (comment);Seated (20reps) End of Session PT - End of Session Activity Tolerance: Patient tolerated treatment well Patient left: in chair;with call bell in reach Nurse Communication: Mobility status for transfers;Mobility status for ambulation General Behavior During Session: [facility] for tasks performed Cognition: [facility] for tasks performed  Delorse Lek 06/12/2011, 11:19 AM Delaney Meigs, PT [phone]

## 2011-06-12 NOTE — Progress Notes (Signed)
Utilization review completed. Anette Guarneri, RN, BSN. 06/12/11

## 2011-06-13 LAB — BASIC METABOLIC PANEL
BUN: 14 mg/dL (ref 6–23)
CO2: 22 mEq/L (ref 19–32)
Chloride: 106 mEq/L (ref 96–112)
Creatinine, Ser: 1.16 mg/dL (ref 0.50–1.35)
GFR calc Af Amer: 65 mL/min — ABNORMAL LOW (ref >90)
Glucose, Bld: 101 mg/dL — ABNORMAL HIGH (ref 70–99)
Potassium: 3.7 mEq/L (ref 3.5–5.1)

## 2011-06-13 LAB — CBC
HCT: 26.1 % — ABNORMAL LOW (ref 39.0–52.0)
Hemoglobin: 8.7 g/dL — ABNORMAL LOW (ref 13.0–17.0)
MCV: 91.6 fL (ref 78.0–100.0)
WBC: 5.7 10*3/uL (ref 4.0–10.5)

## 2011-06-13 LAB — CULTURE, ROUTINE-ABSCESS: Culture: NO GROWTH

## 2011-06-13 MED ORDER — DOXYCYCLINE HYCLATE 100 MG PO TABS: 100.0000 mg | ORAL_TABLET | Freq: Two times a day (BID) | ORAL | Status: AC

## 2011-06-13 MED ORDER — OXYCODONE-ACETAMINOPHEN 5-325 MG PO TABS: 1.0000 | ORAL_TABLET | ORAL | Status: DC | PRN

## 2011-06-13 MED ORDER — MULTIVITAMINS PO TABS: 1.0000 | ORAL_TABLET | Freq: Every day | ORAL | Status: AC

## 2011-06-13 NOTE — Progress Notes (Addendum)
[facility] [place]  Progress Note Bypass Surgery  Date of Surgery: 06/05/2011 - 06/10/2011  Procedure(s): BYPASS GRAFT AXILLA-BIFEMORAL INCISION AND DRAINAGE ABSCESS Surgeon: Surgeon(s): Sherren Kerns, MD  3 Days Post-Op  History of Present Illness  [name]. is a 78 y.o. male who is S/P  BYPASS GRAFT AXILLA-BIFEMORAL INCISION AND DRAINAGE ABSCESS right.   Pt is doing well. Ambulating with walker. HH PT RN for dressing changes   Significant Diagnostic Studies: Cultures _ no growth  CBC Lab Results  Component Value Date   WBC 5.7 06/13/2011   HGB 8.7* 06/13/2011   HCT 26.1* 06/13/2011   MCV 91.6 06/13/2011   PLT 80* 06/13/2011    BMET     Component Value Date/Time   NA 137 06/13/2011 0548   K 3.7 06/13/2011 0548   CL 106 06/13/2011 0548   CO2 22 06/13/2011 0548   GLUCOSE 101* 06/13/2011 0548   BUN 14 06/13/2011 0548   CREATININE 1.16 06/13/2011 0548   CALCIUM 8.0* 06/13/2011 0548   GFRNONAA 56* 06/13/2011 0548   GFRAA 65* 06/13/2011 0548    COAG Lab Results  Component Value Date   INR 1.32 06/05/2011   INR 1.22 05/02/2011   No results found for this basename: PTT    Physical Examination  BP Readings from Last 3 Encounters:  06/13/11 133/59  06/13/11 133/59  06/05/11 133/48   Temp Readings from Last 3 Encounters:  06/13/11 99 F (37.2 C) Oral  06/13/11 99 F (37.2 C) Oral  06/05/11 98.3 F (36.8 C) Oral   SpO2 Readings from Last 3 Encounters:  06/13/11 97%  06/13/11 97%  05/31/11 99%   Pulse Readings from Last 3 Encounters:  06/13/11 94  06/13/11 94  06/05/11 88    Pt is A&O x 3 right upper extremity: Incision/s is/are clean,dry.intact, and  healing without hematoma, erythema  there is min serous drainage from chest wound Right flank wound clean and granulating Doppler flow in graft  Assessment/Plan: Pt. Doing well Post-op pain is controlled Wounds are healing well - no growth per cultures Pt taking adequate  PO - will DC IVF [facility] PT/RN PT/OT for ambulation Continue wound care as ordered  Marlowe Shores [phone] 06/13/2011 11:11 AM   Chest wound clean today Needs to go home on doxycycline D/C Home  Fabienne Bruns, MD [facility] [place] Office: [phone] Pager: [phone]

## 2011-06-13 NOTE — Progress Notes (Signed)
Noted blood oozing from between staples (incision site) to R upper chest area.  Dressing applied, will continue to monitor.

## 2011-06-13 NOTE — Discharge Summary (Signed)
[facility] Discharge Summary   Patient ID:  [name]. MRN: [id number] DOB/AGE: 04-19-1924 78 y.o.  Admit date: 06/05/2011 Discharge date: 06/13/2011 Date of Surgery: 06/05/2011 - 06/10/2011 Surgeon: Moishe Spice): Sherren Kerns, MD  Admission Diagnosis: infected graft PVD;INFECTED GRAFT  Discharge Diagnoses:  infected graft PVD;INFECTED GRAFT  Secondary Diagnoses: Past Medical History  Diagnosis Date  . Hyperlipidemia     takes Atorvastatin daily  . CAD (coronary artery disease)   . Gastritis   . BPH (benign prostatic hypertrophy)     Dr.Nesi is urologist  . Arthritis   . Claudication   . Hypertension     takes Ramipril and Metoprolol daily  . Heart murmur   . Peripheral vascular disease   . Asthma     as a young child  . Bruises easily     takes Pletal and ASA daily  . Colitis     hx of  . Hx of colonic polyps   . Urinary urgency     takes Flomax daily  . Diabetes mellitus     borderline diabetic  . Insomnia     takes Ambien nightly  . History of shingles 3-59yrs ago  . Blind right eye     "since age 53-4; S/P whooping cough"  . Mental disorder     takes Aricept daily  . DEMENTIA     Procedure(s): BYPASS GRAFT AXILLA-BIFEMORAL INCISION AND DRAINAGE ABSCESS  Discharged Condition: good  HPI:  Patient is an 78 year old male who underwent an axillary bifemoral bypass with bilateral femoral endarterectomies and a right femoropopliteal bypass on March 11. He was fairly deconditioned postoperatively. He he was recently noted to have some drainage from the axillary portion of the bypass wound on his chest wall. He apparently was seen in the emergency room for this an antibiotic prescription was given but not filled for some reason. He denies any fever or chills. He is still fairly deconditioned overall. He currently does not have rest pain in his feet. He is admitted for IV antibiotics I&D of abscess and revision of right ax-bi  fem  Hospital Course:  [name]. is a 78 y.o. male is S/P Right Procedure(s): BYPASS GRAFT AXILLA-BIFEMORAL INCISION AND DRAINAGE ABSCESS Extubated: POD # 0 Post-op wounds healing well, open flank wound is clean and granulating in well. Pt. Ambulating, voiding and taking PO diet without difficulty. Pt pain controlled with PO pain meds. Labs as below Complications:none  Cultures Negative with no growth     Significant Diagnostic Studies: CBC Lab Results  Component Value Date   WBC 5.7 06/13/2011   HGB 8.7* 06/13/2011   HCT 26.1* 06/13/2011   MCV 91.6 06/13/2011   PLT 80* 06/13/2011    BMET    Component Value Date/Time   NA 137 06/13/2011 0548   K 3.7 06/13/2011 0548   CL 106 06/13/2011 0548   CO2 22 06/13/2011 0548   GLUCOSE 101* 06/13/2011 0548   BUN 14 06/13/2011 0548   CREATININE 1.16 06/13/2011 0548   CALCIUM 8.0* 06/13/2011 0548   GFRNONAA 56* 06/13/2011 0548   GFRAA 65* 06/13/2011 0548   COAG Lab Results  Component Value Date   INR 1.32 06/05/2011   INR 1.22 05/02/2011     Disposition:  Discharge to :Home Discharge Orders    Future Orders Please Complete By Expires   Resume previous diet      Driving Restrictions      Comments:   No driving  Lifting restrictions      Comments:   No lifting for 6 weeks; avoid raising right arm over head   Call MD for:  temperature >100.5      Call MD for:  redness, tenderness, or signs of infection (pain, swelling, bleeding, redness, odor or green/yellow discharge around incision site)      Call MD for:  severe or increased pain, loss or decreased feeling  in affected limb(s)      Increase activity slowly      Comments:   Walk with assistance use walker or cane as needed   Walker       May shower       Discharge wound care:      Comments:   Wet to dry dressings right flank wound twice daily Change dressing on right chest wound as needed      [name].  Home Medication Instructions [id number]    Printed on:06/13/11 1211  Medication Information                    ezetimibe (ZETIA) 10 MG tablet Take 10 mg by mouth at bedtime.             cilostazol (PLETAL) 100 MG tablet Take 100 mg by mouth 2 (two) times daily.            metoprolol succinate (TOPROL-XL) 25 MG 24 hr tablet Take 37.5 mg by mouth 2 (two) times daily.            nitroGLYCERIN (NITROSTAT) 0.4 MG SL tablet Place 0.4 mg under the tongue every 5 (five) minutes as needed. For chest pain           Tamsulosin HCl (FLOMAX) 0.4 MG CAPS Take 0.4 mg by mouth at bedtime.            fesoterodine (TOVIAZ) 4 MG TB24 Take 4 mg by mouth daily.             zolpidem (AMBIEN) 10 MG tablet Take 10 mg by mouth at bedtime as needed. For sleep           ramipril (ALTACE) 5 MG tablet Take 5 mg by mouth daily.             atorvastatin (LIPITOR) 40 MG tablet Take 40 mg by mouth daily.           donepezil (ARICEPT) 10 MG tablet Take 10 mg by mouth daily.           aspirin EC 325 MG tablet Take 325 mg by mouth daily.           vitamin C (ASCORBIC ACID) 500 MG tablet Take 500 mg by mouth 2 (two) times daily.           sulfamethoxazole-trimethoprim (BACTRIM DS) 800-160 MG per tablet Take 1 tablet by mouth 2 (two) times daily.           ergocalciferol (VITAMIN D2) 50000 UNITS capsule Take 50,000 Units by mouth once a week.           doxycycline (VIBRA-TABS) 100 MG tablet Take 1 tablet (100 mg total) by mouth 2 (two) times daily.           oxyCODONE-acetaminophen (PERCOCET) 5-325 MG per tablet Take 1 tablet by mouth every 4 (four) hours as needed for pain. For pain.           multivitamin (THERAGRAN) per tablet Take 1 tablet by mouth daily.  Verbal and written Discharge instructions given to the patient. Wound care per Discharge AVS  Home Health PT and RN  Doxycycline 100mg  BID x 1 year  Follow-up Information    Follow up with Sherren Kerns, MD in 2 weeks. (office will arrange -sent)    Contact  information:   [address] [place] [address] [phone]          Signed: Marlowe Shores 06/13/2011, 12:11 PM

## 2011-06-13 NOTE — Progress Notes (Signed)
Met with pt and wife re HH needs, HHPT ordered, wife selected [facility], that agency notified. Pt has been using a rolling walker for PT and does not have one at home. Walker ordered from [facility].  Johny Shock RN MPH Case Manager [phone]

## 2011-06-13 NOTE — Progress Notes (Signed)
   CARE MANAGEMENT NOTE 06/13/2011  Patient:  [name], [name]   Account Number:  [id number]  Date Initiated:  06/13/2011  Documentation initiated by:  Virgen Belland  Subjective/Objective Assessment:   Order for [facility] needs.     Action/Plan:   Per PT pt would benefit from HHPT, pt will also need rolling walker. Wife selected [facility] for HHPT.   Anticipated DC Date:  06/13/2011   Anticipated DC Plan:  HOME W HOME HEALTH SERVICES         [facility] Choice  DURABLE MEDICAL EQUIPMENT  HOME HEALTH   Choice offered to / List presented to:  C-3 Spouse   DME arranged  Levan Hurst      DME agency  [facility]     HH arranged  HH-2 PT      [facility] agency  [facility]   Status of service:  Completed, signed off Medicare Important Message given?   (If response is "NO", the following Medicare IM given date fields will be blank) Date Medicare IM given:   Date Additional Medicare IM given:    Discharge Disposition:  HOME W HOME HEALTH SERVICES  Per UR Regulation:    If discussed at Long Length of Stay Meetings, dates discussed:    Comments:

## 2011-06-13 NOTE — Progress Notes (Signed)
Received order to discharge patient to home. Reviewed discharge instructions with patient and his wife. Both verbalized understanding of medications to take, when to call physician (signs of infection -temp over 100, drainage, pain, warmth, and anything abnormal, also advise patient to call 911 for emergencies, follow up appointments, driving restrictions and all information in discharge instructions were reviewed. Patient was given two prescriptions and advise to have filled at pharmacy of their choice. IV removed from patient, copy of discharge instructions given.

## 2011-06-16 ENCOUNTER — Telehealth: Payer: Self-pay

## 2011-06-16 NOTE — Telephone Encounter (Signed)
Home Health Nurse called to report status of pt's surgical sites.  States the axillary wound has some "yellow slough".  Cleaning wound daily and applying wet to dry NS gauze dsg.  Also, reports that large hematoma present in right groin. Describes as "hard", and area "very bruised".  Voiced concern of tracking a firm area from (R) axilla to right groin. Advised [facility] nurse that the right Ax-Bifemoral BP was revised, in addition to thrombectomy, and removal of old (R) Ax-Fem BP.  Home care asking about working pt. in sooner than 06/26/11 for post op check.  Reports VS: T. 98.1, P. 72, R. 16, BP 152/80. Will inform Dr. Darrick Penna of update, and will call pt. With any revision in f/u appt.

## 2011-06-18 ENCOUNTER — Encounter: Payer: Self-pay | Admitting: Vascular Surgery

## 2011-06-19 ENCOUNTER — Ambulatory Visit (INDEPENDENT_AMBULATORY_CARE_PROVIDER_SITE_OTHER): Payer: Medicare Other | Admitting: Vascular Surgery

## 2011-06-19 ENCOUNTER — Encounter: Payer: Self-pay | Admitting: Vascular Surgery

## 2011-06-19 VITALS — BP 166/76 | HR 85 | Temp 98.3°F | Ht 64.0 in | Wt 135.0 lb

## 2011-06-19 DIAGNOSIS — S301XXA Contusion of abdominal wall, initial encounter: Secondary | ICD-10-CM | POA: Insufficient documentation

## 2011-06-19 DIAGNOSIS — S3012XA Contusion of groin, initial encounter: Secondary | ICD-10-CM

## 2011-06-19 DIAGNOSIS — I739 Peripheral vascular disease, unspecified: Secondary | ICD-10-CM

## 2011-06-19 DIAGNOSIS — L98499 Non-pressure chronic ulcer of skin of other sites with unspecified severity: Secondary | ICD-10-CM

## 2011-06-19 DIAGNOSIS — T82898A Other specified complication of vascular prosthetic devices, implants and grafts, initial encounter: Secondary | ICD-10-CM | POA: Insufficient documentation

## 2011-06-19 DIAGNOSIS — I7025 Atherosclerosis of native arteries of other extremities with ulceration: Secondary | ICD-10-CM | POA: Insufficient documentation

## 2011-06-19 NOTE — Progress Notes (Signed)
Patient returns for followup today. He recently underwent revision of his axillary bifemoral bypass for possible infection. He returns today because of some concerns that the home health nurse had concerning his incisions. He states he is ambulating. His feet are warm. He denies any claudication or rest pain symptoms. He is receiving local wound care to his right chest wall from the previous axillary bypass graft. He is on doxycycline 100 mg twice daily. He denies any fever or chills.  Physical exam:  Filed Vitals:   06/19/11 0954  BP: 166/76  Pulse: 85  Temp: 98.3 F (36.8 C)  TempSrc: Oral  Height: 5\' 4"  (1.626 m)  Weight: 135 lb (61.236 kg)    Right axillary incision staples still in place with some edema  Right chest wall 4 cm x 2 cm open wound with granulation tissue beginning  Axillary portion of the bypass graft is palpable throughout the right chest wall. There is some edema around this. This is not more than I would expect after recent postop right groin incision is healing with no obvious drainage or erythema good Doppler signals in the axillary femoral and femoral femoral bypass graft feet were warm bilaterally  Assessment: Healing axillary bifemoral graft revision with no evidence of infection currently  Plan: Continue doxycycline twice daily for at least 6 months. The patient will return in 2 weeks to consider removal of the staples from his groin and axillary incision.  Fabienne Bruns, MD [facility] [place] Office: [phone] Pager: [phone]

## 2011-06-26 ENCOUNTER — Ambulatory Visit: Payer: Medicare Other | Admitting: Vascular Surgery

## 2011-06-27 ENCOUNTER — Telehealth: Payer: Self-pay

## 2011-06-27 NOTE — Telephone Encounter (Signed)
Pt's. wife called to report a green dnge. noted from right chest wound yesterday and today; continuing to do packing with NS gauze and covering with dry gauze dressing twice/day.  Denies pt. Having increased redness, swelling, or inflammation surrounding site.  States he is not chilling or running temperature.  Pt. continues on antibiotic per orders of Dr. Darrick Penna, at last office visit.    Discussed with Dr. Imogene Burn.  Recommends pt. Continue with same treatment course.  Has appt. Next week for f/u.  Advised wife of above.  Verb. Understanding.

## 2011-07-02 ENCOUNTER — Encounter: Payer: Self-pay | Admitting: Vascular Surgery

## 2011-07-03 ENCOUNTER — Encounter: Payer: Self-pay | Admitting: Vascular Surgery

## 2011-07-03 ENCOUNTER — Ambulatory Visit (INDEPENDENT_AMBULATORY_CARE_PROVIDER_SITE_OTHER): Payer: Medicare Other | Admitting: Vascular Surgery

## 2011-07-03 VITALS — BP 148/56 | HR 89 | Resp 16 | Ht 64.0 in | Wt 131.2 lb

## 2011-07-03 DIAGNOSIS — I739 Peripheral vascular disease, unspecified: Secondary | ICD-10-CM

## 2011-07-03 DIAGNOSIS — L98499 Non-pressure chronic ulcer of skin of other sites with unspecified severity: Secondary | ICD-10-CM

## 2011-07-03 NOTE — Progress Notes (Signed)
Patient is an 78 year old male who underwent axillary bifemoral bypass grafting in March 2013.  He subsequently underwent revision of this with replacement of the axillary limb on April 16 for possible infection. He is currently on doxycycline twice daily. He returns today for further followup. He denies any drainage from his axillary or groin incision. He denies any fever or chills. He is receiving home health wound care for his right chest wall incision from the old axillary limb of the graft which was left open.  He is satisfied with his walking distance improvement.  Physical exam:  Blood pressure 148/56, pulse 89, resp. rate 16, height 5\' 4"  (1.626 m), weight 131 lb 3.2 oz (59.512 kg), SpO2 100.00%.   Right infraclavicular incision well-healed  Right groin incision well-healed, palpable femoral femoral pulse, audible graft Doppler signal Right chest wall wound 3 x 1 cm with good granulation tissue at the base no erythema over any portion of the graft  Assessment: Doing well status post axillary bifemoral bypass with patent graft and no evidence of infection  Plan: Followup one month to recheck wounds, staples removed today, continue doxycycline at least 6 months and then reevaluate, will need a graft duplex scan in June of 2013  Fabienne Bruns, MD [facility] [place] Office: [phone] Pager: [phone]

## 2011-07-04 ENCOUNTER — Emergency Department (HOSPITAL_COMMUNITY): Payer: Medicare Other

## 2011-07-04 ENCOUNTER — Encounter (HOSPITAL_COMMUNITY): Payer: Self-pay | Admitting: Certified Registered"

## 2011-07-04 ENCOUNTER — Encounter (HOSPITAL_COMMUNITY): Payer: Self-pay

## 2011-07-04 ENCOUNTER — Encounter (HOSPITAL_COMMUNITY)
Admission: EM | Disposition: A | Discharge status: Home Health | Payer: Self-pay | Source: Home / Self Care | Attending: Orthopedic Surgery

## 2011-07-04 ENCOUNTER — Inpatient Hospital Stay (HOSPITAL_COMMUNITY)
Admission: EM | Admit: 2011-07-04 | Discharge: 2011-07-06 | DRG: 909 | Disposition: A | Discharge status: Home Health | Payer: Medicare Other | Attending: Orthopedic Surgery | Admitting: Orthopedic Surgery

## 2011-07-04 ENCOUNTER — Emergency Department (HOSPITAL_COMMUNITY): Payer: Medicare Other | Admitting: Certified Registered"

## 2011-07-04 DIAGNOSIS — N401 Enlarged prostate with lower urinary tract symptoms: Secondary | ICD-10-CM | POA: Diagnosis present

## 2011-07-04 DIAGNOSIS — S98139A Complete traumatic amputation of one unspecified lesser toe, initial encounter: Principal | ICD-10-CM | POA: Diagnosis present

## 2011-07-04 DIAGNOSIS — Y93H9 Activity, other involving exterior property and land maintenance, building and construction: Secondary | ICD-10-CM

## 2011-07-04 DIAGNOSIS — Z954 Presence of other heart-valve replacement: Secondary | ICD-10-CM

## 2011-07-04 DIAGNOSIS — F0390 Unspecified dementia without behavioral disturbance: Secondary | ICD-10-CM | POA: Diagnosis present

## 2011-07-04 DIAGNOSIS — Z87891 Personal history of nicotine dependence: Secondary | ICD-10-CM

## 2011-07-04 DIAGNOSIS — E785 Hyperlipidemia, unspecified: Secondary | ICD-10-CM | POA: Diagnosis present

## 2011-07-04 DIAGNOSIS — E118 Type 2 diabetes mellitus with unspecified complications: Secondary | ICD-10-CM

## 2011-07-04 DIAGNOSIS — N138 Other obstructive and reflux uropathy: Secondary | ICD-10-CM | POA: Diagnosis present

## 2011-07-04 DIAGNOSIS — R7309 Other abnormal glucose: Secondary | ICD-10-CM | POA: Diagnosis present

## 2011-07-04 DIAGNOSIS — I10 Essential (primary) hypertension: Secondary | ICD-10-CM | POA: Diagnosis present

## 2011-07-04 DIAGNOSIS — E1165 Type 2 diabetes mellitus with hyperglycemia: Secondary | ICD-10-CM

## 2011-07-04 DIAGNOSIS — Z951 Presence of aortocoronary bypass graft: Secondary | ICD-10-CM

## 2011-07-04 DIAGNOSIS — I798 Other disorders of arteries, arterioles and capillaries in diseases classified elsewhere: Secondary | ICD-10-CM

## 2011-07-04 DIAGNOSIS — R3915 Urgency of urination: Secondary | ICD-10-CM | POA: Diagnosis present

## 2011-07-04 DIAGNOSIS — H5440 Blindness, one eye, unspecified eye: Secondary | ICD-10-CM | POA: Diagnosis present

## 2011-07-04 DIAGNOSIS — S98119A Complete traumatic amputation of unspecified great toe, initial encounter: Secondary | ICD-10-CM

## 2011-07-04 DIAGNOSIS — Z9861 Coronary angioplasty status: Secondary | ICD-10-CM

## 2011-07-04 DIAGNOSIS — S98912A Complete traumatic amputation of left foot, level unspecified, initial encounter: Secondary | ICD-10-CM

## 2011-07-04 DIAGNOSIS — G4700 Insomnia, unspecified: Secondary | ICD-10-CM | POA: Diagnosis present

## 2011-07-04 DIAGNOSIS — I739 Peripheral vascular disease, unspecified: Secondary | ICD-10-CM | POA: Diagnosis present

## 2011-07-04 DIAGNOSIS — W3189XA Contact with other specified machinery, initial encounter: Secondary | ICD-10-CM

## 2011-07-04 DIAGNOSIS — I2510 Atherosclerotic heart disease of native coronary artery without angina pectoris: Secondary | ICD-10-CM | POA: Diagnosis present

## 2011-07-04 DIAGNOSIS — M129 Arthropathy, unspecified: Secondary | ICD-10-CM | POA: Diagnosis present

## 2011-07-04 HISTORY — PX: I & D EXTREMITY: SHX5045

## 2011-07-04 HISTORY — PX: AMPUTATION: SHX166

## 2011-07-04 LAB — POCT I-STAT, CHEM 8
BUN: 18 mg/dL (ref 6–23)
Calcium, Ion: 1.22 mmol/L (ref 1.12–1.32)
Chloride: 108 mEq/L (ref 96–112)
Glucose, Bld: 110 mg/dL — ABNORMAL HIGH (ref 70–99)
Potassium: 3.8 mEq/L (ref 3.5–5.1)

## 2011-07-04 LAB — CBC
HCT: 31.8 % — ABNORMAL LOW (ref 39.0–52.0)
Hemoglobin: 10.5 g/dL — ABNORMAL LOW (ref 13.0–17.0)
MCH: 31.6 pg (ref 26.0–34.0)
MCHC: 33 g/dL (ref 30.0–36.0)
MCV: 95.8 fL (ref 78.0–100.0)

## 2011-07-04 SURGERY — IRRIGATION AND DEBRIDEMENT EXTREMITY
Anesthesia: General | Site: Foot | Laterality: Left | Wound class: Dirty or Infected

## 2011-07-04 MED ORDER — MORPHINE SULFATE 4 MG/ML IJ SOLN: 0.0500 mg/kg | INTRAMUSCULAR | Status: DC | PRN

## 2011-07-04 MED ORDER — METHOCARBAMOL 500 MG PO TABS
500.0000 mg | ORAL_TABLET | Freq: Four times a day (QID) | ORAL | Status: DC | PRN
  Administered 2011-07-04 – 2011-07-05 (×2): 500 mg via ORAL
  Filled 2011-07-04 (×2): qty 1

## 2011-07-04 MED ORDER — METOCLOPRAMIDE HCL 5 MG/ML IJ SOLN: 5.0000 mg | Freq: Three times a day (TID) | INTRAMUSCULAR | Status: DC | PRN

## 2011-07-04 MED ORDER — PHENYLEPHRINE HCL 10 MG/ML IJ SOLN
INTRAMUSCULAR | Status: DC | PRN
  Administered 2011-07-04 (×5): 80 ug via INTRAVENOUS
  Administered 2011-07-04: 160 ug via INTRAVENOUS

## 2011-07-04 MED ORDER — CEFAZOLIN SODIUM-DEXTROSE 2-3 GM-% IV SOLR
2.0000 g | Freq: Once | INTRAVENOUS | Status: AC
  Administered 2011-07-04 (×2): 2 g via INTRAVENOUS
  Filled 2011-07-04: qty 50

## 2011-07-04 MED ORDER — ATORVASTATIN CALCIUM 40 MG PO TABS
40.0000 mg | ORAL_TABLET | Freq: Every day | ORAL | Status: DC
  Administered 2011-07-04 – 2011-07-06 (×3): 40 mg via ORAL
  Filled 2011-07-04 (×3): qty 1

## 2011-07-04 MED ORDER — LACTATED RINGERS IV SOLN
INTRAVENOUS | Status: DC | PRN
  Administered 2011-07-04: 16:00:00 via INTRAVENOUS

## 2011-07-04 MED ORDER — RAMIPRIL 5 MG PO CAPS
5.0000 mg | ORAL_CAPSULE | Freq: Every day | ORAL | Status: DC
  Administered 2011-07-05 – 2011-07-06 (×2): 5 mg via ORAL
  Filled 2011-07-04 (×2): qty 1

## 2011-07-04 MED ORDER — ONDANSETRON HCL 4 MG/2ML IJ SOLN: 4.0000 mg | Freq: Four times a day (QID) | INTRAMUSCULAR | Status: DC | PRN

## 2011-07-04 MED ORDER — CILOSTAZOL 100 MG PO TABS
100.0000 mg | ORAL_TABLET | Freq: Two times a day (BID) | ORAL | Status: DC
  Administered 2011-07-04 – 2011-07-06 (×4): 100 mg via ORAL
  Filled 2011-07-04 (×5): qty 1

## 2011-07-04 MED ORDER — TAMSULOSIN HCL 0.4 MG PO CAPS
0.4000 mg | ORAL_CAPSULE | Freq: Every day | ORAL | Status: DC
Start: 2011-07-04 — End: 2011-07-06
  Administered 2011-07-04 – 2011-07-05 (×2): 0.4 mg via ORAL
  Filled 2011-07-04 (×3): qty 1

## 2011-07-04 MED ORDER — POLYETHYLENE GLYCOL 3350 17 G PO PACK: 17.0000 g | PACK | Freq: Every day | ORAL | Status: DC | PRN

## 2011-07-04 MED ORDER — BISACODYL 10 MG RE SUPP: 10.0000 mg | Freq: Every day | RECTAL | Status: DC | PRN

## 2011-07-04 MED ORDER — FESOTERODINE FUMARATE ER 4 MG PO TB24
4.0000 mg | ORAL_TABLET | Freq: Every day | ORAL | Status: DC
  Administered 2011-07-04 – 2011-07-05 (×2): 4 mg via ORAL
  Filled 2011-07-04 (×3): qty 1

## 2011-07-04 MED ORDER — FENTANYL CITRATE 0.05 MG/ML IJ SOLN
INTRAMUSCULAR | Status: DC | PRN
Start: 2011-07-04 — End: 2011-07-04
  Administered 2011-07-04: 150 ug via INTRAVENOUS

## 2011-07-04 MED ORDER — ZOLPIDEM TARTRATE 10 MG PO TABS
10.0000 mg | ORAL_TABLET | Freq: Every evening | ORAL | Status: DC | PRN
  Administered 2011-07-05 (×2): 10 mg via ORAL
  Filled 2011-07-04 (×2): qty 1

## 2011-07-04 MED ORDER — METOPROLOL SUCCINATE ER 25 MG PO TB24
37.5000 mg | ORAL_TABLET | Freq: Two times a day (BID) | ORAL | Status: DC
  Administered 2011-07-04 – 2011-07-06 (×4): 37.5 mg via ORAL
  Filled 2011-07-04 (×5): qty 1

## 2011-07-04 MED ORDER — SUCCINYLCHOLINE CHLORIDE 20 MG/ML IJ SOLN
INTRAMUSCULAR | Status: DC | PRN
  Administered 2011-07-04: 120 mg via INTRAVENOUS

## 2011-07-04 MED ORDER — HYDROCODONE-ACETAMINOPHEN 5-325 MG PO TABS
1.0000 | ORAL_TABLET | ORAL | Status: DC | PRN
  Administered 2011-07-04 – 2011-07-06 (×3): 2 via ORAL
  Filled 2011-07-04 (×3): qty 2

## 2011-07-04 MED ORDER — LIDOCAINE HCL (CARDIAC) 20 MG/ML IV SOLN
INTRAVENOUS | Status: DC | PRN
  Administered 2011-07-04: 100 mg via INTRAVENOUS

## 2011-07-04 MED ORDER — DOXYCYCLINE HYCLATE 100 MG PO TABS
100.0000 mg | ORAL_TABLET | Freq: Two times a day (BID) | ORAL | Status: DC
  Administered 2011-07-04 – 2011-07-06 (×4): 100 mg via ORAL
  Filled 2011-07-04 (×5): qty 1

## 2011-07-04 MED ORDER — ONDANSETRON HCL 4 MG/2ML IJ SOLN: 4.0000 mg | Freq: Once | INTRAMUSCULAR | Status: DC | PRN

## 2011-07-04 MED ORDER — INSULIN ASPART 100 UNIT/ML ~~LOC~~ SOLN
0.0000 [IU] | Freq: Three times a day (TID) | SUBCUTANEOUS | Status: DC
  Administered 2011-07-05: 1 [IU] via SUBCUTANEOUS
  Administered 2011-07-05: 2 [IU] via SUBCUTANEOUS
  Administered 2011-07-06: 1 [IU] via SUBCUTANEOUS

## 2011-07-04 MED ORDER — ONDANSETRON HCL 4 MG/2ML IJ SOLN
INTRAMUSCULAR | Status: DC | PRN
  Administered 2011-07-04: 4 mg via INTRAVENOUS

## 2011-07-04 MED ORDER — METOCLOPRAMIDE HCL 10 MG PO TABS: 5.0000 mg | ORAL_TABLET | Freq: Three times a day (TID) | ORAL | Status: DC | PRN

## 2011-07-04 MED ORDER — MORPHINE SULFATE 2 MG/ML IJ SOLN
1.0000 mg | INTRAMUSCULAR | Status: DC | PRN
  Administered 2011-07-05: 1 mg via INTRAVENOUS
  Filled 2011-07-04: qty 1

## 2011-07-04 MED ORDER — AMLODIPINE BESYLATE 5 MG PO TABS
5.0000 mg | ORAL_TABLET | Freq: Every day | ORAL | Status: DC
  Administered 2011-07-05: 5 mg via ORAL
  Filled 2011-07-04: qty 1

## 2011-07-04 MED ORDER — HYDROMORPHONE HCL PF 1 MG/ML IJ SOLN
0.2500 mg | INTRAMUSCULAR | Status: DC | PRN
  Administered 2011-07-04 (×2): 0.5 mg via INTRAVENOUS

## 2011-07-04 MED ORDER — METHOCARBAMOL 100 MG/ML IJ SOLN
500.0000 mg | Freq: Four times a day (QID) | INTRAVENOUS | Status: DC | PRN
  Filled 2011-07-04: qty 5

## 2011-07-04 MED ORDER — DONEPEZIL HCL 10 MG PO TABS
10.0000 mg | ORAL_TABLET | Freq: Every day | ORAL | Status: DC
  Administered 2011-07-04 – 2011-07-05 (×2): 10 mg via ORAL
  Filled 2011-07-04 (×3): qty 1

## 2011-07-04 MED ORDER — EZETIMIBE 10 MG PO TABS
10.0000 mg | ORAL_TABLET | Freq: Every day | ORAL | Status: DC
  Administered 2011-07-05: 10 mg via ORAL
  Filled 2011-07-04 (×3): qty 1

## 2011-07-04 MED ORDER — RAMIPRIL 5 MG PO TABS: 5.0000 mg | ORAL_TABLET | Freq: Every day | ORAL | Status: DC

## 2011-07-04 MED ORDER — SODIUM CHLORIDE 0.9 % IV SOLN
INTRAVENOUS | Status: DC
  Administered 2011-07-04: 999 mL via INTRAVENOUS

## 2011-07-04 MED ORDER — PROPOFOL 10 MG/ML IV EMUL
INTRAVENOUS | Status: DC | PRN
  Administered 2011-07-04: 100 mg via INTRAVENOUS

## 2011-07-04 MED ORDER — SODIUM CHLORIDE 0.9 % IV SOLN
INTRAVENOUS | Status: DC
Start: 2011-07-04 — End: 2011-07-06
  Administered 2011-07-04: 23:00:00 via INTRAVENOUS

## 2011-07-04 MED ORDER — ONDANSETRON HCL 4 MG PO TABS: 4.0000 mg | ORAL_TABLET | Freq: Four times a day (QID) | ORAL | Status: DC | PRN

## 2011-07-04 MED ORDER — FLEET ENEMA 7-19 GM/118ML RE ENEM: 1.0000 | ENEMA | Freq: Once | RECTAL | Status: AC | PRN

## 2011-07-04 MED ORDER — ENOXAPARIN SODIUM 40 MG/0.4ML ~~LOC~~ SOLN
40.0000 mg | SUBCUTANEOUS | Status: DC
  Administered 2011-07-05 – 2011-07-06 (×2): 40 mg via SUBCUTANEOUS
  Filled 2011-07-04 (×3): qty 0.4

## 2011-07-04 MED ORDER — DOCUSATE SODIUM 100 MG PO CAPS
100.0000 mg | ORAL_CAPSULE | Freq: Two times a day (BID) | ORAL | Status: DC
  Administered 2011-07-04 – 2011-07-06 (×4): 100 mg via ORAL
  Filled 2011-07-04 (×5): qty 1

## 2011-07-04 MED ORDER — CEFAZOLIN SODIUM 1-5 GM-% IV SOLN
1.0000 g | Freq: Four times a day (QID) | INTRAVENOUS | Status: DC
  Administered 2011-07-04 – 2011-07-06 (×7): 1 g via INTRAVENOUS
  Filled 2011-07-04 (×10): qty 50

## 2011-07-04 MED ORDER — MORPHINE SULFATE 4 MG/ML IJ SOLN
4.0000 mg | Freq: Once | INTRAMUSCULAR | Status: AC
  Administered 2011-07-04: 4 mg via INTRAVENOUS
  Filled 2011-07-04: qty 1

## 2011-07-04 MED ORDER — SODIUM CHLORIDE 0.9 % IR SOLN
Status: DC | PRN
  Administered 2011-07-04: 1000 mL

## 2011-07-04 MED ORDER — NITROGLYCERIN 0.4 MG SL SUBL
0.4000 mg | SUBLINGUAL_TABLET | SUBLINGUAL | Status: DC | PRN
Start: 2011-07-04 — End: 2011-07-06

## 2011-07-04 SURGICAL SUPPLY — 56 items
BAG DECANTER FOR FLEXI CONT (MISCELLANEOUS) ×2 IMPLANT
BANDAGE ELASTIC 4 VELCRO ST LF (GAUZE/BANDAGES/DRESSINGS) ×1 IMPLANT
BANDAGE ELASTIC 6 VELCRO ST LF (GAUZE/BANDAGES/DRESSINGS) ×2 IMPLANT
BANDAGE ESMARK 6X9 LF (GAUZE/BANDAGES/DRESSINGS) ×1 IMPLANT
BANDAGE GAUZE ELAST BULKY 4 IN (GAUZE/BANDAGES/DRESSINGS) ×2 IMPLANT
BLADE CORE FAN STRYKER (BLADE) ×1 IMPLANT
BNDG CMPR 9X6 STRL LF SNTH (GAUZE/BANDAGES/DRESSINGS) ×1
BNDG ESMARK 6X9 LF (GAUZE/BANDAGES/DRESSINGS) ×2
CLOTH BEACON ORANGE TIMEOUT ST (SAFETY) ×2 IMPLANT
COVER SURGICAL LIGHT HANDLE (MISCELLANEOUS) ×2 IMPLANT
CUFF TOURNIQUET SINGLE 18IN (TOURNIQUET CUFF) IMPLANT
CUFF TOURNIQUET SINGLE 24IN (TOURNIQUET CUFF) IMPLANT
CUFF TOURNIQUET SINGLE 34IN LL (TOURNIQUET CUFF) IMPLANT
CUFF TOURNIQUET SINGLE 44IN (TOURNIQUET CUFF) IMPLANT
DRAPE U-SHAPE 47X51 STRL (DRAPES) ×1 IMPLANT
DRSG ADAPTIC 3X8 NADH LF (GAUZE/BANDAGES/DRESSINGS) ×1 IMPLANT
DRSG PAD ABDOMINAL 8X10 ST (GAUZE/BANDAGES/DRESSINGS) ×4 IMPLANT
DURAPREP 26ML APPLICATOR (WOUND CARE) ×2 IMPLANT
ELECT REM PT RETURN 9FT ADLT (ELECTROSURGICAL) ×2
ELECTRODE REM PT RTRN 9FT ADLT (ELECTROSURGICAL) ×1 IMPLANT
FACESHIELD LNG OPTICON STERILE (SAFETY) ×2 IMPLANT
GLOVE BIO SURGEON STRL SZ7.5 (GLOVE) ×2 IMPLANT
GLOVE BIO SURGEON STRL SZ8 (GLOVE) ×4 IMPLANT
GLOVE BIOGEL M 8.0 STRL (GLOVE) ×2 IMPLANT
GLOVE BIOGEL PI IND STRL 7.0 (GLOVE) IMPLANT
GLOVE BIOGEL PI IND STRL 7.5 (GLOVE) IMPLANT
GLOVE BIOGEL PI IND STRL 8 (GLOVE) ×1 IMPLANT
GLOVE BIOGEL PI INDICATOR 7.0 (GLOVE) ×1
GLOVE BIOGEL PI INDICATOR 7.5 (GLOVE) ×1
GLOVE BIOGEL PI INDICATOR 8 (GLOVE) ×2
GOWN STRL NON-REIN LRG LVL3 (GOWN DISPOSABLE) ×6 IMPLANT
HANDPIECE INTERPULSE COAX TIP (DISPOSABLE) ×2
KIT BASIN OR (CUSTOM PROCEDURE TRAY) ×2 IMPLANT
KIT ROOM TURNOVER OR (KITS) ×2 IMPLANT
MANIFOLD NEPTUNE II (INSTRUMENTS) ×2 IMPLANT
NS IRRIG 1000ML POUR BTL (IV SOLUTION) ×2 IMPLANT
PACK ORTHO EXTREMITY (CUSTOM PROCEDURE TRAY) ×2 IMPLANT
PAD ARMBOARD 7.5X6 YLW CONV (MISCELLANEOUS) ×4 IMPLANT
PAD CAST 4YDX4 CTTN HI CHSV (CAST SUPPLIES) ×1 IMPLANT
PADDING CAST COTTON 4X4 STRL (CAST SUPPLIES)
SET HNDPC FAN SPRY TIP SCT (DISPOSABLE) ×1 IMPLANT
SPONGE GAUZE 4X4 12PLY (GAUZE/BANDAGES/DRESSINGS) ×2 IMPLANT
SPONGE LAP 18X18 X RAY DECT (DISPOSABLE) ×2 IMPLANT
SPONGE LAP 4X18 X RAY DECT (DISPOSABLE) ×3 IMPLANT
STAPLER VISISTAT 35W (STAPLE) ×2 IMPLANT
SUCTION FRAZIER TIP 10 FR DISP (SUCTIONS) ×2 IMPLANT
SUT ETHILON 4 0 FS 1 (SUTURE) ×3 IMPLANT
SUT VIC AB 2-0 CT1 27 (SUTURE) ×4
SUT VIC AB 2-0 CT1 TAPERPNT 27 (SUTURE) IMPLANT
SYSTEM SURGIPACK (MISCELLANEOUS) ×2 IMPLANT
TOWEL OR 17X24 6PK STRL BLUE (TOWEL DISPOSABLE) ×2 IMPLANT
TOWEL OR 17X26 10 PK STRL BLUE (TOWEL DISPOSABLE) ×2 IMPLANT
TUBE ANAEROBIC SPECIMEN COL (MISCELLANEOUS) IMPLANT
TUBE CONNECTING 12X1/4 (SUCTIONS) ×2 IMPLANT
WATER STERILE IRR 1000ML POUR (IV SOLUTION) ×2 IMPLANT
YANKAUER SUCT BULB TIP NO VENT (SUCTIONS) ×2 IMPLANT

## 2011-07-04 NOTE — Brief Op Note (Signed)
07/04/2011  4:54 PM  PATIENT:  [name].  78 y.o. male  PRE-OPERATIVE DIAGNOSIS:  Traumatic Amputation Left Foot  POST-OPERATIVE DIAGNOSIS:  Traumatic Amputation Left Foot  PROCEDURE:  Procedure(s) (LRB): IRRIGATION AND DEBRIDEMENT EXTREMITY (Left) With revision amputation left foot  SURGEON:  Surgeon(s) and Role:    * Loanne Drilling, MD - Primary  PHYSICIAN ASSISTANT:   ASSISTANTS: Avel Peace, PA-C   ANESTHESIA:   general  EBL:  Total I/O In: 700 [I.V.:700] Out: -   BLOOD ADMINISTERED:none  DRAINS: none   LOCAL MEDICATIONS USED:  NONE  DICTATION: .Other Dictation: Dictation Number [id number]  PLAN OF CARE: Admit to inpatient   PATIENT DISPOSITION:  PACU - hemodynamically stable.  Gus Rankin Baby Stairs, MD    07/04/2011, 4:59 PM

## 2011-07-04 NOTE — ED Notes (Signed)
Subjective: [name] was mowing his lawn earlier his afternoon and when he stopped, he felt as though he turned it off, but his left foot got caught under the blade with immediate pain and bleeding. The blade cut through his boot and amputated his great toe. He was rushed immediately to the ED where evaluation confirmed the amputation and we were subsequently consulted. The patient has had a recent vascular bypass grafting by Dr. Darrick Penna and is currently on doxycycline Due to a post-op infection which also required revision of part of the graft. He did not sustain any other injuries in the accident today.  Objective: Vital signs in last 24 hours: Temp:  [98.5 F (36.9 C)] 98.5 F (36.9 C) (05/10 1338) Pulse Rate:  [68-92] 83  (05/10 1455) Resp:  [15-26] 20  (05/10 1455) BP: (92-105)/(57-69) 105/69 mmHg (05/10 1455) SpO2:  [95 %-99 %] 99 % (05/10 1455)  Intake/Output from previous day:   Intake/Output this shift:     Basename 07/04/11 1420 07/04/11 1403  HGB 10.9* 10.5*    Basename 07/04/11 1420 07/04/11 1403  WBC -- 3.2*  RBC -- 3.32*  HCT 32.0* 31.8*  PLT -- 98*    Basename 07/04/11 1420  NA 143  K 3.8  CL 108  CO2 --  BUN 18  CREATININE 1.40*  GLUCOSE 110*  CALCIUM --   No results found for this basename: LABPT:2,INR:2 in the last 72 hours  Physical Examination: General appearance - alert, well appearing, and in no distress Mental status - alert, oriented to person, place, and time Extremities -Great toe is amputated left foot just below MTP joint. There are bony fragments of the distal metatarsal within the wound. The surrounding skin appears viable. Questionable viability of the second toe.  There is a small amount of contamination at the distal part of the wound but no soil or gross contamination. Pulse is not palpable but capillary refill is intact in forefoot with exception of second toe. Skin -Loss of dorsal skin overlying the amputation bed. Plantar skin and  tissues are intact.  Radiographs show comminuted fracture of the distal 1st metatarsal. Great toe is not present on films.   Assessment/Plan: Left foot/great toe lawnmower injury with partial amputation- Will require urgent I & D due to contamination from the lawnmower with open wound and open fracture. Plan to OR now for I & D and revision amputation. Will do primary closure if there is a clean wound with minimal contamination vs leaving it open with return in a few days for repeat I & D and closure if grossly contaminated today. Exam in ED shows minimal contamination so I am hopeful we can do this with one procedure. Discussed possibility of healing problems due to vascular disease. Discussed procedure risks and potential comps with patient and family and they elect to proceed.  Mark Rankin [name], MD    07/04/2011, 3:35 PM    Loanne Drilling 07/04/2011, 3:22 PM

## 2011-07-04 NOTE — ED Notes (Signed)
Pt here for laceration to left foot, left great toe amputated, pt denies pain, sts he was stepping off lawn mower and caught his foot in the blade.

## 2011-07-04 NOTE — ED Provider Notes (Signed)
History     CSN: [id number]  Arrival date & time 07/04/11  1334   First MD Initiated Contact with Patient 07/04/11 1340      Chief Complaint  Patient presents with  . Laceration    (Consider location/radiation/quality/duration/timing/severity/associated sxs/prior treatment) HPI Patient injured his left foot with a riding a riding lawnmower no other injury complains of the vagu discomfort at left foot. No other injury no treatment prior to coming here. Nothing makes symptoms better or worse Past Medical History  Diagnosis Date  . Hyperlipidemia     takes Atorvastatin daily  . CAD (coronary artery disease)   . Gastritis   . BPH (benign prostatic hypertrophy)     Dr.Nesi is urologist  . Arthritis   . Claudication   . Hypertension     takes Ramipril and Metoprolol daily  . Heart murmur   . Peripheral vascular disease   . Asthma     as a young child  . Bruises easily     takes Pletal and ASA daily  . Colitis     hx of  . Hx of colonic polyps   . Urinary urgency     takes Flomax daily  . Diabetes mellitus     borderline diabetic  . Insomnia     takes Ambien nightly  . History of shingles 3-96yrs ago  . Blind right eye     "since age 25-4; S/P whooping cough"  . Mental disorder     takes Aricept daily  . DEMENTIA     Past Surgical History  Procedure Date  . Colonoscopy   . Axillary-femoral bypass graft 05/05/2011    Procedure: BYPASS GRAFT AXILLA-BIFEMORAL;  Surgeon: Sherren Kerns, MD;  Location: [facility] OR;  Service: Vascular;  Laterality: Right;  . Femoral-popliteal bypass graft 05/05/2011    Procedure: BYPASS GRAFT FEMORAL-POPLITEAL ARTERY;  Surgeon: Sherren Kerns, MD;  Location: [facility] OR;  Service: Vascular;  Laterality: Right;  . Tonsillectomy 1960  . Aortic valve replacement 06/2003    /E-chart  . Band hemorrhoidectomy 1960's  . Ostectomy 09/2001    right hip/E-chart  . Cardiac catheterization     multiple-see epic  . Coronary angioplasty     right leg  .  Coronary angioplasty with stent placement 2000    /E-chart  . Coronary artery bypass graft 1989/06/2003    CABG X 5; CABG X1  . Cardiac valve replacement 06/2003  . Cataract extraction     left eye  . Axillary-femoral bypass graft 06/10/2011    Procedure: BYPASS GRAFT AXILLA-BIFEMORAL;  Surgeon: Sherren Kerns, MD;  Location: [facility] OR;  Service: Vascular;  Laterality: Right;  REVISION Right axillary bifemoral bypass using a 8mm x 80 cm Propaten Graft    Family History  Problem Relation Age of Onset  . Heart disease Father   . Anesthesia problems Neg Hx   . Hypotension Neg Hx   . Malignant hyperthermia Neg Hx   . Pseudochol deficiency Neg Hx     History  Substance Use Topics  . Smoking status: Former Smoker    Types: Cigarettes    Quit date: 02/25/1940  . Smokeless tobacco: Never Used  . Alcohol Use: Yes     "last alcohol was ~ 1950's"      Review of Systems  Constitutional: Negative.   HENT: Negative.   Respiratory: Negative.   Cardiovascular: Negative.   Gastrointestinal: Negative.   Musculoskeletal: Negative.   Skin: Positive for wound.  Neurological: Negative.  Hematological: Negative.   Psychiatric/Behavioral: Negative.   All other systems reviewed and are negative.    Allergies  Shrimp  Home Medications   Current Outpatient Rx  Name Route Sig Dispense Refill  . ASPIRIN EC 325 MG PO TBEC Oral Take 325 mg by mouth daily.    . ATORVASTATIN CALCIUM 40 MG PO TABS Oral Take 40 mg by mouth daily.    Marland Kitchen CILOSTAZOL 100 MG PO TABS Oral Take 100 mg by mouth 2 (two) times daily.     . DONEPEZIL HCL 10 MG PO TABS Oral Take 10 mg by mouth daily.    Marland Kitchen DOXYCYCLINE HYCLATE 100 MG PO TABS Oral Take 100 mg by mouth Twice daily.    . ERGOCALCIFEROL 50000 UNITS PO CAPS Oral Take 50,000 Units by mouth once a week.    Marland Kitchen EZETIMIBE 10 MG PO TABS Oral Take 10 mg by mouth at bedtime.      . FESOTERODINE FUMARATE ER 4 MG PO TB24 Oral Take 4 mg by mouth daily.      Marland Kitchen METOPROLOL  SUCCINATE ER 25 MG PO TB24 Oral Take 37.5 mg by mouth 2 (two) times daily.     . MULTIVITAMINS PO TABS Oral Take 1 tablet by mouth daily.    Marland Kitchen NITROGLYCERIN 0.4 MG SL SUBL Sublingual Place 0.4 mg under the tongue every 5 (five) minutes as needed. For chest pain    . OXYCODONE-ACETAMINOPHEN 5-325 MG PO TABS Oral Take 1 tablet by mouth every 4 (four) hours as needed for pain. For pain. 30 tablet 0  . PROBIOTIC FORMULA PO CAPS Oral Take 356 each by mouth daily.    Marland Kitchen RAMIPRIL 5 MG PO TABS Oral Take 5 mg by mouth daily.      . SULFAMETHOXAZOLE-TMP DS 800-160 MG PO TABS Oral Take 1 tablet by mouth 2 (two) times daily.    Marland Kitchen TAMSULOSIN HCL 0.4 MG PO CAPS Oral Take 0.4 mg by mouth at bedtime.     Marland Kitchen VITAMIN C 500 MG PO TABS Oral Take 500 mg by mouth 2 (two) times daily.    Marland Kitchen VITAMIN D (ERGOCALCIFEROL) 50000 UNITS PO CAPS      . ZOLPIDEM TARTRATE 10 MG PO TABS Oral Take 10 mg by mouth at bedtime as needed. For sleep      BP 94/57  Pulse 92  Temp(Src) 98.5 F (36.9 C) (Oral)  Resp 16  SpO2 95%  Physical Exam  Nursing note and vitals reviewed. Constitutional: He appears well-developed and well-nourished.  HENT:  Head: Normocephalic and atraumatic.  Eyes: Conjunctivae are normal. Pupils are equal, round, and reactive to light.  Neck: Neck supple. No tracheal deviation present. No thyromegaly present.  Cardiovascular: Normal rate and regular rhythm.   No murmur heard. Pulmonary/Chest: Effort normal and breath sounds normal.  Abdominal: Soft. Bowel sounds are normal. He exhibits no distension. There is no tenderness.  Musculoskeletal: Normal range of motion. He exhibits no edema and no tenderness.       Left lower extremity amputated at the distal third of the foot, medially DP and PT pulses absent bilaterally. Minimal active bleeding  Neurological: He is alert. Coordination normal.  Skin: Skin is warm and dry. No rash noted.  Psychiatric: He has a normal mood and affect.    ED Course    Procedures (including critical care time) Declines pain medicine  Labs Reviewed - No data to display No results found.  Date: 07/04/2011  Rate: 85  Rhythm: normal sinus  rhythm  QRS Axis: left  Intervals: normal  ST/T Wave abnormalities: nonspecific ST/T changes  Conduction Disutrbances:right bundle branch block  Narrative Interpretation:   Old EKG Reviewed: unchanged  No significant change from 06/06/2011 No diagnosis found. Results for orders placed during the hospital encounter of 07/04/11  CBC      Component Value Range   WBC 3.2 (*) 4.0 - 10.5 (K/uL)   RBC 3.32 (*) 4.22 - 5.81 (MIL/uL)   Hemoglobin 10.5 (*) 13.0 - 17.0 (g/dL)   HCT 40.9 (*) 81.1 - 52.0 (%)   MCV 95.8  78.0 - 100.0 (fL)   MCH 31.6  26.0 - 34.0 (pg)   MCHC 33.0  30.0 - 36.0 (g/dL)   RDW 91.4 (*) 78.2 - 15.5 (%)   Platelets PENDING  150 - 400 (K/uL)  POCT I-STAT, CHEM 8      Component Value Range   Sodium 143  135 - 145 (mEq/L)   Potassium 3.8  3.5 - 5.1 (mEq/L)   Chloride 108  96 - 112 (mEq/L)   BUN 18  6 - 23 (mg/dL)   Creatinine, Ser 9.56 (*) 0.50 - 1.35 (mg/dL)   Glucose, Bld 213 (*) 70 - 99 (mg/dL)   Calcium, Ion 0.86  5.78 - 1.32 (mmol/L)   TCO2 26  0 - 100 (mmol/L)   Hemoglobin 10.9 (*) 13.0 - 17.0 (g/dL)   HCT 46.9 (*) 62.9 - 52.0 (%)   Dg Chest 2 View  06/05/2011  *RADIOLOGY REPORT*  Clinical Data: Preoperative.  CHEST - 2 VIEW  Comparison: 05/08/2011.  Findings: Cardiomegaly.  Bioprosthetic aortic valve.  Tortuous thoracic aorta with aortic atherosclerosis.  Old granulomatous disease is present with calcifications in the right upper lobe. Basilar atelectasis, greater on the left than right.  No airspace disease or effusion.  IMPRESSION: Cardiomegaly and postoperative changes of aortic valve replacement. Old granulomatous disease.  No acute cardiopulmonary disease.  Original Report Authenticated By: Andreas Newport, M.D.   Dg Chest Port 1 View  07/04/2011  *RADIOLOGY REPORT*  Clinical Data:  Laceration to but from lawnmower  PORTABLE CHEST - 1 VIEW  Comparison: 06/05/2011  Findings: Borderline cardiomegaly.  Pulmonary vascularity is within normal limits.  Lungs are clear.  No pneumothorax.  No acute bony deformity.  IMPRESSION: Borderline cardiomegaly without edema.  Original Report Authenticated By: Donavan Burnet, M.D.   Dg Foot Complete Left  07/04/2011  *RADIOLOGY REPORT*  Clinical Data: Laceration from lawnmower  LEFT FOOT - COMPLETE 3+ VIEW  Comparison: None.  Findings: The great toe has been amputated.  The tip of the first metatarsal is fractured and fragmented.  Bone fragments of the toe project over the soft tissues.  IMPRESSION: Great toe amputation and distal first metatarsal fracture.  Original Report Authenticated By: Donavan Burnet, M.D.    Spoke with Dr.Aluisio, will admit patient for surgery  MDM  Ancef 2 g IV ordered in preparation for surgery Diagnosis partial amputation of left foot  Old records reviewed . soke with family and pt to advise of need for surgery CRITICAL CARE Performed by: Doug Sou   Total critical care time: 30 minute  Critical care time was exclusive of separately billable procedures and treating other patients.  Critical care was necessary to treat or prevent imminent or life-threatening deterioration.  Critical care was time spent personally by me on the following activities: development of treatment plan with patient and/or surrogate as well as nursing, discussions with consultants, evaluation of patient's response to treatment, examination  of patient, obtaining history from patient or surrogate, ordering and performing treatments and interventions, ordering and review of laboratory studies, ordering and review of radiographic studies, pulse oximetry and re-evaluation of patient's condition.     Doug Sou, MD 07/04/11 [id number]

## 2011-07-04 NOTE — ED Notes (Addendum)
ECG given to Dr. Jacubowitz with older copy  

## 2011-07-04 NOTE — Preoperative (Signed)
Beta Blockers   Reason not to administer Beta Blockers:Not Applicable 

## 2011-07-04 NOTE — Anesthesia Procedure Notes (Signed)
Procedure Name: Intubation Date/Time: 07/04/2011 4:16 PM Performed by: Jerilee Hoh Pre-anesthesia Checklist: Patient identified, Emergency Drugs available, Suction available and Patient being monitored Patient Re-evaluated:Patient Re-evaluated prior to inductionOxygen Delivery Method: Circle system utilized Preoxygenation: Pre-oxygenation with 100% oxygen Intubation Type: IV induction, Cricoid Pressure applied and Rapid sequence Laryngoscope Size: Mac and 4 Grade View: Grade III Tube type: Oral Tube size: 7.5 mm Number of attempts: 1 Airway Equipment and Method: Stylet Placement Confirmation: ETT inserted through vocal cords under direct vision,  positive ETCO2 and breath sounds checked- equal and bilateral Secured at: 23 cm Tube secured with: Tape Dental Injury: Teeth and Oropharynx as per pre-operative assessment

## 2011-07-04 NOTE — ED Notes (Signed)
Family at bedside. Pt requested pain meds.

## 2011-07-04 NOTE — ED Notes (Signed)
Patient's daughter called back will come as soon as she can.

## 2011-07-04 NOTE — ED Notes (Signed)
Patient 's son was called at patient request [phone] home and cell [phone], no answer message. Daughter in law called Lyn [phone] no answer message left. Patients had rubber boots on left rubber boot cut off  Right rubber boot placed in belonging bag. Tan pants with [employer] cellphone and keys in right front pocket of pants , patient has with him. Pr. Light blue socks t-shirt taken off placed in belonging bag. Wife at bedside.

## 2011-07-04 NOTE — H&P (Signed)
[name]. is an 78 y.o. male.    Chief Complaint: Left foot laceration  HPI: Patient is an 78 yo male who sustained a laceration to the left great toe with amputation from a  lawmower accident.  He was cutting the grass earlier today and step down and unfortunately his foot got to close to the blade causing a traumatic amputation to the left great toe.  He was brought to [facility] ED where he was found to have a traumatic amputation of the left great toe.  Dr. Lequita Halt was on call and evaluated the patient and it was decided that the patient needed to be admitted to the hospital for emergent surgical debridement and washout of the foot.  Past Medical History  Diagnosis Date  . Hyperlipidemia     takes Atorvastatin daily  . CAD (coronary artery disease)   . Gastritis   . BPH (benign prostatic hypertrophy)     Dr.Nesi is urologist  . Arthritis   . Claudication   . Hypertension     takes Ramipril and Metoprolol daily  . Heart murmur   . Peripheral vascular disease   . Asthma     as a young child  . Bruises easily     takes Pletal and ASA daily  . Colitis     hx of  . Hx of colonic polyps   . Urinary urgency     takes Flomax daily  . Diabetes mellitus     borderline diabetic  . Insomnia     takes Ambien nightly  . History of shingles 3-65yrs ago  . Blind right eye     "since age 77-4; S/P whooping cough"  . Mental disorder     takes Aricept daily  . DEMENTIA     Past Surgical History  Procedure Date  . Colonoscopy   . Axillary-femoral bypass graft 05/05/2011    Procedure: BYPASS GRAFT AXILLA-BIFEMORAL;  Surgeon: Sherren Kerns, MD;  Location: [facility] OR;  Service: Vascular;  Laterality: Right;  . Femoral-popliteal bypass graft 05/05/2011    Procedure: BYPASS GRAFT FEMORAL-POPLITEAL ARTERY;  Surgeon: Sherren Kerns, MD;  Location: [facility] OR;  Service: Vascular;  Laterality: Right;  . Tonsillectomy 1960  . Aortic valve replacement 06/2003    /E-chart  . Band hemorrhoidectomy  1960's  . Ostectomy 09/2001    right hip/E-chart  . Cardiac catheterization     multiple-see epic  . Coronary angioplasty     right leg  . Coronary angioplasty with stent placement 2000    /E-chart  . Coronary artery bypass graft 1989/06/2003    CABG X 5; CABG X1  . Cardiac valve replacement 06/2003  . Cataract extraction     left eye  . Axillary-femoral bypass graft 06/10/2011    Procedure: BYPASS GRAFT AXILLA-BIFEMORAL;  Surgeon: Sherren Kerns, MD;  Location: [facility] OR;  Service: Vascular;  Laterality: Right;  REVISION Right axillary bifemoral bypass using a 8mm x 80 cm Propaten Graft    Family History  Problem Relation Age of Onset  . Heart disease Father   . Anesthesia problems Neg Hx   . Hypotension Neg Hx   . Malignant hyperthermia Neg Hx   . Pseudochol deficiency Neg Hx    Social History:  reports that he quit smoking about 71 years ago. His smoking use included Cigarettes. He has never used smokeless tobacco. He reports that he drinks alcohol. He reports that he does not use illicit drugs.  Allergies:  Allergies  Allergen Reactions  . Shrimp (Shellfish Allergy) Nausea And Vomiting    "haven't eaten any since 1980's" pt only allergic to shrimp. Not shellfish     (Not in a hospital admission)  Results for orders placed during the hospital encounter of 07/04/11 (from the past 48 hour(s))  CBC     Status: Abnormal   Collection Time   07/04/11  2:03 PM      Component Value Range Comment   WBC 3.2 (*) 4.0 - 10.5 (K/uL)    RBC 3.32 (*) 4.22 - 5.81 (MIL/uL)    Hemoglobin 10.5 (*) 13.0 - 17.0 (g/dL)    HCT 86.7 (*) 61.9 - 52.0 (%)    MCV 95.8  78.0 - 100.0 (fL)    MCH 31.6  26.0 - 34.0 (pg)    MCHC 33.0  30.0 - 36.0 (g/dL)    RDW 50.9 (*) 32.6 - 15.5 (%)    Platelets 98 (*) 150 - 400 (K/uL) PLATELET COUNT CONFIRMED BY SMEAR  POCT I-STAT, CHEM 8     Status: Abnormal   Collection Time   07/04/11  2:20 PM      Component Value Range Comment   Sodium 143  135 - 145 (mEq/L)     Potassium 3.8  3.5 - 5.1 (mEq/L)    Chloride 108  96 - 112 (mEq/L)    BUN 18  6 - 23 (mg/dL)    Creatinine, Ser 7.12 (*) 0.50 - 1.35 (mg/dL)    Glucose, Bld 458 (*) 70 - 99 (mg/dL)    Calcium, Ion 0.99  1.12 - 1.32 (mmol/L)    TCO2 26  0 - 100 (mmol/L)    Hemoglobin 10.9 (*) 13.0 - 17.0 (g/dL)    HCT 83.3 (*) 82.5 - 52.0 (%)    Dg Chest Port 1 View  07/04/2011  *RADIOLOGY REPORT*  Clinical Data: Laceration to but from lawnmower  PORTABLE CHEST - 1 VIEW  Comparison: 06/05/2011  Findings: Borderline cardiomegaly.  Pulmonary vascularity is within normal limits.  Lungs are clear.  No pneumothorax.  No acute bony deformity.  IMPRESSION: Borderline cardiomegaly without edema.  Original Report Authenticated By: Donavan Burnet, M.D.   Dg Foot Complete Left  07/04/2011  *RADIOLOGY REPORT*  Clinical Data: Laceration from lawnmower  LEFT FOOT - COMPLETE 3+ VIEW  Comparison: None.  Findings: The great toe has been amputated.  The tip of the first metatarsal is fractured and fragmented.  Bone fragments of the toe project over the soft tissues.  IMPRESSION: Great toe amputation and distal first metatarsal fracture.  Original Report Authenticated By: Donavan Burnet, M.D.    Review of Systems  Constitutional: Negative.   HENT: Negative.   Respiratory: Negative.   Cardiovascular: Negative.   Gastrointestinal: Negative.   Genitourinary: Negative.   Musculoskeletal:       Traumatic injury and laceration to the left foot and great toe.    Blood pressure 105/69, pulse 83, temperature 98.5 F (36.9 C), temperature source Oral, resp. rate 20, SpO2 99.00%. Physical Exam  Constitutional: He appears well-developed and well-nourished.  HENT:  Head: Normocephalic and atraumatic.  Eyes: EOM are normal.  Neck: Neck supple.  Cardiovascular: Regular rhythm.   GI: Soft.  Musculoskeletal:       Feet:     Assessment/Plan Left Great Toe Traumatic Amputation  Patient seen in the [facility] ED by Dr. Lequita Halt. He  will be admitted to [facility] ED for washout and debridement of the left foot.  He will be  admitted to the hospital for IV antibiotics for at least 48 hours following the procedure.  Perry, Mark 07/04/2011, 3:17 PM

## 2011-07-04 NOTE — Consult Note (Signed)
[facility] Medical Consultation  [name]. ZOX:[id number] DOB: 1924-11-15 DOA: 07/04/2011 PCP: Dorrene German, MD, MD   Requesting physician: Joya Gaskins  Date of consultation: 07/04/11  Reason for consultation: For Medical Management perioperatively.  Recommendations: 1. Diabetes Mellitus type 2- seems controlled. Add ssi. 2. Htn- Uncontrolled. Add norvasc. Agree with holding acei in the acute setting. 3. PVD/s/p infected graft- per [facility]. 4. CAD- obtain ekg. Continue asa/beta blocker/zetia/lipitor, later acei. 5.  Hyperlipidemia- on zetia/lipitor. 6. S/p traumatic amputation- defer mx to ortho.  I was asked by Dr Despina Hick to see Mr [name] for perioperative medical management of pvd/htn/dm/cad. He came in after he had traumatic amputation of his left foot with a lawn mower. He has since had irrigation and debridement with revision of left foot amputation by Dr Despina Hick. We are asked to assist with perioperative mx, given [name] multiple medical problems. He is a poor historian due to limited hearing and dementia. He denies any complaints, but says he just had vascular surgery with complication of an infected graft for which he is on doxycycline. Otherwise he has no other complaints.  Review of Systems:  Unremarkable except as highlighted in the hpi.  Past Medical History  Diagnosis Date  . Hyperlipidemia     takes Atorvastatin daily  . CAD (coronary artery disease)   . Gastritis   . BPH (benign prostatic hypertrophy)     Dr.Nesi is urologist  . Arthritis   . Claudication   . Hypertension     takes Ramipril and Metoprolol daily  . Heart murmur   . Peripheral vascular disease   . Asthma     as a young child  . Bruises easily     takes Pletal and ASA daily  . Colitis     hx of  . Hx of colonic polyps   . Urinary urgency     takes Flomax daily  . Diabetes mellitus     borderline diabetic  . Insomnia     takes Ambien nightly  . History of  shingles 3-81yrs ago  . Blind right eye     "since age 30-4; S/P whooping cough"  . Mental disorder     takes Aricept daily  . DEMENTIA    Past Surgical History  Procedure Date  . Colonoscopy   . Axillary-femoral bypass graft 05/05/2011    Procedure: BYPASS GRAFT AXILLA-BIFEMORAL;  Surgeon: Sherren Kerns, MD;  Location: [facility] OR;  Service: Vascular;  Laterality: Right;  . Femoral-popliteal bypass graft 05/05/2011    Procedure: BYPASS GRAFT FEMORAL-POPLITEAL ARTERY;  Surgeon: Sherren Kerns, MD;  Location: [facility] OR;  Service: Vascular;  Laterality: Right;  . Tonsillectomy 1960  . Aortic valve replacement 06/2003    /E-chart  . Band hemorrhoidectomy 1960's  . Ostectomy 09/2001    right hip/E-chart  . Cardiac catheterization     multiple-see epic  . Coronary angioplasty     right leg  . Coronary angioplasty with stent placement 2000    /E-chart  . Coronary artery bypass graft 1989/06/2003    CABG X 5; CABG X1  . Cardiac valve replacement 06/2003  . Cataract extraction     left eye  . Axillary-femoral bypass graft 06/10/2011    Procedure: BYPASS GRAFT AXILLA-BIFEMORAL;  Surgeon: Sherren Kerns, MD;  Location: [facility] OR;  Service: Vascular;  Laterality: Right;  REVISION Right axillary bifemoral bypass using a 8mm x 80 cm Propaten Graft   Social History:  reports that he  quit smoking about 71 years ago. His smoking use included Cigarettes. He has never used smokeless tobacco. He reports that he drinks alcohol. He reports that he does not use illicit drugs.  Allergies  Allergen Reactions  . Shrimp (Shellfish Allergy) Nausea And Vomiting    "haven't eaten any since 1980's" pt only allergic to shrimp. Not shellfish   Family History  Problem Relation Age of Onset  . Heart disease Father   . Anesthesia problems Neg Hx   . Hypotension Neg Hx   . Malignant hyperthermia Neg Hx   . Pseudochol deficiency Neg Hx     Prior to Admission medications   Medication Sig Start Date End Date Taking?  Authorizing Provider  aspirin EC 325 MG tablet Take 325 mg by mouth at bedtime.    Yes Historical Provider, MD  atorvastatin (LIPITOR) 40 MG tablet Take 40 mg by mouth daily.   Yes Historical Provider, MD  cilostazol (PLETAL) 100 MG tablet Take 100 mg by mouth 2 (two) times daily.    Yes Historical Provider, MD  donepezil (ARICEPT) 10 MG tablet Take 10 mg by mouth at bedtime.    Yes Historical Provider, MD  doxycycline (VIBRA-TABS) 100 MG tablet Take 100 mg by mouth Twice daily. 06/13/11  Yes Historical Provider, MD  ezetimibe (ZETIA) 10 MG tablet Take 10 mg by mouth at bedtime.     Yes Historical Provider, MD  fesoterodine (TOVIAZ) 4 MG TB24 Take 4 mg by mouth at bedtime.    Yes Historical Provider, MD  metoprolol succinate (TOPROL-XL) 25 MG 24 hr tablet Take 37.5 mg by mouth 2 (two) times daily.    Yes Historical Provider, MD  multivitamin [facility]) per tablet Take 1 tablet by mouth daily. 06/13/11 06/12/12 Yes Regina J Roczniak, PA  nitroGLYCERIN (NITROSTAT) 0.4 MG SL tablet Place 0.4 mg under the tongue every 5 (five) minutes as needed. For chest pain   Yes Historical Provider, MD  Probiotic Product (PROBIOTIC FORMULA) CAPS Take 356 each by mouth daily.   Yes Historical Provider, MD  ramipril (ALTACE) 5 MG tablet Take 5 mg by mouth daily.     Yes Historical Provider, MD  Tamsulosin HCl (FLOMAX) 0.4 MG CAPS Take 0.4 mg by mouth at bedtime.    Yes Historical Provider, MD  vitamin C (ASCORBIC ACID) 500 MG tablet Take 500 mg by mouth 2 (two) times daily.   Yes Historical Provider, MD  Vitamin D, Ergocalciferol, (DRISDOL) 50000 UNITS CAPS Take 50,000 Units by mouth every 7 (seven) days. Every tuesday 06/18/11  Yes Historical Provider, MD  zolpidem (AMBIEN) 10 MG tablet Take 10 mg by mouth at bedtime as needed. For sleep   Yes Historical Provider, MD   Physical Exam: Blood pressure 159/55, pulse 90, temperature 97.8 F (36.6 C), temperature source Oral, resp. rate 12, SpO2 94.00%. Filed Vitals:    07/04/11 1915 07/04/11 1929 07/04/11 1930 07/04/11 1956  BP: 117/71  126/66 159/55  Pulse: 87 87 87 90  Temp:  98.2 F (36.8 C)  97.8 F (36.6 C)  TempSrc:      Resp: 15 12 11 12   SpO2: 100% 100% 100% 94%     General:  Seems comfortable.  Eyes: teary  ENT: hearing aid in place.  Neck: no jvd.  Cardiovascular: S1S2 heard. No murmurs. RRR.  Respiratory: Lungs clear.  Abdomen: soft, non tender. +BS  Musculoskeletal: no bleeding left foot  Neurologic: No acute findings.  Labs on Admission:  Basic Metabolic Panel:  Lab 07/04/11  1420  NA 143  K 3.8  CL 108  CO2 --  GLUCOSE 110*  BUN 18  CREATININE 1.40*  CALCIUM --  MG --  PHOS --   Liver Function Tests: No results found for this basename: AST:5,ALT:5,ALKPHOS:5,BILITOT:5,PROT:5,ALBUMIN:5 in the last 168 hours No results found for this basename: LIPASE:5,AMYLASE:5 in the last 168 hours No results found for this basename: AMMONIA:5 in the last 168 hours CBC:  Lab 07/04/11 1420 07/04/11 1403  WBC -- 3.2*  NEUTROABS -- --  HGB 10.9* 10.5*  HCT 32.0* 31.8*  MCV -- 95.8  PLT -- 98*   Cardiac Enzymes: No results found for this basename: CKTOTAL:5,CKMB:5,CKMBINDEX:5,TROPONINI:5 in the last 168 hours BNP: No components found with this basename: POCBNP:5 CBG: No results found for this basename: GLUCAP:5 in the last 168 hours  Radiological Exams on Admission: Dg Chest Port 1 View  07/04/2011  *RADIOLOGY REPORT*  Clinical Data: Laceration to but from lawnmower  PORTABLE CHEST - 1 VIEW  Comparison: 06/05/2011  Findings: Borderline cardiomegaly.  Pulmonary vascularity is within normal limits.  Lungs are clear.  No pneumothorax.  No acute bony deformity.  IMPRESSION: Borderline cardiomegaly without edema.  Original Report Authenticated By: Donavan Burnet, M.D.   Dg Foot Complete Left  07/04/2011  *RADIOLOGY REPORT*  Clinical Data: Laceration from lawnmower  LEFT FOOT - COMPLETE 3+ VIEW  Comparison: None.  Findings:  The great toe has been amputated.  The tip of the first metatarsal is fractured and fragmented.  Bone fragments of the toe project over the soft tissues.  IMPRESSION: Great toe amputation and distal first metatarsal fracture.  Original Report Authenticated By: Donavan Burnet, M.D.    EKG: Independently reviewed. Planned.   I will followup again tomorrow. Please contact me if I can be of assistance in the meanwhile. Thank you for this consultation.  Conley Canal, MD  [facility] Pager [phone]  If 7PM-7AM, please contact night-coverage [email or web address] Password [facility] 07/04/2011, 8:44 PM

## 2011-07-04 NOTE — Anesthesia Preprocedure Evaluation (Addendum)
Anesthesia Evaluation  Patient identified by MRN, date of birth, ID band Patient awake    Reviewed: Allergy & Precautions, H&P , NPO status , Patient's Chart, lab work & pertinent test results, reviewed documented beta blocker date and time   Airway Mallampati: II TM Distance: >3 FB Neck ROM: Full    Dental  (+) Partial Upper, Partial Lower and Dental Advisory Given   Pulmonary asthma ,          Cardiovascular hypertension, + CAD and + CABG + Valvular Problems/Murmurs     Neuro/Psych    GI/Hepatic   Endo/Other    Renal/GU      Musculoskeletal   Abdominal   Peds  Hematology   Anesthesia Other Findings   Reproductive/Obstetrics                          Anesthesia Physical Anesthesia Plan  ASA: III  Anesthesia Plan: General   Post-op Pain Management:    Induction: Intravenous  Airway Management Planned: Oral ETT  Additional Equipment:   Intra-op Plan:   Post-operative Plan: Extubation in OR  Informed Consent: I have reviewed the patients History and Physical, chart, labs and discussed the procedure including the risks, benefits and alternatives for the proposed anesthesia with the patient or authorized representative who has indicated his/her understanding and acceptance.   Dental advisory given  Plan Discussed with: Surgeon and CRNA  Anesthesia Plan Comments:        Anesthesia Quick Evaluation

## 2011-07-04 NOTE — Anesthesia Postprocedure Evaluation (Signed)
Anesthesia Post Note  Patient: [name].  Procedure(s) Performed: Procedure(s) (LRB): IRRIGATION AND DEBRIDEMENT EXTREMITY (Left) AMPUTATION FOOT (Left)  Anesthesia type: general  Patient location: PACU  Post pain: Pain level controlled  Post assessment: Patient's Cardiovascular Status Stable  Last Vitals:  Filed Vitals:   07/04/11 1455  BP: 105/69  Pulse: 83  Temp:   Resp: 20    Post vital signs: Reviewed and stable  Level of consciousness: sedated  Complications: No apparent anesthesia complications

## 2011-07-04 NOTE — ED Notes (Signed)
Report called to OR spoke with Theodoro Grist.

## 2011-07-04 NOTE — ED Notes (Signed)
md at bedside for ortho

## 2011-07-04 NOTE — ED Notes (Signed)
Patient is resting comfortably. 

## 2011-07-04 NOTE — Interval H&P Note (Signed)
History and Physical Interval Note:  07/04/2011 3:45 PM  [name].  has presented today for surgery, with the diagnosis of Traumatic Amputation Left Foot  The various methods of treatment have been discussed with the patient and family. After consideration of risks, benefits and other options for treatment, the patient has consented to  Procedure(s) (LRB): IRRIGATION AND DEBRIDEMENT EXTREMITY (Left) AMPUTATION FOOT (Left) as a surgical intervention .  The patients' history has been reviewed, patient examined, no change in status, stable for surgery.  I have reviewed the patients' chart and labs.  Questions were answered to the patient's satisfaction.     Loanne Drilling

## 2011-07-04 NOTE — Transfer of Care (Signed)
Immediate Anesthesia Transfer of Care Note  Patient: [name].  Procedure(s) Performed: Procedure(s) (LRB): IRRIGATION AND DEBRIDEMENT EXTREMITY (Left) AMPUTATION FOOT (Left)  Patient Location: PACU  Anesthesia Type: General  Level of Consciousness: awake, alert , oriented and patient cooperative  Airway & Oxygen Therapy: Patient Spontanous Breathing and Patient connected to face mask oxygen  Post-op Assessment: Report given to PACU RN, Post -op Vital signs reviewed and stable and Patient moving all extremities  Post vital signs: Reviewed and stable  Complications: No apparent anesthesia complications

## 2011-07-05 DIAGNOSIS — E118 Type 2 diabetes mellitus with unspecified complications: Secondary | ICD-10-CM

## 2011-07-05 DIAGNOSIS — S98119A Complete traumatic amputation of unspecified great toe, initial encounter: Secondary | ICD-10-CM

## 2011-07-05 DIAGNOSIS — I798 Other disorders of arteries, arterioles and capillaries in diseases classified elsewhere: Secondary | ICD-10-CM

## 2011-07-05 DIAGNOSIS — E1165 Type 2 diabetes mellitus with hyperglycemia: Secondary | ICD-10-CM

## 2011-07-05 DIAGNOSIS — I10 Essential (primary) hypertension: Secondary | ICD-10-CM

## 2011-07-05 LAB — PROTIME-INR: Prothrombin Time: 15.8 seconds — ABNORMAL HIGH (ref 11.6–15.2)

## 2011-07-05 LAB — BASIC METABOLIC PANEL
BUN: 13 mg/dL (ref 6–23)
CO2: 27 mEq/L (ref 19–32)
Calcium: 8.8 mg/dL (ref 8.4–10.5)
Creatinine, Ser: 1.21 mg/dL (ref 0.50–1.35)
GFR calc non Af Amer: 53 mL/min — ABNORMAL LOW (ref >90)
Glucose, Bld: 154 mg/dL — ABNORMAL HIGH (ref 70–99)

## 2011-07-05 LAB — CBC
MCH: 30.9 pg (ref 26.0–34.0)
MCHC: 32.7 g/dL (ref 30.0–36.0)
MCV: 94.4 fL (ref 78.0–100.0)
Platelets: 101 10*3/uL — ABNORMAL LOW (ref 150–400)

## 2011-07-05 LAB — GLUCOSE, CAPILLARY
Glucose-Capillary: 117 mg/dL — ABNORMAL HIGH (ref 70–99)
Glucose-Capillary: 127 mg/dL — ABNORMAL HIGH (ref 70–99)
Glucose-Capillary: 136 mg/dL — ABNORMAL HIGH (ref 70–99)

## 2011-07-05 MED ORDER — AMLODIPINE BESYLATE 2.5 MG PO TABS
2.5000 mg | ORAL_TABLET | Freq: Every day | ORAL | Status: DC
  Administered 2011-07-06: 2.5 mg via ORAL
  Filled 2011-07-05: qty 1

## 2011-07-05 NOTE — Evaluation (Signed)
Physical Therapy Evaluation Patient Details Name: [name]. MRN: [id number] DOB: 11-23-1924 Today's Date: 07/05/2011 Time: 4010-2725 PT Time Calculation (min): 23 min  PT Assessment / Plan / Recommendation Clinical Impression  Mr. [name] is 78 y/o male admitted for traumatic amputation of left great toe secondary to lawn mower accident. Pt presents to PT today with deficits in his gait and balance secondary to pain. Will benefit physical therapy in the acute setting to address these and the below problem list so as to  maximize his safety for d/c home with 24 hour assist. Rec HHPT with 24 hour assist for follow up.     PT Assessment  Patient needs continued PT services    Follow Up Recommendations  Home health PT;Supervision/Assistance - 24 hour    Barriers to Discharge        lEquipment Recommendations  Rolling walker with 5" wheels (only if pt doesn't have RW)    Recommendations for Other Services OT consult   Frequency 7X/week    Precautions / Restrictions Precautions Precautions: Fall Restrictions Weight Bearing Restrictions: Yes LLE Weight Bearing: Weight bearing as tolerated Other Position/Activity Restrictions: with post op shoe     Mobility  Transfers Sit to Stand: 4: Min assist;With upper extremity assist;From chair/3-in-1 Stand to Sit: 4: Min guard;With upper extremity assist;To chair/3-in-1;With armrests Details for Transfer Assistance: minA to stabilize immediately upon standing; mingaurdA for slow descent to chair and cues for technique Ambulation/Gait Ambulation/Gait Assistance: 4: Min guard Ambulation Distance (Feet): 80 Feet Assistive device: Rolling walker Ambulation/Gait Assistance Details: cues for upright posture and safe technique with RW Gait Pattern: Step-through pattern;Trunk flexed    Exercises     PT Diagnosis: Difficulty walking;Abnormality of gait;Acute pain  PT Problem List: Decreased activity tolerance;Decreased balance;Decreased  mobility;Decreased knowledge of use of DME;Pain PT Treatment Interventions: DME instruction;Gait training;Functional mobility training;Stair training;Therapeutic activities;Therapeutic exercise;Balance training;Neuromuscular re-education;Cognitive remediation;Patient/family education   PT Goals Acute Rehab PT Goals PT Goal Formulation: With patient Time For Goal Achievement: 07/12/11 Pt will go Sit to Stand: with modified independence PT Goal: Sit to Stand - Progress: Goal set today Pt will go Stand to Sit: with modified independence PT Goal: Stand to Sit - Progress: Goal set today Pt will Transfer Bed to Chair/Chair to Bed: with modified independence PT Transfer Goal: Bed to Chair/Chair to Bed - Progress: Goal set today Pt will Stand: with modified independence;3 - 5 min PT Goal: Stand - Progress: Goal set today Pt will Ambulate: >150 feet;with modified independence;with least restrictive assistive device PT Goal: Ambulate - Progress: Goal set today Pt will Go Up / Down Stairs: 1-2 stairs;with min assist;with least restrictive assistive device PT Goal: Up/Down Stairs - Progress: Goal set today  Visit Information  Last PT Received On: 07/05/11 Assistance Needed: +1    Subjective Data  Subjective: Im not trying to be a complainer but I've been very dissapointed in a few things here. My food was really cold when I got it.  Patient Stated Goal: return home   Prior Functioning  Home Living Lives With: Spouse Available Help at Discharge: Family;Available 24 hours/day Type of Home: House Home Access: Stairs to enter [employer] of Steps: 1 Entrance Stairs-Rails: None Home Layout: One level Bathroom Shower/Tub: Engineer, manufacturing systems: Standard Home Adaptive Equipment: Straight cane;Walker - rolling Prior Function Level of Independence: Independent with assistive device(s) (was using walking cane) Driving: No Vocation: Retired Musician: No  difficulties;HOH    Cognition  Overall Cognitive Status: Appears  within functional limits for tasks assessed/performed Arousal/Alertness: Awake/alert Orientation Level: Appears intact for tasks assessed Behavior During Session: Agitated Cognition - Other Comments: annoyed with staff because his room was cold and thought they were accusing him of being a complainer    Extremity/Trunk Assessment Right Upper Extremity Assessment RUE ROM/Strength/Tone: Within functional levels RUE Sensation: WFL - Light Touch;WFL - Proprioception RUE Coordination: WFL - gross/fine motor Left Upper Extremity Assessment LUE ROM/Strength/Tone: Within functional levels LUE Sensation: WFL - Light Touch;WFL - Proprioception LUE Coordination: WFL - gross/fine motor Right Lower Extremity Assessment RLE ROM/Strength/Tone: WFL for tasks assessed RLE Sensation: WFL - Light Touch;WFL - Proprioception RLE Coordination: WFL - gross/fine motor Left Lower Extremity Assessment LLE ROM/Strength/Tone: WFL for tasks assessed LLE Sensation: WFL - Light Touch;WFL - Proprioception LLE Coordination: WFL - gross/fine motor Trunk Assessment Trunk Assessment: Kyphotic   Balance    End of Session PT - End of Session Equipment Utilized During Treatment: Gait belt Activity Tolerance: Patient tolerated treatment well Patient left: in chair;with call bell/phone within reach   [facility] [name] 07/05/2011, 2:20 PM

## 2011-07-05 NOTE — Progress Notes (Signed)
Subjective: Little sore in left foot otherwise ok   Objective: Vital signs in last 24 hours: Temp:  [97.8 F (36.6 C)-98.9 F (37.2 C)] 98.9 F (37.2 C) (05/11 0507) Pulse Rate:  [68-100] 100  (05/11 0507) Resp:  [9-26] 18  (05/11 0507) BP: (92-177)/(55-80) 117/59 mmHg (05/11 0643) SpO2:  [93 %-100 %] 93 % (05/11 0507)  Intake/Output from previous day: 05/10 0701 - 05/11 0700 In: 2140 [P.O.:240; I.V.:1900] Out: 650 [Urine:650] Intake/Output this shift:     Basename 07/05/11 0547 07/04/11 1420 07/04/11 1403  HGB 10.4* 10.9* 10.5*    Basename 07/05/11 0547 07/04/11 1420 07/04/11 1403  WBC 6.3 -- 3.2*  RBC 3.37* -- 3.32*  HCT 31.8* 32.0* --  PLT 101* -- 98*    Basename 07/05/11 0547 07/04/11 1420  NA 138 143  K 4.3 3.8  CL 104 108  CO2 27 --  BUN 13 18  CREATININE 1.21 1.40*  GLUCOSE 154* 110*  CALCIUM 8.8 --    Basename 07/05/11 0547  LABPT --  INR 1.23  Exam- cons alert no distress foot dependant in BS chair. Toes blanch and refill briskly and has sense in all 4 toes, no drainage. Assessment/Plan: POD#1 S/P I+D of traumatic amp left great toe doing well. Plan Post OP shoe, continue with antibiotic. Jamelle Rushing 07/05/2011, 10:09 AM

## 2011-07-05 NOTE — Progress Notes (Signed)
Orthopedic Tech Progress Note Patient Details:  [name] 12-11-1924 [id number]  Other Ortho Devices Type of Ortho Device: Postop boot Ortho Device Location: left foot Ortho Device Interventions: Application   [name] T 07/05/2011, 11:14 AM

## 2011-07-05 NOTE — Op Note (Signed)
NAMEJODEN, BONSALL NO.:  [id number]  MEDICAL RECORD NO.:  [id number]  LOCATION:  5007                         FACILITY:  [facility]  PHYSICIAN:  Ollen Gross, M.D.    DATE OF BIRTH:  11-05-1924  DATE OF PROCEDURE:  07/04/2011 DATE OF DISCHARGE:                              OPERATIVE REPORT   POSTOP DIAGNOSIS:  Lawnmower injury, left foot with partial amputation, great toe.  POSTOPERATIVE DIAGNOSIS:  Lawnmower injury, left foot with partial amputation, great toe.  PROCEDURE:  Irrigation and debridement, left foot with revision amputation.  SURGEON:  Ollen Gross, MD  ASSISTANT:  Alexzandrew L. Julien Girt, PA-C  ANESTHESIA:  General.  ESTIMATED BLOOD LOSS:  Minimal.  DRAINS:  None.  COMPLICATIONS:  None.  CONDITION:  Stable to recovery.  CLINICAL NOTE:  Mr. [name] is an 78 year old male who was mowing his lawn earlier today and thought he had chipped the mower off, but when he step down, his foot got corner of the mower and the blade amputate his great toe on the left.  He was taken immediately to the [facility] Emergency Department and evaluation there showed great toe amputation without injury to the other toes.  He also had partially cut off his first metatarsal.  Sterile dressing was placed in the emergency room.  He was taken immediately to the operating room for an irrigation and debridement, and revision amputation.  PROCEDURE IN DETAIL:  After successful administration of general anesthetic, the patient's left lower extremity was prepped and draped in usual sterile fashion.  We did not use a tourniquet as he has had vascular bypass grafts and did not have much bleeding.  The foot was then prepped and draped in usual sterile fashion.  The bony fragments of the distal metatarsal were removed.  There was minimal of any contamination with just a small number of various minute blades of grass.  This was all removed.  The metatarsal was fractured  distally.  I cleared the soft tissue for about a centimeter proximal to the amputation site and then with an oscillating saw removed down until we make a fresh bone cut.  The sesamoids were removed.  The tissue had a very healthy appearance.  He was copiously irrigated with a liter of saline solution.  Minor bleeding was identified and stopped with electrocautery.  The second toe had a dusky appearance in the emergency room, but pinked up very well after we had debrided the wound and had a very viable appearance.  We then closed the subcu tissue with interrupted 2-0 Vicryl and skin with interrupted 4-0 nylon.  The incision was cleaned and dried and a bulky sterile dressing applied.  He was then awakened and transported to recovery in stable condition.     Ollen Gross, M.D.     FA/MEDQ  D:  07/04/2011  T:  07/04/2011  Job:  [id number]

## 2011-07-05 NOTE — Progress Notes (Signed)
Subjective: Pt sitting in chair, denies any Perry/o. Chart reviewed Objective: Vital signs in last 24 hours: Temp:  [98.9 F (37.2 Perry)-99 F (37.2 Perry)] 99 F (37.2 Perry) (05/11 1344) Pulse Rate:  [56-100] 56  (05/11 1344) Resp:  [18] 18  (05/11 1344) BP: (91-177)/(44-65) 91/44 mmHg (05/11 1344) SpO2:  [90 %-93 %] 90 % (05/11 1344) Last BM Date: 07/05/11 Intake/Output from previous day: 05/10 0701 - 05/11 0700 In: 2380 [P.O.:480; I.V.:1900] Out: 650 [Urine:650] Intake/Output this shift: Total I/O In: -  Out: 500 [Urine:500]    General Appearance:    Alert, cooperative, no distress, appears stated age  Lungs:     Clear to auscultation bilaterally, respirations unlabored   Heart:    Regular rate and rhythm, S1 and S2 normal, no murmur, rub   or gallop  Abdomen:     Soft, non-tender, bowel sounds active all four quadrants,    no masses, no organomegaly  Extremities:   L.foot with dressing clean and dry,no edema, no cyanosis   Neurologic:   CNII-XII intact, normal strength, sensation and reflexes    throughout    Weight change:   Intake/Output Summary (Last 24 hours) at 07/05/11 2102 Last data filed at 07/05/11 2018  Gross per 24 hour  Intake   1620 ml  Output   1150 ml  Net    470 ml    Lab Results:   Basename 07/05/11 0547 07/04/11 1420  NA 138 143  K 4.3 3.8  CL 104 108  CO2 27 --  GLUCOSE 154* 110*  BUN 13 18  CREATININE 1.21 1.40*  CALCIUM 8.8 --    Basename 07/05/11 0547 07/04/11 1420 07/04/11 1403  WBC 6.3 -- 3.2*  HGB 10.4* 10.9* --  HCT 31.8* 32.0* --  PLT 101* -- 98*  MCV 94.4 -- 95.8   PT/INR  Basename 07/05/11 0547  LABPROT 15.8*  INR 1.23   ABG No results found for this basename: PHART:2,PCO2:2,PO2:2,HCO3:2 in the last 72 hours  Micro Results: No results found for this or any previous visit (from the past 240 hour(s)). Studies/Results: Dg Chest Port 1 View  07/04/2011  *RADIOLOGY REPORT*  Clinical Data: Laceration to but from lawnmower   PORTABLE CHEST - 1 VIEW  Comparison: 06/05/2011  Findings: Borderline cardiomegaly.  Pulmonary vascularity is within normal limits.  Lungs are clear.  No pneumothorax.  No acute bony deformity.  IMPRESSION: Borderline cardiomegaly without edema.  Original Report Authenticated By: Donavan Burnet, M.D.   Dg Foot Complete Left  07/04/2011  *RADIOLOGY REPORT*  Clinical Data: Laceration from lawnmower  LEFT FOOT - COMPLETE 3+ VIEW  Comparison: None.  Findings: The great toe has been amputated.  The tip of the first metatarsal is fractured and fragmented.  Bone fragments of the toe project over the soft tissues.  IMPRESSION: Great toe amputation and distal first metatarsal fracture.  Original Report Authenticated By: Donavan Burnet, M.D.   Medications:  Scheduled Meds:   . amLODipine  5 mg Oral Daily  . atorvastatin  40 mg Oral Daily  .  ceFAZolin (ANCEF) IV  1 g Intravenous Q6H  . cilostazol  100 mg Oral BID  . docusate sodium  100 mg Oral BID  . donepezil  10 mg Oral QHS  . doxycycline  100 mg Oral Q12H  . enoxaparin  40 mg Subcutaneous Q24H  . ezetimibe  10 mg Oral QHS  . fesoterodine  4 mg Oral QHS  . insulin aspart  0-9  Units Subcutaneous TID WC  . metoprolol succinate  37.5 mg Oral BID  . ramipril  5 mg Oral Daily  . Tamsulosin HCl  0.4 mg Oral QHS   Continuous Infusions:   . sodium chloride 75 mL/hr at 07/05/11 0636   PRN Meds:.bisacodyl, HYDROcodone-acetaminophen, methocarbamol (ROBAXIN) IV, methocarbamol, metoCLOPramide (REGLAN) injection, metoCLOPramide, morphine injection, nitroGLYCERIN, ondansetron (ZOFRAN) IV, ondansetron, polyethylene glycol, sodium phosphate, zolpidem Assessment/Plan: 1. Diabetes Mellitus type 2- controlled, continue ssi.  2. Htn- now with low BP on added norvasc- dEcrease dose and add hold parameters.  3. PVD/s/p infected graft- per [facility].  4. CAD- chest pain free, Continue asa/beta blocker/zetia/lipitor/acei.  5. Hyperlipidemia- on zetia/lipitor.  6. S/p  traumatic amputation- per ortho.      LOS: 1 day   [name] 07/05/2011, 9:02 PM

## 2011-07-06 LAB — BASIC METABOLIC PANEL
BUN: 14 mg/dL (ref 6–23)
Calcium: 8.4 mg/dL (ref 8.4–10.5)
Creatinine, Ser: 1.33 mg/dL (ref 0.50–1.35)
GFR calc Af Amer: 55 mL/min — ABNORMAL LOW (ref >90)
GFR calc non Af Amer: 47 mL/min — ABNORMAL LOW (ref >90)

## 2011-07-06 LAB — CBC
MCHC: 33.3 g/dL (ref 30.0–36.0)
Platelets: 83 10*3/uL — ABNORMAL LOW (ref 150–400)
RDW: 16.4 % — ABNORMAL HIGH (ref 11.5–15.5)

## 2011-07-06 LAB — GLUCOSE, CAPILLARY: Glucose-Capillary: 113 mg/dL — ABNORMAL HIGH (ref 70–99)

## 2011-07-06 NOTE — Progress Notes (Signed)
Physical Therapy Treatment Patient Details Name: [name]. MRN: [id number] DOB: 11-Apr-1924 Today's Date: 07/06/2011 Time: 6962-9528 PT Time Calculation (min): 28 min  PT Assessment / Plan / Recommendation Comments on Treatment Session  Pt admitted s/p left great toe traumatic amputation and is progressing well.  Pt able to tolerate increase ambulation distance with increased independence as well as stair negotiation this am.    Follow Up Recommendations  Home health PT;Supervision/Assistance - 24 hour    Barriers to Discharge        Equipment Recommendations  Rolling walker with 5" wheels    Recommendations for Other Services    Frequency Min 5X/week   Plan Discharge plan remains appropriate;Frequency needs to be updated    Precautions / Restrictions Precautions Precautions: Fall Restrictions Weight Bearing Restrictions: Yes LLE Weight Bearing: Weight bearing as tolerated Other Position/Activity Restrictions: Left LE cast shoe.   Pertinent Vitals/Pain Pt states "My left foot hurts."  Unable to rate on 0-10 scale.  Pt repositioned.    Mobility  Bed Mobility Bed Mobility: Not assessed Transfers Transfers: Sit to Stand;Stand to Sit (2 trials.) Sit to Stand: 5: Supervision;With upper extremity assist;From chair/3-in-1 Stand to Sit: 5: Supervision;With upper extremity assist;To chair/3-in-1 Details for Transfer Assistance: Verbal cues for safest hand placement. Ambulation/Gait Ambulation/Gait Assistance: 5: Supervision Ambulation Distance (Feet): 120 Feet (100 feet and then 20 feet.) Assistive device: Rolling walker Ambulation/Gait Assistance Details: Verbal cues for tall posture and to stay inside RW with pt having tendency to keep bilateral UEs fully extended/RW far ahead. Gait Pattern: Step-through pattern;Trunk flexed;Antalgic Stairs: Yes Stairs Assistance: 4: Min guard (Verbal cues for sequence "up with good, down with bad.") Stair Management Technique: Step  to pattern;Backwards;With walker Number of Stairs: 1  (Handout given.) Wheelchair Mobility Wheelchair Mobility: No    Exercises     PT Diagnosis:    PT Problem List:   PT Treatment Interventions:     PT Goals Acute Rehab PT Goals PT Goal Formulation: With patient Time For Goal Achievement: 07/12/11 Potential to Achieve Goals: Good PT Goal: Sit to Stand - Progress: Progressing toward goal PT Goal: Stand to Sit - Progress: Progressing toward goal PT Goal: Stand - Progress: Progressing toward goal PT Goal: Ambulate - Progress: Progressing toward goal PT Goal: Up/Down Stairs - Progress: Met  Visit Information  Last PT Received On: 07/06/11 Assistance Needed: +1    Subjective Data  Subjective: "We can try to go walking." Patient Stated Goal: return home   Cognition  Overall Cognitive Status: Appears within functional limits for tasks assessed/performed Arousal/Alertness: Awake/alert Orientation Level: Appears intact for tasks assessed Behavior During Session: [facility] for tasks performed    Balance  Balance Balance Assessed: No  End of Session PT - End of Session Equipment Utilized During Treatment: Gait belt;Other (comment) (Cast shoe left.) Activity Tolerance: Patient tolerated treatment well Patient left: in chair;with call bell/phone within reach Nurse Communication: Mobility status;Weight bearing status    Cephus Shelling 07/06/2011, 8:45 AM  07/06/2011 Cephus Shelling, PT, DPT [phone]

## 2011-07-06 NOTE — Progress Notes (Signed)
D/c instructions reviewed wit patient and son. RX x 2 on 1 sheet given to pt. hh services arranged with [facility]. RW at bedside. All questions answered. Pt d/c'ed via wheelchair in stable condition

## 2011-07-06 NOTE — Discharge Summary (Signed)
Physician Discharge Summary  Patient ID: [name]. MRN: [id number] DOB/AGE: [birth year] 76 y.o.  Admit date: 07/04/2011 Discharge date: 07/06/2011  Admission Diagnoses:  Principal Problem:  *Amputation, toe, traumatic   Discharge Diagnoses:  Same   Surgeries: Procedure(s): IRRIGATION AND DEBRIDEMENT EXTREMITY AMPUTATION FOOT on 07/04/2011   Consultants: pt/ot  Discharged Condition: Stable  Hospital Course: [name]. is an 78 y.o. male who was admitted 07/04/2011 with a chief complaint of  Chief Complaint  Patient presents with  . Laceration  , and found to have a diagnosis of Amputation, toe, traumatic.  They were brought to the operating room on 07/04/2011 and underwent the above named procedures.    The patient had an uncomplicated hospital course and was stable for discharge.  Recent vital signs:  Filed Vitals:   07/06/11 0635  BP: 116/55  Pulse: 96  Temp: 101.4 F (38.6 C)  Resp: 16    Recent laboratory studies:  Results for orders placed during the hospital encounter of 07/04/11  CBC      Component Value Range   WBC 3.2 (*) 4.0 - 10.5 (K/uL)   RBC 3.32 (*) 4.22 - 5.81 (MIL/uL)   Hemoglobin 10.5 (*) 13.0 - 17.0 (g/dL)   HCT 78.2 (*) 95.6 - 52.0 (%)   MCV 95.8  78.0 - 100.0 (fL)   MCH 31.6  26.0 - 34.0 (pg)   MCHC 33.0  30.0 - 36.0 (g/dL)   RDW 21.3 (*) 08.6 - 15.5 (%)   Platelets 98 (*) 150 - 400 (K/uL)  POCT I-STAT, CHEM 8      Component Value Range   Sodium 143  135 - 145 (mEq/L)   Potassium 3.8  3.5 - 5.1 (mEq/L)   Chloride 108  96 - 112 (mEq/L)   BUN 18  6 - 23 (mg/dL)   Creatinine, Ser 5.78 (*) 0.50 - 1.35 (mg/dL)   Glucose, Bld 469 (*) 70 - 99 (mg/dL)   Calcium, Ion 6.29  5.28 - 1.32 (mmol/L)   TCO2 26  0 - 100 (mmol/L)   Hemoglobin 10.9 (*) 13.0 - 17.0 (g/dL)   HCT 41.3 (*) 24.4 - 52.0 (%)  BASIC METABOLIC PANEL      Component Value Range   Sodium 138  135 - 145 (mEq/L)   Potassium 4.3  3.5 - 5.1 (mEq/L)   Chloride 104  96  - 112 (mEq/L)   CO2 27  19 - 32 (mEq/L)   Glucose, Bld 154 (*) 70 - 99 (mg/dL)   BUN 13  6 - 23 (mg/dL)   Creatinine, Ser 0.10  0.50 - 1.35 (mg/dL)   Calcium 8.8  8.4 - 27.2 (mg/dL)   GFR calc non Af Amer 53 (*) >90 (mL/min)   GFR calc Af Amer 62 (*) >90 (mL/min)  CBC      Component Value Range   WBC 6.3  4.0 - 10.5 (K/uL)   RBC 3.37 (*) 4.22 - 5.81 (MIL/uL)   Hemoglobin 10.4 (*) 13.0 - 17.0 (g/dL)   HCT 53.6 (*) 64.4 - 52.0 (%)   MCV 94.4  78.0 - 100.0 (fL)   MCH 30.9  26.0 - 34.0 (pg)   MCHC 32.7  30.0 - 36.0 (g/dL)   RDW 03.4 (*) 74.2 - 15.5 (%)   Platelets 101 (*) 150 - 400 (K/uL)  PROTIME-INR      Component Value Range   Prothrombin Time 15.8 (*) 11.6 - 15.2 (seconds)   INR 1.23  0.00 - 1.49  GLUCOSE, CAPILLARY      Component Value Range   Glucose-Capillary 154 (*) 70 - 99 (mg/dL)  GLUCOSE, CAPILLARY      Component Value Range   Glucose-Capillary 117 (*) 70 - 99 (mg/dL)   Comment 1 Notify RN    GLUCOSE, CAPILLARY      Component Value Range   Glucose-Capillary 127 (*) 70 - 99 (mg/dL)  BASIC METABOLIC PANEL      Component Value Range   Sodium 138  135 - 145 (mEq/L)   Potassium 3.6  3.5 - 5.1 (mEq/L)   Chloride 104  96 - 112 (mEq/L)   CO2 26  19 - 32 (mEq/L)   Glucose, Bld 102 (*) 70 - 99 (mg/dL)   BUN 14  6 - 23 (mg/dL)   Creatinine, Ser 1.61  0.50 - 1.35 (mg/dL)   Calcium 8.4  8.4 - 09.6 (mg/dL)   GFR calc non Af Amer 47 (*) >90 (mL/min)   GFR calc Af Amer 55 (*) >90 (mL/min)  CBC      Component Value Range   WBC 7.6  4.0 - 10.5 (K/uL)   RBC 2.79 (*) 4.22 - 5.81 (MIL/uL)   Hemoglobin 8.8 (*) 13.0 - 17.0 (g/dL)   HCT 04.5 (*) 40.9 - 52.0 (%)   MCV 94.6  78.0 - 100.0 (fL)   MCH 31.5  26.0 - 34.0 (pg)   MCHC 33.3  30.0 - 36.0 (g/dL)   RDW 81.1 (*) 91.4 - 15.5 (%)   Platelets 83 (*) 150 - 400 (K/uL)  GLUCOSE, CAPILLARY      Component Value Range   Glucose-Capillary 136 (*) 70 - 99 (mg/dL)  GLUCOSE, CAPILLARY      Component Value Range   Glucose-Capillary  113 (*) 70 - 99 (mg/dL)    Discharge Medications:   Medication List  As of 07/06/2011  7:46 AM   TAKE these medications         aspirin EC 325 MG tablet   Take 325 mg by mouth at bedtime.      atorvastatin 40 MG tablet   Commonly known as: LIPITOR   Take 40 mg by mouth daily.      cilostazol 100 MG tablet   Commonly known as: PLETAL   Take 100 mg by mouth 2 (two) times daily.      donepezil 10 MG tablet   Commonly known as: ARICEPT   Take 10 mg by mouth at bedtime.      doxycycline 100 MG tablet   Commonly known as: VIBRA-TABS   Take 100 mg by mouth Twice daily.      FLOMAX 0.4 MG Caps   Generic drug: Tamsulosin HCl   Take 0.4 mg by mouth at bedtime.      metoprolol succinate 25 MG 24 hr tablet   Commonly known as: TOPROL-XL   Take 37.5 mg by mouth 2 (two) times daily.      multivitamin per tablet   Take 1 tablet by mouth daily.      nitroGLYCERIN 0.4 MG SL tablet   Commonly known as: NITROSTAT   Place 0.4 mg under the tongue every 5 (five) minutes as needed. For chest pain      PROBIOTIC FORMULA Caps   Take 356 each by mouth daily.      ramipril 5 MG tablet   Commonly known as: ALTACE   Take 5 mg by mouth daily.      TOVIAZ 4 MG Tb24   Generic drug:  fesoterodine   Take 4 mg by mouth at bedtime.      vitamin C 500 MG tablet   Commonly known as: ASCORBIC ACID   Take 500 mg by mouth 2 (two) times daily.      Vitamin D (Ergocalciferol) 50000 UNITS Caps   Commonly known as: DRISDOL   Take 50,000 Units by mouth every 7 (seven) days. Every tuesday      ZETIA 10 MG tablet   Generic drug: ezetimibe   Take 10 mg by mouth at bedtime.      zolpidem 10 MG tablet   Commonly known as: AMBIEN   Take 10 mg by mouth at bedtime as needed. For sleep            Diagnostic Studies: Dg Chest Port 1 View  07/04/2011  *RADIOLOGY REPORT*  Clinical Data: Laceration to but from lawnmower  PORTABLE CHEST - 1 VIEW  Comparison: 06/05/2011  Findings: Borderline cardiomegaly.   Pulmonary vascularity is within normal limits.  Lungs are clear.  No pneumothorax.  No acute bony deformity.  IMPRESSION: Borderline cardiomegaly without edema.  Original Report Authenticated By: Donavan Burnet, M.D.   Dg Foot Complete Left  07/04/2011  *RADIOLOGY REPORT*  Clinical Data: Laceration from lawnmower  LEFT FOOT - COMPLETE 3+ VIEW  Comparison: None.  Findings: The great toe has been amputated.  The tip of the first metatarsal is fractured and fragmented.  Bone fragments of the toe project over the soft tissues.  IMPRESSION: Great toe amputation and distal first metatarsal fracture.  Original Report Authenticated By: Donavan Burnet, M.D.    Disposition: 06-Home-Health Care Svc  Discharge Orders    Future Appointments: Provider: Department: Dept Phone: Center:   08/07/2011 10:30 AM Sherren Kerns, MD [facility] [phone] [facility]     Future Orders Please Complete By Expires   Diet - low sodium heart healthy      Call MD / Call 911      Comments:   If you experience chest pain or shortness of breath, CALL 911 and be transported to the hospital emergency room.  If you develope a fever above 101 F, pus (white drainage) or increased drainage or redness at the wound, or calf pain, call your surgeon's office.   Constipation Prevention      Comments:   Drink plenty of fluids.  Prune juice may be helpful.  You may use a stool softener, such as Colace (over the counter) 100 mg twice a day.  Use MiraLax (over the counter) for constipation as needed.   Increase activity slowly as tolerated      Weight Bearing as taught in Physical Therapy      Comments:   Use a walker or crutches as instructed.         SignedStandley Dakins B 07/06/2011, 7:46 AM

## 2011-07-06 NOTE — Progress Notes (Signed)
Orthopedics Progress Note  Subjective: Pt resting comfortably in no acute distress Minimal to no pain to left foot  Objective:  Filed Vitals:   07/06/11 0635  BP: 116/55  Pulse: 96  Temp: 101.4 F (38.6 C)  Resp: 16    General: Awake and alert  Musculoskeletal: left foot dressing intact. No drainage, nv intact distally Neurovascularly intact  Lab Results  Component Value Date   WBC 7.6 07/06/2011   HGB 8.8* 07/06/2011   HCT 26.4* 07/06/2011   MCV 94.6 07/06/2011   PLT 83* 07/06/2011       Component Value Date/Time   NA 138 07/06/2011 0513   K 3.6 07/06/2011 0513   CL 104 07/06/2011 0513   CO2 26 07/06/2011 0513   GLUCOSE 102* 07/06/2011 0513   BUN 14 07/06/2011 0513   CREATININE 1.33 07/06/2011 0513   CALCIUM 8.4 07/06/2011 0513   GFRNONAA 47* 07/06/2011 0513   GFRAA 55* 07/06/2011 0513    Lab Results  Component Value Date   INR 1.23 07/05/2011   INR 1.32 06/05/2011   INR 1.22 05/02/2011    Assessment/Plan: POD #2 s/p Procedure(s):left IRRIGATION AND DEBRIDEMENT EXTREMITY AMPUTATION FOOT  Continue IV antibiotics D/c planning probably tomorrow PT/OT  Almedia Balls. Ranell Patrick, MD 07/06/2011 7:11 AM

## 2011-07-06 NOTE — Progress Notes (Signed)
   CARE MANAGEMENT NOTE 07/06/2011  Patient:  [name], [name]   Account Number:  [id number]  Date Initiated:  07/06/2011  Documentation initiated by:  [facility]  Subjective/Objective Assessment:   Amputation, toe, traumatic     Action/Plan:   Anticipated DC Date:  07/06/2011   Anticipated DC Plan:  HOME W HOME HEALTH SERVICES      DC Planning Services  CM consult      [facility] Choice  HOME HEALTH   Choice offered to / List presented to:  C-1 Patient   DME arranged  Levan Hurst      DME agency  [facility]     HH arranged  HH-2 PT      [facility] agency  [facility]   Status of service:  Completed, signed off Medicare Important Message given?   (If response is "NO", the following Medicare IM given date fields will be blank) Date Medicare IM given:   Date Additional Medicare IM given:    Discharge Disposition:  HOME W HOME HEALTH SERVICES  Per UR Regulation:    If discussed at Long Length of Stay Meetings, dates discussed:    Comments:  07/06/2011 1400 Contacted [facility] for DME for scheduled d/c today. Request for [employer]. Faxed orders, F2F, d/c summary and facesheet to [place]. Isidoro Donning RN CCM Case Mgmt phone [phone]

## 2011-07-07 ENCOUNTER — Encounter (HOSPITAL_COMMUNITY): Payer: Self-pay | Admitting: Orthopedic Surgery

## 2011-08-06 ENCOUNTER — Encounter: Payer: Self-pay | Admitting: Vascular Surgery

## 2011-08-07 ENCOUNTER — Encounter (INDEPENDENT_AMBULATORY_CARE_PROVIDER_SITE_OTHER): Payer: Medicare Other | Admitting: *Deleted

## 2011-08-07 ENCOUNTER — Encounter: Payer: Self-pay | Admitting: Vascular Surgery

## 2011-08-07 ENCOUNTER — Ambulatory Visit (INDEPENDENT_AMBULATORY_CARE_PROVIDER_SITE_OTHER): Payer: Medicare Other | Admitting: Vascular Surgery

## 2011-08-07 ENCOUNTER — Ambulatory Visit (INDEPENDENT_AMBULATORY_CARE_PROVIDER_SITE_OTHER): Payer: Medicare Other | Admitting: *Deleted

## 2011-08-07 VITALS — BP 120/46 | HR 78 | Temp 98.3°F | Ht 64.0 in | Wt 124.0 lb

## 2011-08-07 DIAGNOSIS — L98499 Non-pressure chronic ulcer of skin of other sites with unspecified severity: Secondary | ICD-10-CM

## 2011-08-07 DIAGNOSIS — I739 Peripheral vascular disease, unspecified: Secondary | ICD-10-CM

## 2011-08-07 DIAGNOSIS — Z48812 Encounter for surgical aftercare following surgery on the circulatory system: Secondary | ICD-10-CM

## 2011-08-07 NOTE — Progress Notes (Signed)
Patient is an 78 year old male who returns for followup today status post axillary bifemoral bypass and right femoropopliteal bypass. Subsequent to his last visit, he ran over his left foot with a lawn mower. He underwent a left first toe amputation by Dr. Despina Hick. Otherwise he is doing well. He continues to take doxycycline twice daily. He has lost some weight and his friend states that his appetite has been off.  Physical exam:  Filed Vitals:   08/07/11 1053  BP: 120/46  Pulse: 78  Temp: 98.3 F (36.8 C)  TempSrc: Oral  Height: 5\' 4"  (1.626 m)  Weight: 124 lb (56.246 kg)  SpO2: 100%   Right chest wall incisions are all completely healed at this point. He has a pulse in the axillary femoral bypass graft. There is a pulse in the femoral femoral bypass graft. Feet are pink and warm bilaterally. The toe amputation site is healing on the left foot. Although there are still some areas of slight separation of the skin incision that had not completely healed.  Data: Patient had a graft duplex exam today which showed that the right femoropopliteal bypass is patent. The axillary bifemoral bypass is patent. He did have slightly increased velocities in the 250 cm/s range in the left groin.  ABI was 0.4 on the left and 0.8 on the right  Assessment:  Patent axillary bifemoral bypass and right femoropopliteal bypass with healing left first toe amputation  Plan: Repeat graft duplex in 3 months time. If the wound in his left foot continues to deteriorate we would need to consider whether or not a left femoropopliteal bypass would be in his best interest. Hopefully this will heal without any intervention.  Fabienne Bruns, MD [facility] Office: [phone] Pager: [phone]

## 2011-08-07 NOTE — Addendum Note (Signed)
Addended by: Sharee Pimple on: 08/07/2011 01:40 PM   Modules accepted: Orders

## 2011-08-15 ENCOUNTER — Other Ambulatory Visit: Payer: Self-pay | Admitting: *Deleted

## 2011-08-15 DIAGNOSIS — I739 Peripheral vascular disease, unspecified: Secondary | ICD-10-CM

## 2011-08-15 DIAGNOSIS — Z48812 Encounter for surgical aftercare following surgery on the circulatory system: Secondary | ICD-10-CM

## 2011-08-18 ENCOUNTER — Encounter: Payer: Self-pay | Admitting: Vascular Surgery

## 2011-08-18 NOTE — Procedures (Unsigned)
BYPASS GRAFT EVALUATION  INDICATION:  Follow up recent multiple interventions.  HISTORY: Diabetes:  Yes Cardiac: Hypertension:  Yes Smoking:  Previous Previous Surgery:  Bilateral common femoral artery endarterectomies, right axillary to femoral and right to left femoral to femoral bypass grafts and right popliteal endarterectomy, all performed March 2013. Replacement of axillary limb graft April 2013 due to infection.  SINGLE LEVEL ARTERIAL EXAM                              RIGHT              LEFT Brachial: Anterior tibial: Posterior tibial: Peroneal: Ankle/brachial index:        0.82               0.39  PREVIOUS ABI:  Date: 03/06/2011  RIGHT:  0.44  LEFT:  0.36  LOWER EXTREMITY BYPASS GRAFT DUPLEX EXAM:  DUPLEX:  Patent right axillary to femoral, right to left femoral to femoral bypass graft and right femoral to popliteal bypass graft with elevated velocities noted at the distal anastomosis of the right to left femoral to femoral graft reaching 250 cm/sec.  IMPRESSION: 1. Patent right axillary to femoral graft.  Patent right to left     femoral to femoral graft.  Patent right femoral to popliteal bypass     graft. 2. Increased velocities noted at the distal anastomosis of the right     to left femoral to femoral graft. 3. Right ABIs are suggestive of mild arterial disease. 4. Left ABIs are suggestive of severe arterial disease. 5. Please see the following diagram for exam details.  ___________________________________________ Janetta Hora Fields, MD  EM/MEDQ  D:  08/07/2011  T:  08/07/2011  Job:  [id number]

## 2011-11-11 ENCOUNTER — Encounter: Payer: Self-pay | Admitting: Neurosurgery

## 2011-11-12 ENCOUNTER — Ambulatory Visit (INDEPENDENT_AMBULATORY_CARE_PROVIDER_SITE_OTHER): Payer: Medicare Other | Admitting: Neurosurgery

## 2011-11-12 ENCOUNTER — Encounter (INDEPENDENT_AMBULATORY_CARE_PROVIDER_SITE_OTHER): Payer: Medicare Other | Admitting: *Deleted

## 2011-11-12 ENCOUNTER — Ambulatory Visit: Payer: Medicare Other | Admitting: Neurosurgery

## 2011-11-12 ENCOUNTER — Encounter: Payer: Self-pay | Admitting: Neurosurgery

## 2011-11-12 VITALS — BP 173/66 | HR 53 | Resp 16 | Ht 64.5 in | Wt 130.6 lb

## 2011-11-12 DIAGNOSIS — Z48812 Encounter for surgical aftercare following surgery on the circulatory system: Secondary | ICD-10-CM

## 2011-11-12 DIAGNOSIS — I739 Peripheral vascular disease, unspecified: Secondary | ICD-10-CM

## 2011-11-12 NOTE — Progress Notes (Signed)
[facility] [place] PAD/PVD Office Note  CC: PVD surveillance Referring Physician: Fields  History of Present Illness: 78 year old male patient of Dr. Darrick Penna status post axillary bifemoral bypass and right femoropopliteal bypass. The patient did have a left great amputation which is completely healed. The patient states he can walk without difficulty and has no complaints of claudication, rest pain or any other open ulcerations.  Past Medical History  Diagnosis Date  . Hyperlipidemia     takes Atorvastatin daily  . CAD (coronary artery disease)   . Gastritis   . BPH (benign prostatic hypertrophy)     Dr.Nesi is urologist  . Arthritis   . Claudication   . Hypertension     takes Ramipril and Metoprolol daily  . Heart murmur   . Asthma     as a young child  . Bruises easily     takes Pletal and ASA daily  . Colitis     hx of  . Hx of colonic polyps   . Urinary urgency     takes Flomax daily  . Diabetes mellitus     borderline diabetic  . Insomnia     takes Ambien nightly  . History of shingles 3-41yrs ago  . Blind right eye     "since age 37-4; S/P whooping cough"  . Mental disorder     takes Aricept daily  . DEMENTIA   . Peripheral vascular disease   . Amputation of great toe, left, traumatic     ROS: [x]  Positive   [ ]  Denies    General: [ ]  Weight loss, [ ]  Fever, [ ]  chills Neurologic: [ ]  Dizziness, [ ]  Blackouts, [ ]  Seizure [ ]  Stroke, [ ]  "Mini stroke", [ ]  Slurred speech, [ ]  Temporary blindness; [ ]  weakness in arms or legs, [ ]  Hoarseness Cardiac: [ ]  Chest pain/pressure, [ ]  Shortness of breath at rest [ ]  Shortness of breath with exertion, [ ]  Atrial fibrillation or irregular heartbeat Vascular: [ ]  Pain in legs with walking, [ ]  Pain in legs at rest, [ ]  Pain in legs at night,  [ ]  Non-healing ulcer, [ ]  Blood clot in vein/DVT,   Pulmonary: [ ]  Home oxygen, [ ]  Productive cough, [ ]  Coughing up blood, [ ]  Asthma,  [ ]   Wheezing Musculoskeletal:  [ ]  Arthritis, [ ]  Low back pain, [ ]  Joint pain Hematologic: [ ]  Easy Bruising, [ ]  Anemia; [ ]  Hepatitis Gastrointestinal: [ ]  Blood in stool, [ ]  Gastroesophageal Reflux/heartburn, [ ]  Trouble swallowing Urinary: [ ]  chronic Kidney disease, [ ]  on HD - [ ]  MWF or [ ]  TTHS, [ ]  Burning with urination, [ ]  Difficulty urinating Skin: [ ]  Rashes, [ ]  Wounds Psychological: [ ]  Anxiety, [ ]  Depression   Social History History  Substance Use Topics  . Smoking status: Former Smoker    Types: Cigarettes    Quit date: 02/25/1940  . Smokeless tobacco: Never Used  . Alcohol Use: No     "last alcohol was ~ 1950's"    Family History Family History  Problem Relation Age of Onset  . Heart disease Father   . Anesthesia problems Neg Hx   . Hypotension Neg Hx   . Malignant hyperthermia Neg Hx   . Pseudochol deficiency Neg Hx     Allergies  Allergen Reactions  . Shrimp (Shellfish Allergy) Nausea And Vomiting    "haven't eaten any since 1980's" pt only allergic to  shrimp. Not shellfish    Current Outpatient Prescriptions  Medication Sig Dispense Refill  . aspirin EC 325 MG tablet Take 325 mg by mouth at bedtime.       Marland Kitchen atorvastatin (LIPITOR) 40 MG tablet Take 40 mg by mouth daily.      . cilostazol (PLETAL) 100 MG tablet Take 100 mg by mouth 2 (two) times daily.       Marland Kitchen donepezil (ARICEPT) 10 MG tablet Take 10 mg by mouth at bedtime.       Marland Kitchen doxycycline (VIBRA-TABS) 100 MG tablet Take 100 mg by mouth Twice daily.      Marland Kitchen ezetimibe (ZETIA) 10 MG tablet Take 10 mg by mouth at bedtime.        . fesoterodine (TOVIAZ) 4 MG TB24 Take 4 mg by mouth at bedtime.       . megestrol (MEGACE) 40 MG/ML suspension       . metoprolol succinate (TOPROL-XL) 25 MG 24 hr tablet Take 37.5 mg by mouth 2 (two) times daily.       . multivitamin (THERAGRAN) per tablet Take 1 tablet by mouth daily.      . nitroGLYCERIN (NITROSTAT) 0.4 MG SL tablet Place 0.4 mg under the tongue every 5  (five) minutes as needed. For chest pain      . oxybutynin (DITROPAN-XL) 10 MG 24 hr tablet Take 10 mg by mouth daily.      Marland Kitchen oxyCODONE-acetaminophen (PERCOCET) 5-325 MG per tablet Take 5 tablets by mouth as needed.      Marland Kitchen PARoxetine (PAXIL) 10 MG tablet       . Probiotic Product (PROBIOTIC FORMULA) CAPS Take 356 each by mouth daily.      . ramipril (ALTACE) 5 MG tablet Take 5 mg by mouth daily.        . Tamsulosin HCl (FLOMAX) 0.4 MG CAPS Take 0.4 mg by mouth at bedtime.       . vitamin C (ASCORBIC ACID) 500 MG tablet Take 500 mg by mouth 2 (two) times daily.      . Vitamin D, Ergocalciferol, (DRISDOL) 50000 UNITS CAPS Take 50,000 Units by mouth every 7 (seven) days. Every tuesday      . zolpidem (AMBIEN) 10 MG tablet Take 10 mg by mouth at bedtime as needed. For sleep      . DISCONTD: fluvastatin XL (LESCOL XL) 80 MG 24 hr tablet Take 80 mg by mouth at bedtime.          Physical Examination  Filed Vitals:   11/12/11 1352  BP: 173/66  Pulse: 53  Resp: 16    Body mass index is 22.07 kg/(m^2).  General:  WDWN in NAD Gait: Normal HEENT: WNL Eyes: Pupils equal Pulmonary: normal non-labored breathing , without Rales, rhonchi,  wheezing Cardiac: RRR, without  Murmurs, rubs or gallops; No carotid bruits Abdomen: soft, NT, no masses Skin: no rashes, ulcers noted Vascular Exam/Pulses: Left lower extremity pulses palpable he does have a palpable femoral pulse on the right, 3+ radial pulses left foot is well perfused and surgical wound is well healed  Extremities without ischemic changes, no Gangrene , no cellulitis; no open wounds;  Musculoskeletal: no muscle wasting or atrophy  Neurologic: A&O X 3; Appropriate Affect ; SENSATION: normal; MOTOR FUNCTION:  moving all extremities equally. Speech is fluent/normal  Non-Invasive Vascular Imaging: ABIs today are 0.89 and biphasic on the right, 0.42 and monophasic on the left which is virtually unchanged from previous exam. Bypass graft  evaluation shows a patent bypass with elevations in the left femoral of 277 and 354 which is slightly increase from previous exam we will continue to watch this.  ASSESSMENT/PLAN: This is a patient status post the above procedures with no complaints, he will followup in 3 months with repeat duplex and ABIs and see Dr. Darrick Penna at that time. The patient's in agreement with this, his questions were encouraged and answered.  Lauree Chandler ANP next  Clinic M.D.: Hart Rochester on call

## 2011-11-12 NOTE — Addendum Note (Signed)
Addended by: Sharee Pimple on: 11/12/2011 02:57 PM   Modules accepted: Orders

## 2012-02-06 ENCOUNTER — Ambulatory Visit: Payer: Medicare Other | Admitting: Neurosurgery

## 2012-02-12 ENCOUNTER — Ambulatory Visit: Payer: Medicare Other | Admitting: Vascular Surgery

## 2012-08-04 ENCOUNTER — Other Ambulatory Visit: Payer: Self-pay | Admitting: Internal Medicine

## 2012-08-04 DIAGNOSIS — E041 Nontoxic single thyroid nodule: Secondary | ICD-10-CM

## 2012-08-06 ENCOUNTER — Ambulatory Visit
Admission: RE | Admit: 2012-08-06 | Discharge: 2012-08-06 | Disposition: A | Discharge status: Home / Self Care | Payer: Medicare Other | Source: Ambulatory Visit | Attending: Internal Medicine | Admitting: Internal Medicine

## 2012-08-06 DIAGNOSIS — E041 Nontoxic single thyroid nodule: Secondary | ICD-10-CM

## 2012-11-12 ENCOUNTER — Encounter: Payer: Self-pay | Admitting: *Deleted

## 2012-11-24 ENCOUNTER — Encounter: Payer: Self-pay | Admitting: Interventional Cardiology

## 2012-11-24 ENCOUNTER — Ambulatory Visit (INDEPENDENT_AMBULATORY_CARE_PROVIDER_SITE_OTHER): Payer: Medicare Other | Admitting: Interventional Cardiology

## 2012-11-24 VITALS — BP 140/50 | HR 58 | Ht 65.0 in | Wt 129.0 lb

## 2012-11-24 DIAGNOSIS — I25810 Atherosclerosis of coronary artery bypass graft(s) without angina pectoris: Secondary | ICD-10-CM

## 2012-11-24 DIAGNOSIS — I739 Peripheral vascular disease, unspecified: Secondary | ICD-10-CM

## 2012-11-24 DIAGNOSIS — Z952 Presence of prosthetic heart valve: Secondary | ICD-10-CM

## 2012-11-24 DIAGNOSIS — Z953 Presence of xenogenic heart valve: Secondary | ICD-10-CM | POA: Insufficient documentation

## 2012-11-24 NOTE — Progress Notes (Signed)
Patient ID: [name]., male   DOB: 21-Oct-1924, 79 y.o.   MRN: [id number]  [address], [address] [place], [place]  [address] Phone: [phone] Fax:  [phone]  Date:  11/24/2012   ID:  [name]., DOB [birth year], MRN [id number]  PCP:  Dorrene German, MD     History of Present Illness: [name]. is a 79 y.o. male with history of peripheral vascular disease, and CAD. He is becoming increasingly forgetful. He has not had chest pain or dyspnea. He has difficulty ambulating because of hip arthritis and PAD. He denies palpitations, transient neurological complaints, and syncope. Overall, he feels well, but his wife complains of minimal status changes.   Wt Readings from Last 3 Encounters:  11/24/12 129 lb (58.514 kg)  11/12/11 130 lb 9.6 oz (59.24 kg)  08/07/11 124 lb (56.246 kg)     Past Medical History  Diagnosis Date  . Hyperlipidemia     takes Atorvastatin daily  . CAD (coronary artery disease)   . Gastritis   . BPH (benign prostatic hypertrophy)     Dr.Nesi is urologist  . Arthritis   . Claudication   . Hypertension     takes Ramipril and Metoprolol daily  . Heart murmur   . Asthma     as a young child  . Bruises easily     takes Pletal and ASA daily  . Colitis     hx of  . Hx of colonic polyps   . Urinary urgency     takes Flomax daily  . Diabetes mellitus     borderline diabetic  . Insomnia     takes Ambien nightly  . History of shingles 3-62yrs ago  . Blind right eye     "since age 46-4; S/P whooping cough"  . Mental disorder     takes Aricept daily  . DEMENTIA   . Peripheral vascular disease   . Amputation of great toe, left, traumatic   . Palpitations     we need to exclude atrial fib and ventricular arrhythmia.    Current Outpatient Prescriptions  Medication Sig Dispense Refill  . aspirin EC 325 MG tablet Take 325 mg by mouth at bedtime.       Marland Kitchen atorvastatin (LIPITOR) 40 MG tablet Take 40 mg by mouth daily.      .  cilostazol (PLETAL) 100 MG tablet Take 100 mg by mouth 2 (two) times daily.       Marland Kitchen donepezil (ARICEPT) 10 MG tablet Take 10 mg by mouth at bedtime.       Marland Kitchen doxycycline (VIBRA-TABS) 100 MG tablet Take 100 mg by mouth Twice daily.      Marland Kitchen ezetimibe (ZETIA) 10 MG tablet Take 10 mg by mouth at bedtime.        . fesoterodine (TOVIAZ) 4 MG TB24 Take 4 mg by mouth at bedtime.       . megestrol (MEGACE) 40 MG/ML suspension as directed.       . metoprolol succinate (TOPROL-XL) 25 MG 24 hr tablet Take 37.5 mg by mouth 2 (two) times daily.       . nitroGLYCERIN (NITROSTAT) 0.4 MG SL tablet Place 0.4 mg under the tongue every 5 (five) minutes as needed. For chest pain      . oxybutynin (DITROPAN-XL) 10 MG 24 hr tablet Take 10 mg by mouth daily.      Marland Kitchen oxyCODONE-acetaminophen (PERCOCET) 5-325 MG per tablet Take 5  tablets by mouth as needed.      Marland Kitchen PARoxetine (PAXIL) 10 MG tablet       . Probiotic Product (PROBIOTIC FORMULA) CAPS Take 356 each by mouth daily.      . ramipril (ALTACE) 5 MG tablet Take 5 mg by mouth daily.        . Tamsulosin HCl (FLOMAX) 0.4 MG CAPS Take 0.4 mg by mouth at bedtime.       . vitamin C (ASCORBIC ACID) 500 MG tablet Take 500 mg by mouth 2 (two) times daily.      . Vitamin D, Ergocalciferol, (DRISDOL) 50000 UNITS CAPS Take 50,000 Units by mouth every 7 (seven) days. Every tuesday      . zolpidem (AMBIEN) 10 MG tablet Take 10 mg by mouth at bedtime as needed. For sleep      . [DISCONTINUED] fluvastatin XL (LESCOL XL) 80 MG 24 hr tablet Take 80 mg by mouth at bedtime.         No current facility-administered medications for this visit.    Allergies:    Allergies  Allergen Reactions  . Shrimp [Shellfish Allergy] Nausea And Vomiting    "haven't eaten any since 1980's" pt only allergic to shrimp. Not shellfish    Social History:  The patient  reports that he quit smoking about 72 years ago. His smoking use included Cigarettes. He smoked 0.00 packs per day. He has never used  smokeless tobacco. He reports that he does not drink alcohol or use illicit drugs.   ROS:  Please see the history of present illness.   No skin ulcers.   All other systems reviewed and negative.   PHYSICAL EXAM: VS:  BP 140/50  Pulse 58  Ht 5\' 5"  (1.651 m)  Wt 129 lb (58.514 kg)  BMI 21.47 kg/m2 Well nourished, well developed, in no acute distress HEENT: normal Neck: no JVD Cardiac:  normal S1, S2; RRR; no murmur Lungs:  clear to auscultation bilaterally, no wheezing, rhonchi or rales Abd: soft, nontender, no hepatomegaly Ext: no edema Skin: warm and dry Neuro:  CNs 2-12 intact, no focal abnormalities noted  EKG:  Normal sinus rhythm/bradycardia with right bundle and left anterior hemiblock. No change from prior.     ASSESSMENT AND PLAN:  1. Coronary atherosclerotic heart disease, asymptomatic. No action is needed. 2. Bioprosthetic aortic valve, without evidence of dysfunction. No action needed 3. PAD with limited ambulation. This is a chronic problem. 4. Memory deficit. This is becoming the patient's major clinical problem. It limits their ability to come in for office visits. We'll plan on seeing the patient on an as-needed basis.  Signed, Alanda Amass Leia Alf, MD 11/24/2012 1:04 PM

## 2012-11-24 NOTE — Patient Instructions (Addendum)
Your physician recommends that you schedule a follow-up appointment in: AS NEEDED  

## 2013-04-11 ENCOUNTER — Encounter: Payer: Self-pay | Admitting: Neurology

## 2013-05-05 ENCOUNTER — Other Ambulatory Visit: Payer: Self-pay | Admitting: *Deleted

## 2013-05-05 MED ORDER — METOPROLOL SUCCINATE ER 25 MG PO TB24: 37.5000 mg | ORAL_TABLET | Freq: Two times a day (BID) | ORAL | Status: DC

## 2013-05-18 ENCOUNTER — Encounter: Payer: Self-pay | Admitting: Diagnostic Neuroimaging

## 2013-05-18 ENCOUNTER — Ambulatory Visit (INDEPENDENT_AMBULATORY_CARE_PROVIDER_SITE_OTHER): Payer: Medicare Other | Admitting: Diagnostic Neuroimaging

## 2013-05-18 ENCOUNTER — Encounter (INDEPENDENT_AMBULATORY_CARE_PROVIDER_SITE_OTHER): Payer: Self-pay

## 2013-05-18 ENCOUNTER — Ambulatory Visit: Payer: Medicare Other | Admitting: Neurology

## 2013-05-18 VITALS — BP 151/73 | HR 81 | Temp 98.2°F | Ht 64.0 in | Wt 134.0 lb

## 2013-05-18 DIAGNOSIS — G3183 Dementia with Lewy bodies: Principal | ICD-10-CM

## 2013-05-18 DIAGNOSIS — F0280 Dementia in other diseases classified elsewhere without behavioral disturbance: Secondary | ICD-10-CM

## 2013-05-18 NOTE — Patient Instructions (Signed)
No driving.  Monitor patient's medications, finances, hygeine.  I will order home health agency referral.
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
## 2013-05-24 ENCOUNTER — Telehealth: Payer: Self-pay | Admitting: Diagnostic Neuroimaging

## 2013-05-24 NOTE — Telephone Encounter (Signed)
Patient's wife calling to state that at patient's last visit with Dr. Marjory LiesPenumalli, home health care referral was mentioned. Patient's wife is calling to check on the status of that since they have not heard anything yet. Please call and advise.

## 2013-05-25 NOTE — Telephone Encounter (Signed)
Pt's information was given to Dr. Richrd HumblesPenumalli's assistant, Tad Mooreasandra, CMA.

## 2013-06-02 ENCOUNTER — Telehealth: Payer: Self-pay | Admitting: Interventional Cardiology

## 2013-06-02 DIAGNOSIS — G3183 Dementia with Lewy bodies: Secondary | ICD-10-CM

## 2013-06-02 DIAGNOSIS — F0280 Dementia in other diseases classified elsewhere without behavioral disturbance: Secondary | ICD-10-CM

## 2013-06-02 NOTE — Telephone Encounter (Signed)
I spoke with Akiko the physical therapist in the pt's home and she wanted to clarify the pt's medications.  The pt is not taking Cilostazol 100mg  and the pharmacy said this medication has never been filled.  They cannot locate Metoprolol in the home but the pharmacy said the pt did pick up a supply of this medication.  The pt has memory issues and he has been managing his own medications.   The wife is unsure if he has been taking this medication. Physical therapy has been ordered by Neurology.  PT would like to know if Dr Katrinka BlazingSmith would like to order a home health nurse to come into the home to help with medication management.  BP today 140/76, pulse 60.  Akiko would like to also know what should be done in regards to cilostazol and metoprolol.  I will forward this message to Dr Katrinka BlazingSmith to review and make further recommendations.  Akiko would like to be contacted at [phone].

## 2013-06-02 NOTE — Telephone Encounter (Signed)
New message     Need to verify medications patient is on. Please call wife--christine at [phone] or [phone] to verify what medications pt is to be on.  Also, please call the [facility] and give her a verbal order to continue to see Mr [name].

## 2013-06-06 ENCOUNTER — Telehealth: Payer: Self-pay | Admitting: Diagnostic Neuroimaging

## 2013-06-06 NOTE — Telephone Encounter (Signed)
Calling to report the patient had a fall on Saturday 06/04/13 trying to climb up the wall on the flower bed. Doesn't need a call back just wants to report. The patient does complain of any pain and has an appt with PCP already.

## 2013-06-07 ENCOUNTER — Telehealth: Payer: Self-pay | Admitting: Diagnostic Neuroimaging

## 2013-06-07 NOTE — Telephone Encounter (Signed)
Follow Up  Akiko called to follow Up on the pt's medication. Please call

## 2013-06-07 NOTE — Telephone Encounter (Signed)
[facility] physical therapy calling to request verbal order for nursing services.

## 2013-06-07 NOTE — Telephone Encounter (Signed)
[facility]adv her that Dr.Smith would not be in the office until next week.adv that we were waiting on Dr.Smith to respond.adv her that Metoprolol was lisated on pt last o/v and in ecw 37.5mg  bid.adv her that I would call back once confirmed with Dr.Smith.she verbalized understanding

## 2013-06-07 NOTE — Telephone Encounter (Signed)
Noted  

## 2013-06-08 NOTE — Telephone Encounter (Signed)
Spoke with physical therapist and she said that patient is having a hard time managing her medicines, confused. Would like patient to have someone to come in to help organize

## 2013-06-13 ENCOUNTER — Telehealth: Payer: Self-pay | Admitting: Diagnostic Neuroimaging

## 2013-06-13 DIAGNOSIS — F0280 Dementia in other diseases classified elsewhere without behavioral disturbance: Secondary | ICD-10-CM

## 2013-06-13 DIAGNOSIS — G3183 Dementia with Lewy bodies: Principal | ICD-10-CM

## 2013-06-13 NOTE — Telephone Encounter (Signed)
[facility] calling requesting a order be put in for bedside commode 3 in 1 for the pt. Please advise

## 2013-06-13 NOTE — Telephone Encounter (Signed)
Information was given to [place], RN, Dr. Richrd HumblesPenumalli's nurse. Please advise

## 2013-06-13 NOTE — Telephone Encounter (Signed)
Nedra HaiLee from [facility] calling to request a verbal order for bedside commode 3 in 1 for patient, he also faxed us something and wants to know if we received it. Please call Nedra HaiLee at his direct extension.

## 2013-06-13 NOTE — Telephone Encounter (Signed)
Order placed and to be signed per Dr. Marjory LiesPenumalli.

## 2013-06-13 NOTE — Telephone Encounter (Signed)
pls go ahead with order. -VRP 

## 2013-06-13 NOTE — Telephone Encounter (Signed)
Ok to go ahead with nursing order. -VRP

## 2013-06-21 ENCOUNTER — Telehealth: Payer: Self-pay | Admitting: Interventional Cardiology

## 2013-06-21 NOTE — Telephone Encounter (Signed)
New message     According to his medication list, he is supposed to be on pletal. But, he does not have a presc for this medication.  Please ask Dr Katrinka BlazingSmith if he is to be on this medication and call [facility] Anaracy.

## 2013-06-21 NOTE — Telephone Encounter (Signed)
returned pt call.adv her that medications was not prescribed by Dr.Smith, she should f/u with the physician that treats his pvd.she verbalized understanding.

## 2013-07-18 ENCOUNTER — Other Ambulatory Visit: Payer: Self-pay | Admitting: *Deleted

## 2013-07-18 DIAGNOSIS — E785 Hyperlipidemia, unspecified: Secondary | ICD-10-CM

## 2013-07-20 ENCOUNTER — Telehealth: Payer: Self-pay | Admitting: Diagnostic Neuroimaging

## 2013-07-20 NOTE — Telephone Encounter (Signed)
Lynnette with [facility] in [place] calling to verify if we received [facility] Certification form faxed over on 06/08/13. Please call and advise.  [phone].

## 2013-07-22 NOTE — Telephone Encounter (Signed)
Called Lynnette with [facility] and left message stating that we could not locate the form and if she could refax it back over to our office again at [phone] and if she has any other problems, questions or concerns to call the office.

## 2013-08-03 ENCOUNTER — Other Ambulatory Visit (INDEPENDENT_AMBULATORY_CARE_PROVIDER_SITE_OTHER): Payer: Medicare Other

## 2013-08-03 DIAGNOSIS — E785 Hyperlipidemia, unspecified: Secondary | ICD-10-CM

## 2013-08-03 LAB — HEPATIC FUNCTION PANEL
ALK PHOS: 76 U/L (ref 39–117)
ALT: 14 U/L (ref 0–53)
AST: 20 U/L (ref 0–37)
Albumin: 3.4 g/dL — ABNORMAL LOW (ref 3.5–5.2)
BILIRUBIN DIRECT: 0.1 mg/dL (ref 0.0–0.3)
Total Bilirubin: 0.5 mg/dL (ref 0.2–1.2)
Total Protein: 6.4 g/dL (ref 6.0–8.3)

## 2013-08-03 LAB — LIPID PANEL
CHOLESTEROL: 120 mg/dL (ref 0–200)
HDL: 46.4 mg/dL (ref >39.00)
LDL Cholesterol: 60 mg/dL (ref 0–99)
NonHDL: 73.6
TRIGLYCERIDES: 67 mg/dL (ref 0.0–149.0)
Total CHOL/HDL Ratio: 3
VLDL: 13.4 mg/dL (ref 0.0–40.0)

## 2013-08-05 ENCOUNTER — Telehealth: Payer: Self-pay | Admitting: *Deleted

## 2013-08-05 ENCOUNTER — Telehealth: Payer: Self-pay | Admitting: Interventional Cardiology

## 2013-08-05 NOTE — Telephone Encounter (Signed)
lmtcb for lab results 08-05-13 at 1:14pm

## 2013-08-05 NOTE — Telephone Encounter (Signed)
Notified of lab results. 

## 2013-08-05 NOTE — Telephone Encounter (Signed)
New message    Calling for test results  

## 2013-09-16 ENCOUNTER — Encounter: Payer: Self-pay | Admitting: *Deleted

## 2013-09-19 ENCOUNTER — Ambulatory Visit (INDEPENDENT_AMBULATORY_CARE_PROVIDER_SITE_OTHER): Payer: Medicare Other | Admitting: Diagnostic Neuroimaging

## 2013-09-19 ENCOUNTER — Encounter: Payer: Self-pay | Admitting: Diagnostic Neuroimaging

## 2013-09-19 VITALS — BP 150/59 | HR 56 | Ht 64.0 in | Wt 133.0 lb

## 2013-09-19 DIAGNOSIS — F0280 Dementia in other diseases classified elsewhere without behavioral disturbance: Secondary | ICD-10-CM

## 2013-09-19 DIAGNOSIS — G3183 Dementia with Lewy bodies: Principal | ICD-10-CM

## 2013-09-19 NOTE — Patient Instructions (Signed)
Continue current medications. 
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
## 2013-09-20 ENCOUNTER — Other Ambulatory Visit: Payer: Self-pay | Admitting: Interventional Cardiology

## 2013-09-29 NOTE — Telephone Encounter (Signed)
Noted  

## 2013-10-13 ENCOUNTER — Other Ambulatory Visit: Payer: Self-pay | Admitting: *Deleted

## 2013-10-13 MED ORDER — METOPROLOL SUCCINATE ER 25 MG PO TB24: 37.5000 mg | ORAL_TABLET | Freq: Two times a day (BID) | ORAL | Status: DC

## 2013-10-18 ENCOUNTER — Other Ambulatory Visit: Payer: Self-pay

## 2013-10-29 ENCOUNTER — Other Ambulatory Visit: Payer: Self-pay | Admitting: Interventional Cardiology

## 2013-12-24 ENCOUNTER — Other Ambulatory Visit: Payer: Self-pay

## 2013-12-24 MED ORDER — EZETIMIBE 10 MG PO TABS: ORAL_TABLET | ORAL | Status: DC

## 2014-02-26 IMAGING — CR DG CHEST 1V PORT
1 series · 1 of 1 positions shown · non-contrast
Comparison: 05/02/2011.

CLINICAL DATA: Intubated and central line placement.

PORTABLE CHEST - 1 VIEW

[view not recorded]
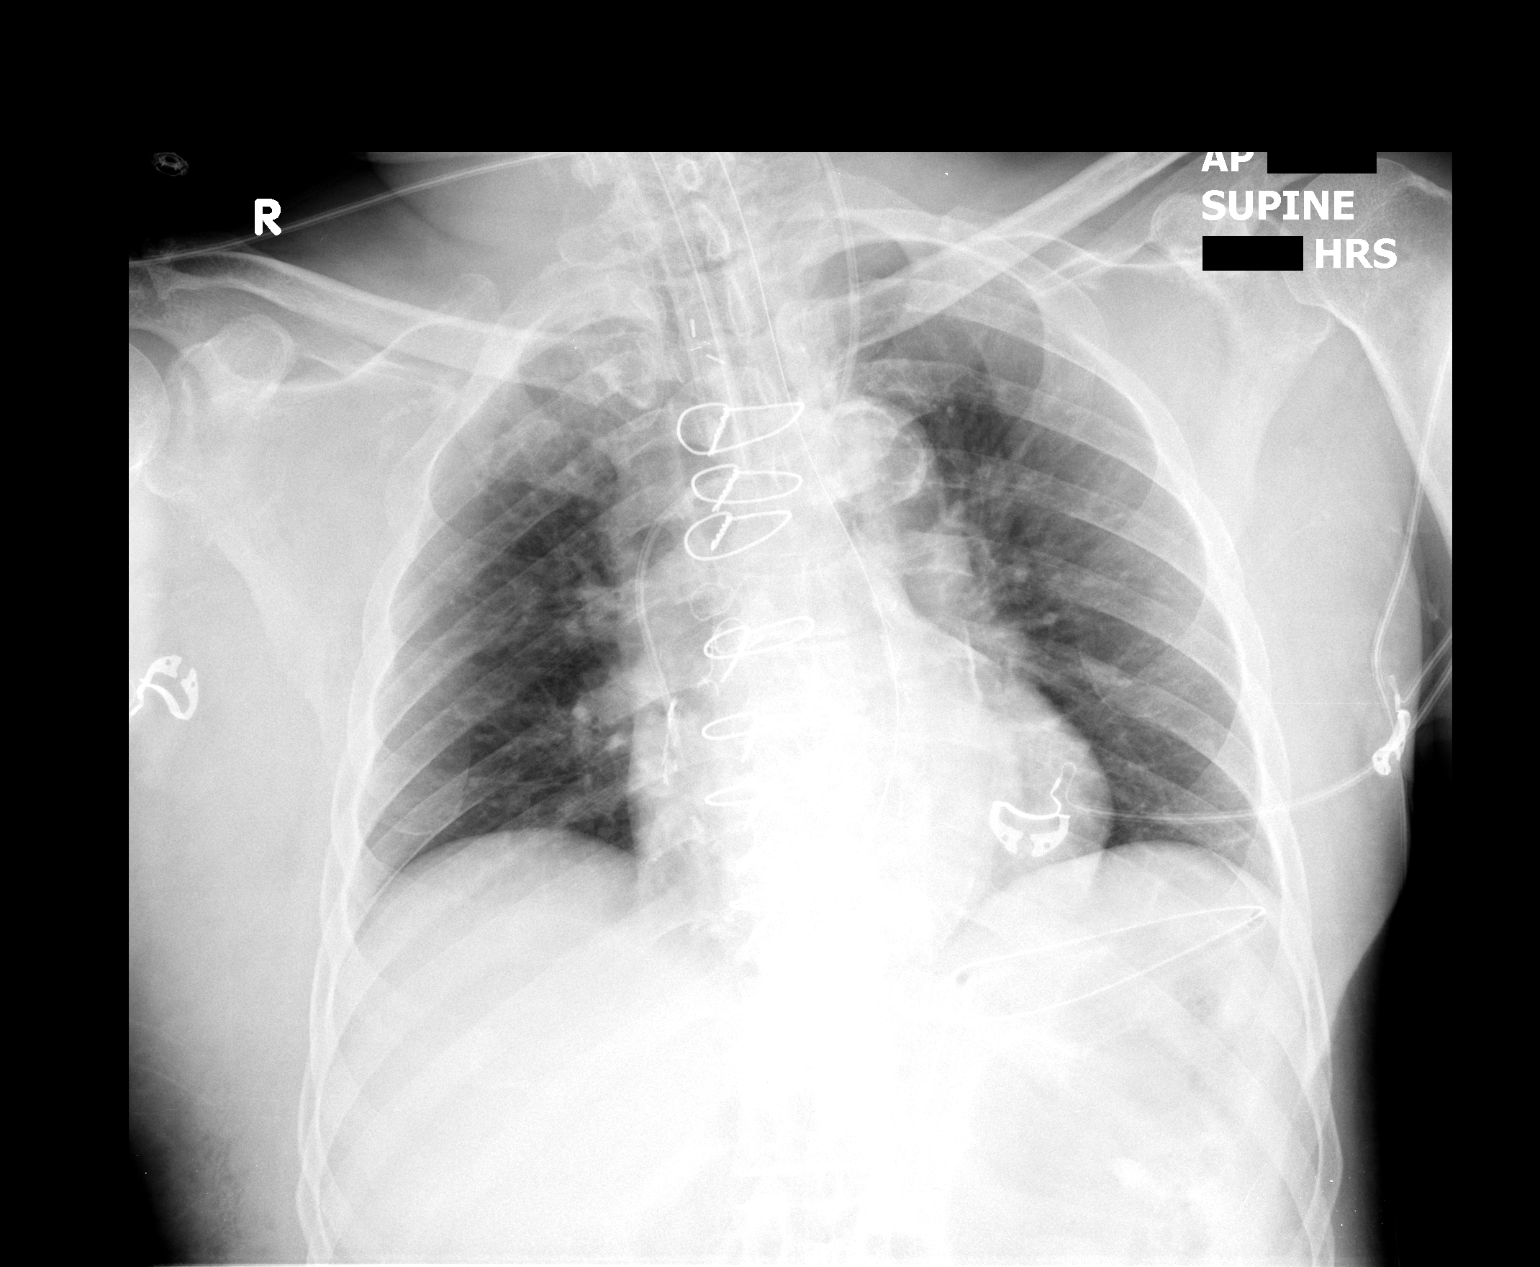

[1 of 1 positions shown; findings below may reference images not displayed]

FINDINGS: Interval endotracheal tube with its tip 3 cm above the
carina. Interval left jugular catheter with its tip at the junction
of the superior vena cava and right atrium.  No pneumothorax.
Borderline enlarged cardiac silhouette.  The previously
demonstrated rounded calcification at the lateral right lung apex
is shown to lie outside of the chest on today's view, rotated to
the right, compatible with arterial atheromatous calcification.
There is similar calcification overlying the medial right lung apex
on this view.  Mild diffuse peribronchial thickening.  Nasogastric
tube tip in the mid stomach.  Prosthetic heart valve.
IMPRESSION: 1.  Endotracheal tube and left jugular catheter in satisfactory
position.
2.  Mild bronchitic changes and borderline cardiomegaly.
3.  Arterial atheromatous calcifications overlying the right lung
apex.

## 2014-02-27 IMAGING — CR DG CHEST 1V PORT
1 series · 2 of 2 positions shown · non-contrast
Comparison: 05/05/2011

CLINICAL DATA: Assess pleural effusions, shortness of breath.

PORTABLE CHEST - 1 VIEW

[Series 1: AP · 0.16mm/px · 2 of 2 slices shown]
[im 1/2]
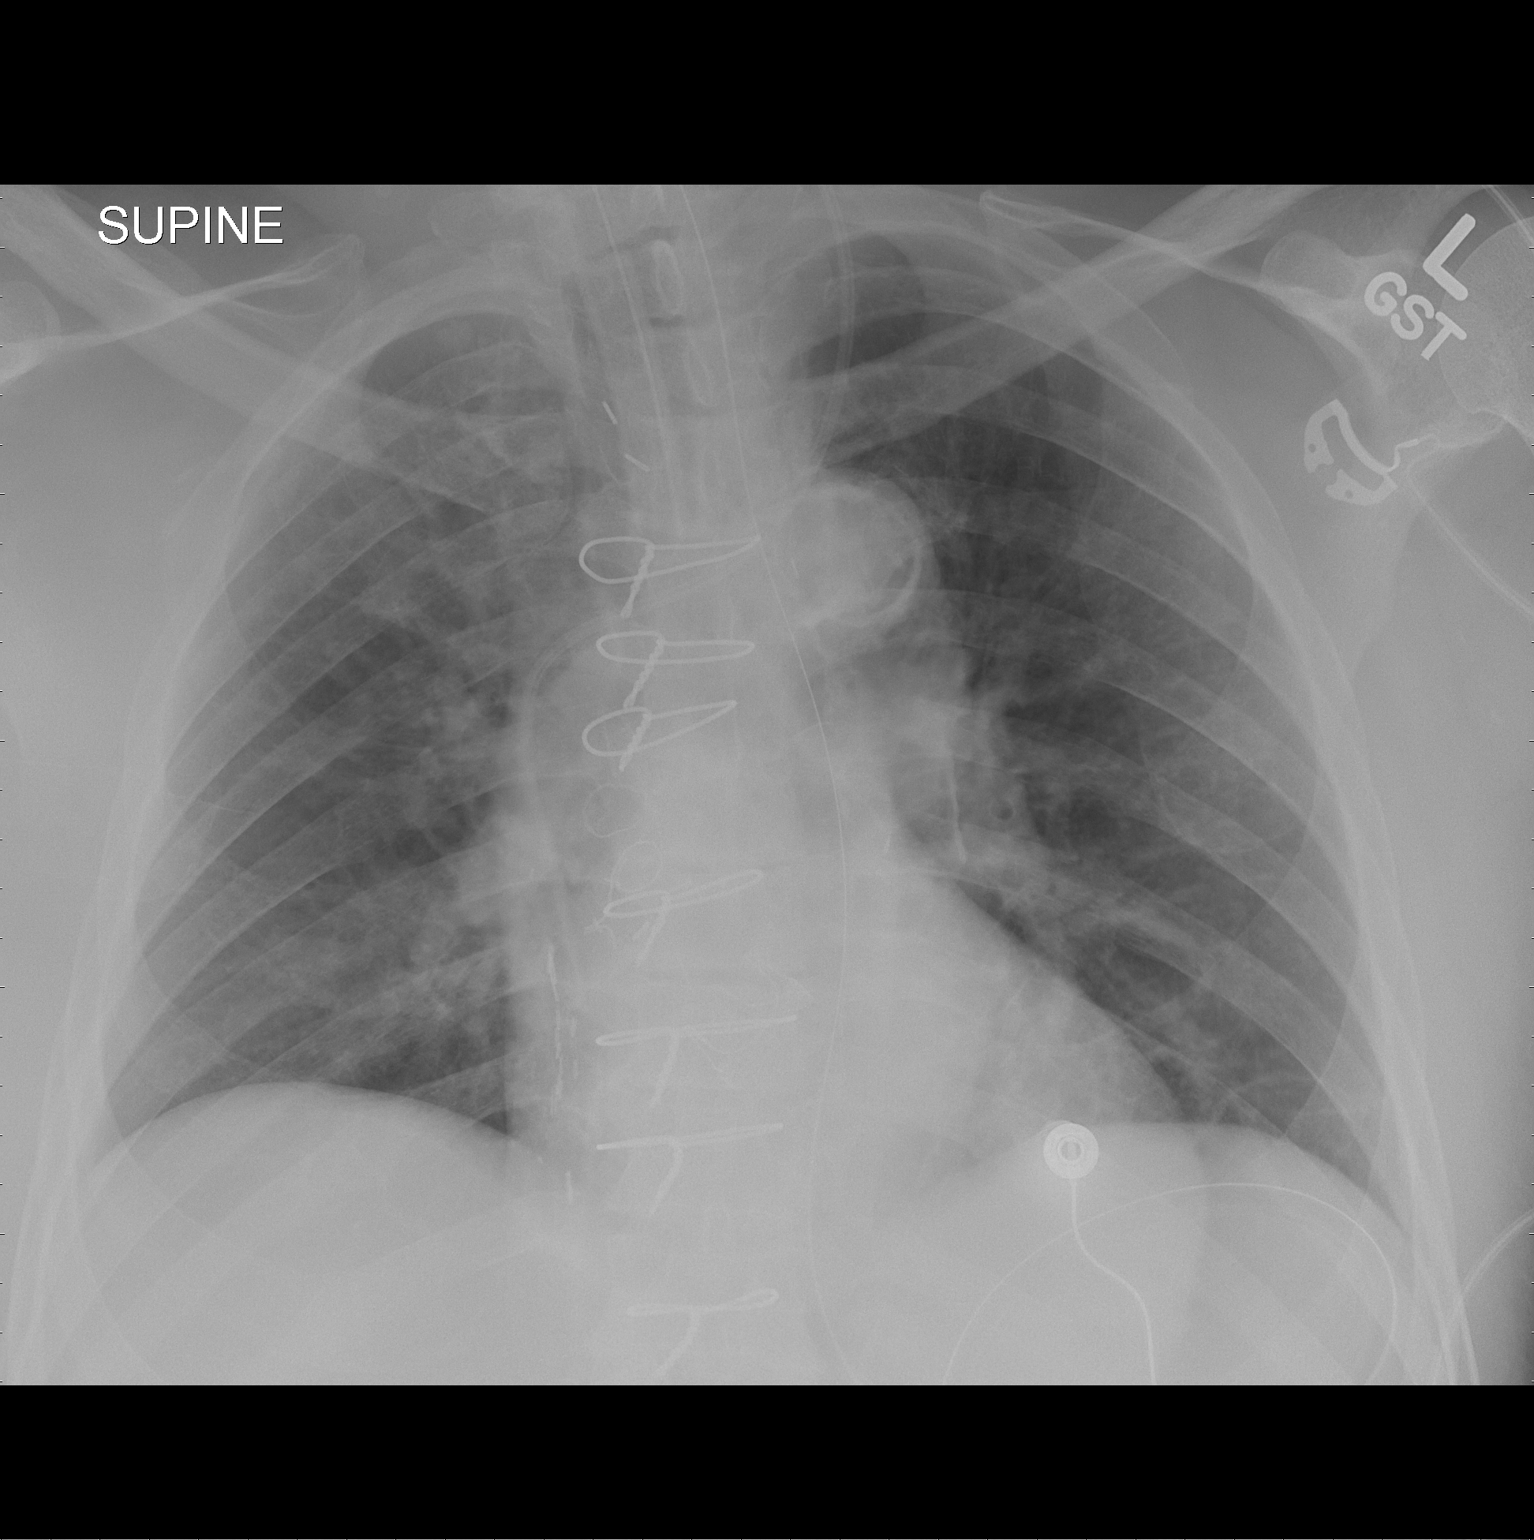
[im 2/2]
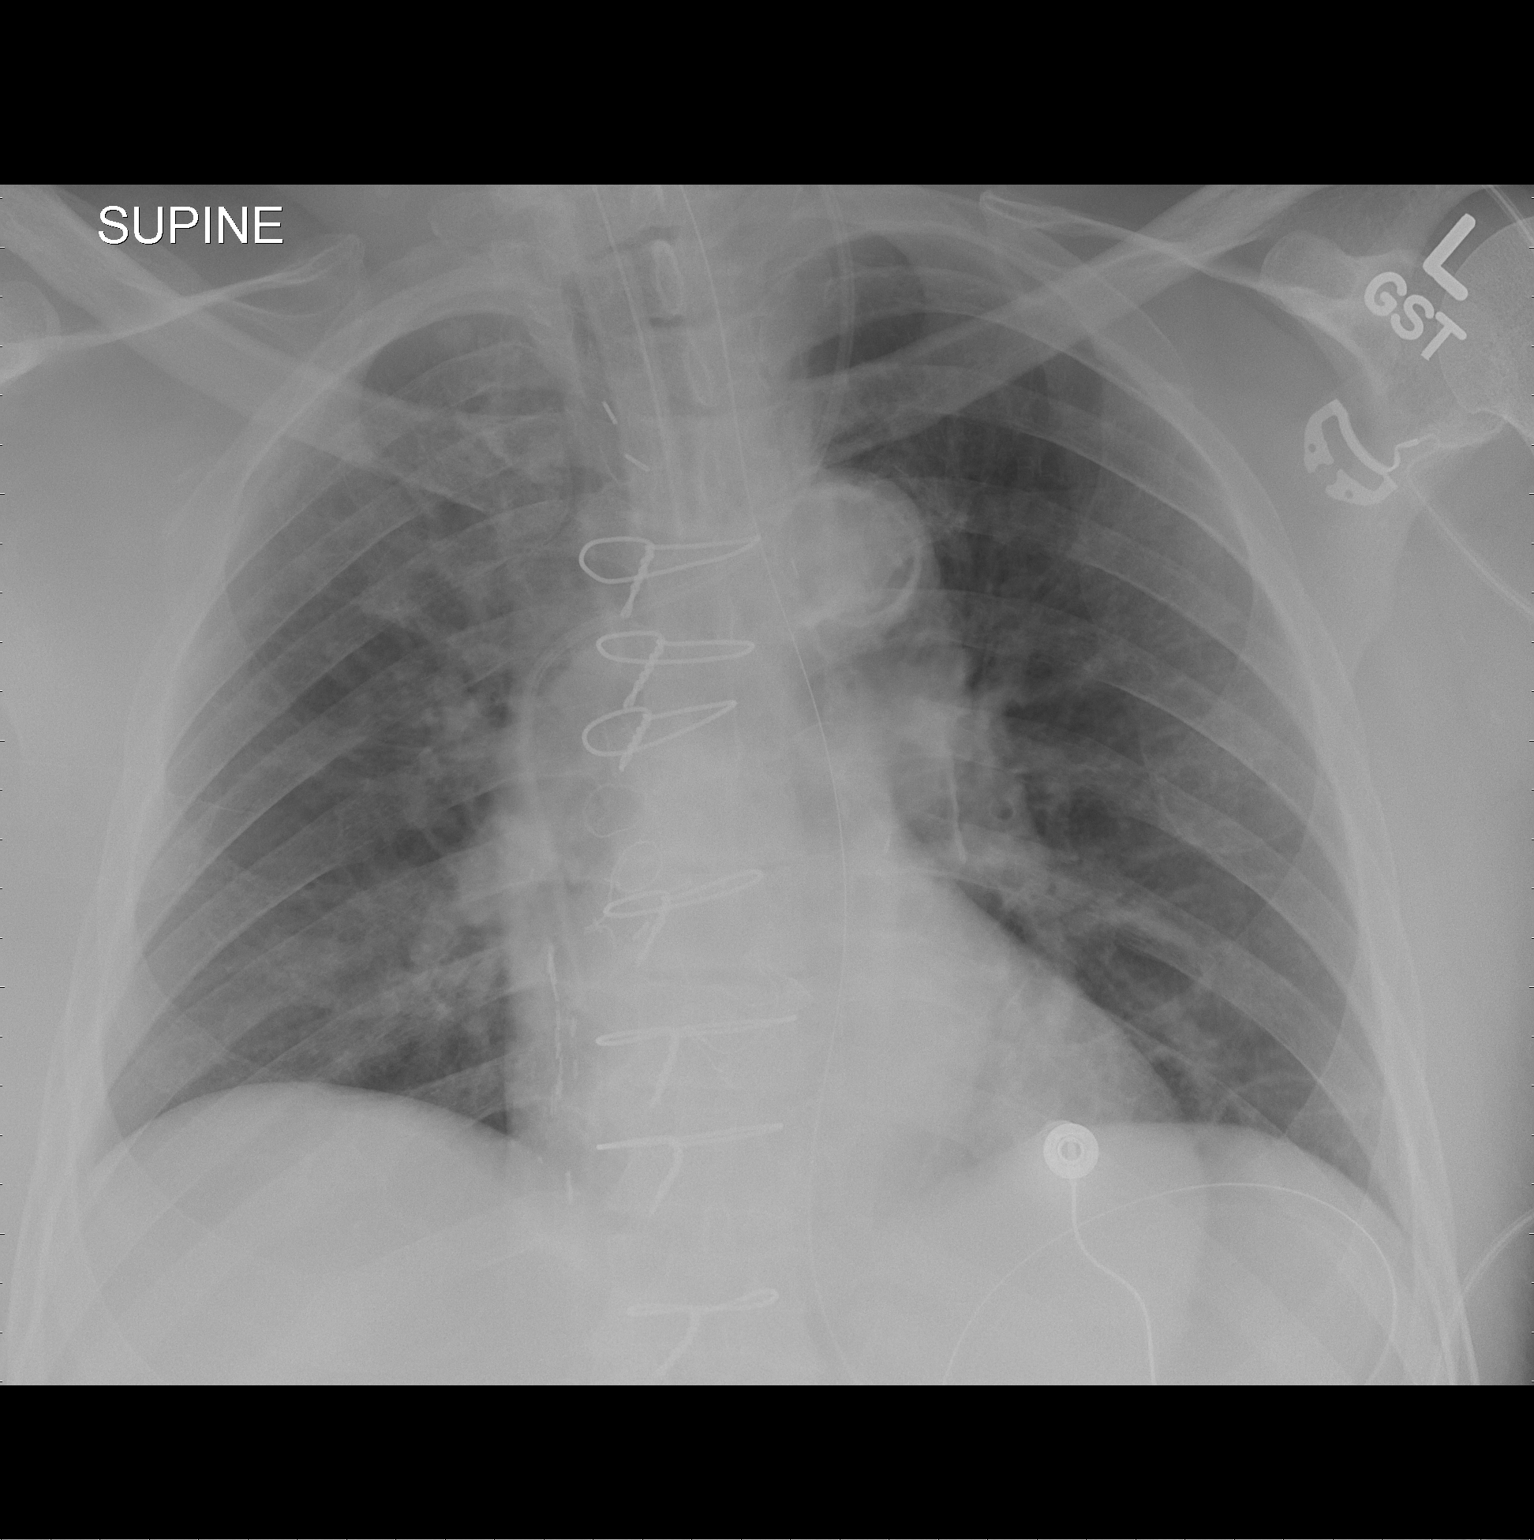

[2 of 2 positions shown; findings below may reference images not displayed]

FINDINGS: Endotracheal tube tip is positioned 3.7 cm proximal to
the carina.  Left approach central venous catheter tip projects
over the cavoatrial junction.  Status post median sternotomy and
CABG.  Aortic arch atherosclerotic calcification.  NG tube descends
into the abdomen coils within the gastric fundus.  Mild
interstitial opacity/bronchitic change.  No focal consolidation,
pleural effusion, pneumothorax.  There is a 7 mm nodule in the
right upper lobe that may be calcified.
IMPRESSION: Mild interstitial prominence / bronchitic change without focal
consolidation.

Endotracheal tube 3.7 cm proximal to the carina.

7 mm nodule right upper lobe. Attention on follow-up.

## 2014-02-27 IMAGING — CR DG CHEST 1V PORT
1 series · 1 of 1 positions shown · non-contrast
Comparison: May 06, 2011 at [DATE] a

CLINICAL DATA: ET tube placement; aortobifemoral graft

PORTABLE CHEST - 1 VIEW

[AP]
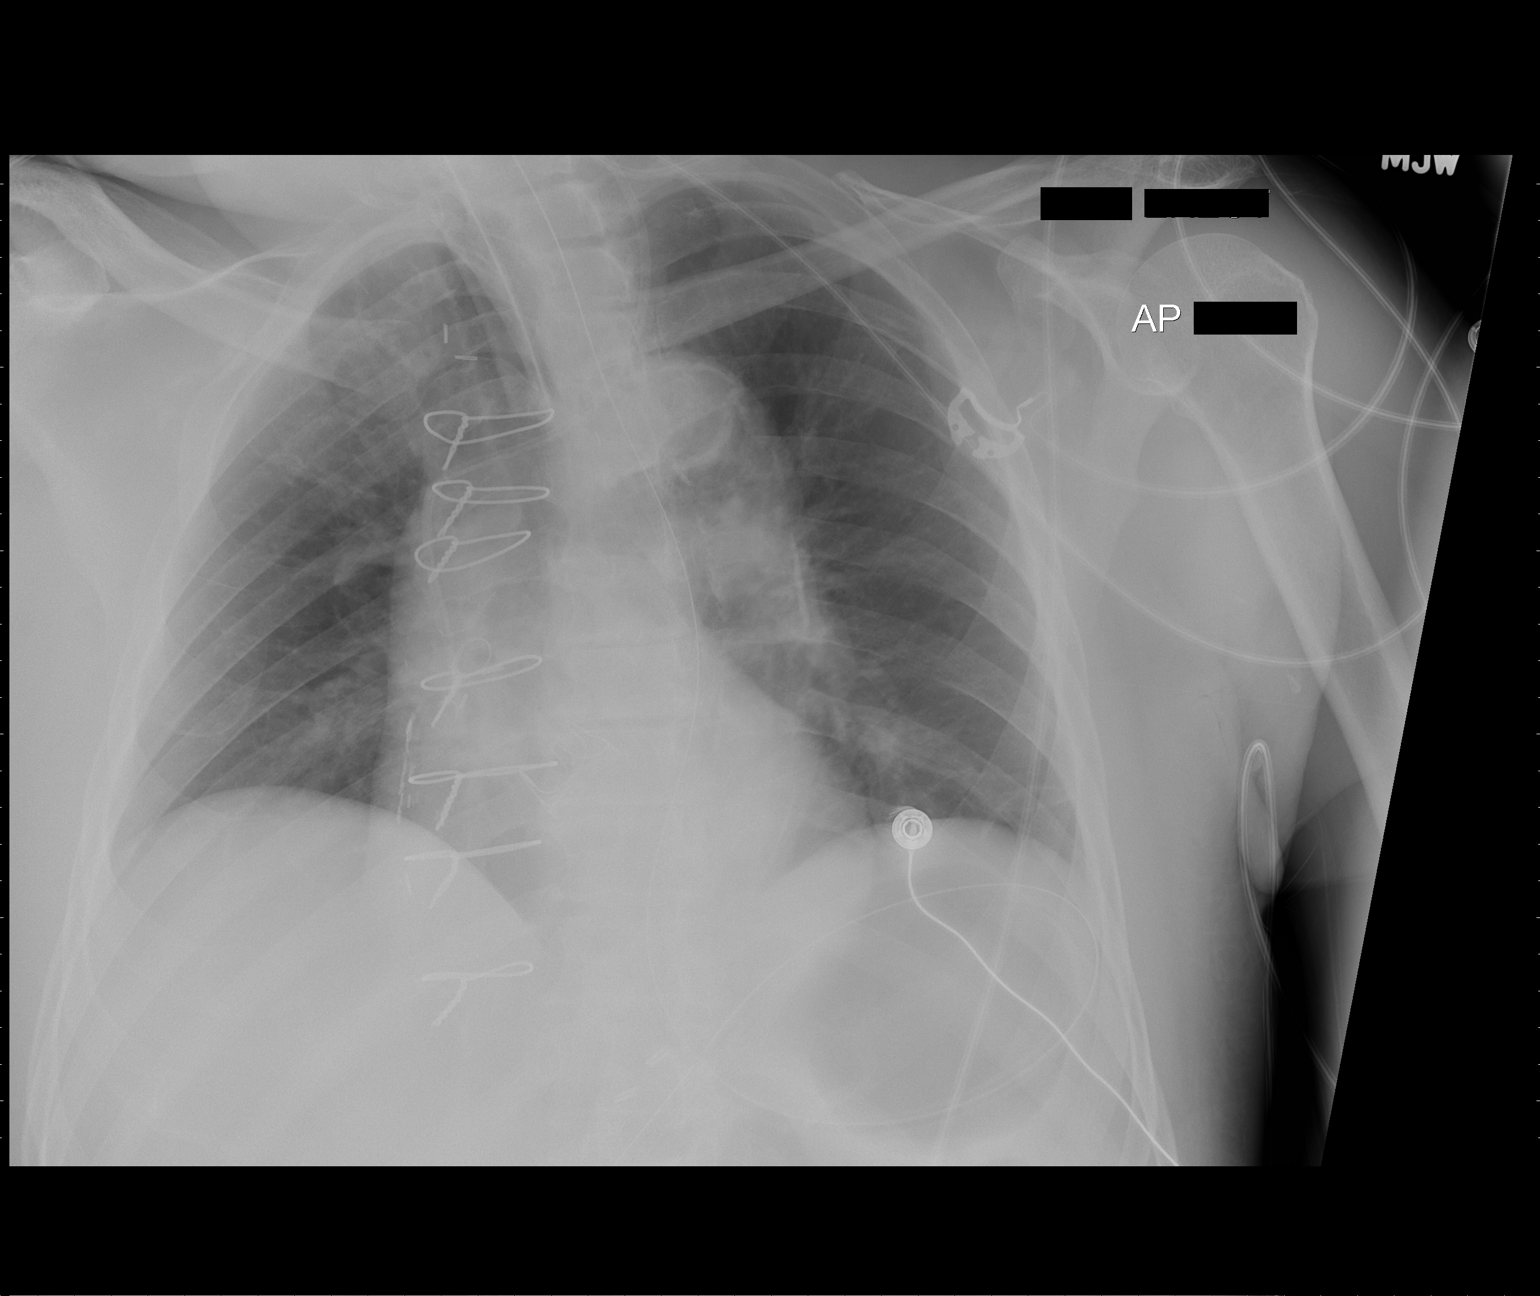

[1 of 1 positions shown; findings below may reference images not displayed]

FINDINGS: The ET tube remains well above the carina.  The
nasogastric tube tip is at the gastric antrum.  There is mild
pulmonary vascular cephalization without frank edema.  No focal
infiltrates or effusions are identified.  The left central line tip
is stable at the cavoatrial junction.
IMPRESSION: Stable chest x-ray with mild pulmonary vascular cephalization but
no frank edema.

## 2014-04-19 ENCOUNTER — Other Ambulatory Visit (HOSPITAL_COMMUNITY): Payer: Self-pay | Admitting: Internal Medicine

## 2014-04-19 ENCOUNTER — Ambulatory Visit (HOSPITAL_COMMUNITY)
Admission: RE | Admit: 2014-04-19 | Discharge: 2014-04-19 | Disposition: A | Discharge status: Home / Self Care | Payer: Medicare Other | Source: Ambulatory Visit | Attending: Internal Medicine | Admitting: Internal Medicine

## 2014-04-19 DIAGNOSIS — M25551 Pain in right hip: Secondary | ICD-10-CM | POA: Insufficient documentation

## 2014-04-19 DIAGNOSIS — M25552 Pain in left hip: Secondary | ICD-10-CM | POA: Insufficient documentation

## 2014-04-19 DIAGNOSIS — R52 Pain, unspecified: Secondary | ICD-10-CM

## 2014-04-20 ENCOUNTER — Other Ambulatory Visit: Payer: Self-pay | Admitting: Interventional Cardiology

## 2014-06-20 ENCOUNTER — Encounter: Payer: Self-pay | Admitting: Interventional Cardiology

## 2014-06-20 ENCOUNTER — Ambulatory Visit (INDEPENDENT_AMBULATORY_CARE_PROVIDER_SITE_OTHER): Payer: Medicare Other | Admitting: Interventional Cardiology

## 2014-06-20 VITALS — BP 154/78 | HR 50 | Ht 65.5 in | Wt 140.8 lb

## 2014-06-20 DIAGNOSIS — Z953 Presence of xenogenic heart valve: Secondary | ICD-10-CM | POA: Diagnosis not present

## 2014-06-20 DIAGNOSIS — I739 Peripheral vascular disease, unspecified: Secondary | ICD-10-CM | POA: Diagnosis not present

## 2014-06-20 DIAGNOSIS — I25810 Atherosclerosis of coronary artery bypass graft(s) without angina pectoris: Secondary | ICD-10-CM

## 2014-06-20 DIAGNOSIS — E785 Hyperlipidemia, unspecified: Secondary | ICD-10-CM

## 2014-06-20 DIAGNOSIS — Z954 Presence of other heart-valve replacement: Secondary | ICD-10-CM

## 2014-06-20 MED ORDER — METOPROLOL SUCCINATE ER 25 MG PO TB24: 25.0000 mg | ORAL_TABLET | Freq: Two times a day (BID) | ORAL | Status: DC

## 2014-06-20 MED ORDER — ATORVASTATIN CALCIUM 20 MG PO TABS: 20.0000 mg | ORAL_TABLET | Freq: Every day | ORAL | Status: DC

## 2014-06-20 NOTE — Patient Instructions (Signed)
Medication Instructions:  Your physician has recommended you make the following change in your medication: 1) INCREASE Atorvastatin to 20mg  daily 2) STOP Zetia 3) DECREASE Metoprolol to 25mg  twice daily  Labwork: Your physician recommends that you return for a FASTING lipid profile and alt in 6-8 weeks  Testing/Procedures: None   Follow-Up: Your physician wants you to follow-up in: 1 year with Dr.Smith You will receive a reminder letter in the mail two months in advance. If you don't receive a letter, please call our office to schedule the follow-up appointment.   Any Other Special Instructions Will Be Listed Below (If Applicable).

## 2014-06-20 NOTE — Progress Notes (Signed)
Cardiology Office Note   Date:  06/20/2014   ID:  Mark PackerLonnie E Bhatt Jr., DOB Jul 28, 1924, MRN [id number]  PCP:  Dorrene GermanAVBUERE,EDWIN A, MD  Cardiologist:   Lesleigh NoeSMITH III,Shaena Parkerson W, MD   Chief Complaint  Patient presents with  . Heart Murmur    AVR      History of Present Illness: Mark PackerLonnie E Lacount Jr. is a 81 y.o. male who presents for bioprosthetic aortic valve, coronary artery disease, peripheral vascular disease, hypertension, and diastolic heart failure.   He is doing well. Claudication in the right lower extremity is stable. He denies orthopnea and PND. No episodes of syncope or other acute cardiovascular complaints.    Past Medical History  Diagnosis Date  . Hyperlipidemia     takes Atorvastatin daily  . CAD (coronary artery disease)   . Gastritis   . BPH (benign prostatic hypertrophy)     Dr.Nesi is urologist  . Arthritis   . Claudication   . Hypertension     takes Ramipril and Metoprolol daily  . Heart murmur   . Asthma     as a young child  . Bruises easily     takes Pletal and ASA daily  . Colitis     hx of  . Hx of colonic polyps   . Urinary urgency     takes Flomax daily  . Diabetes mellitus     borderline diabetic  . Insomnia     takes Ambien nightly  . History of shingles 3-7461yrs ago  . Blind right eye     "since age 68-4; S/P whooping cough"  . Mental disorder     takes Aricept daily  . DEMENTIA   . Peripheral vascular disease   . Amputation of great toe, left, traumatic   . Palpitations     we need to exclude atrial fib and ventricular arrhythmia.    Past Surgical History  Procedure Laterality Date  . Colonoscopy    . Axillary-femoral bypass graft  05/05/2011    Procedure: BYPASS GRAFT AXILLA-BIFEMORAL;  Surgeon: Sherren Kernsharles E Fields, MD;  [facility];  Service: Vascular;  Laterality: Right;  . Femoral-popliteal bypass graft  05/05/2011    Procedure: BYPASS GRAFT FEMORAL-POPLITEAL ARTERY;  Surgeon: Sherren Kernsharles E Fields, MD;  [facility];  Service:  Vascular;  Laterality: Right;  . Tonsillectomy  1960  . Aortic valve replacement  06/2003    /E-chart  . Band hemorrhoidectomy  1960's  . Ostectomy  09/2001    right hip/E-chart  . Cardiac catheterization      multiple-see epic  . Coronary angioplasty      right leg  . Coronary angioplasty with stent placement  2000    /E-chart  . Coronary artery bypass graft  1989/06/2003    CABG X 5; CABG X1  . Cardiac valve replacement  06/2003  . Cataract extraction      left eye  . Axillary-femoral bypass graft  06/10/2011    Procedure: BYPASS GRAFT AXILLA-BIFEMORAL;  Surgeon: Sherren Kernsharles E Fields, MD;  [facility];  Service: Vascular;  Laterality: Right;  REVISION Right axillary bifemoral bypass using a 8mm x 80 cm Propaten Graft  . I&d extremity  07/04/2011    Procedure: IRRIGATION AND DEBRIDEMENT EXTREMITY;  Surgeon: Loanne DrillingFrank V Aluisio, MD;  Location: [facility] OR;  Service: Orthopedics;  Laterality: Left;  . Amputation  07/04/2011    Procedure: AMPUTATION FOOT;  Surgeon: Loanne DrillingFrank V Aluisio, MD;  Location: [facility] OR;  Service: Orthopedics;  Laterality:  Left;     Current Outpatient Prescriptions  Medication Sig Dispense Refill  . aspirin EC 325 MG tablet Take 325 mg by mouth at bedtime.     Marland Kitchen atorvastatin (LIPITOR) 20 MG tablet Take 1 tablet (20 mg total) by mouth daily. 30 tablet 5  . cilostazol (PLETAL) 100 MG tablet Take 100 mg by mouth 2 (two) times daily.    Marland Kitchen donepezil (ARICEPT) 10 MG tablet Take 10 mg by mouth at bedtime.     . fluvastatin XL (LESCOL XL) 80 MG 24 hr tablet Take 80 mg by mouth at bedtime.    . hydrOXYzine (ATARAX/VISTARIL) 10 MG tablet Take 1 tablet by mouth daily.    . meloxicam (MOBIC) 7.5 MG tablet Take 7.5 mg by mouth 2 (two) times daily as needed. for pain  2  . metoprolol succinate (TOPROL-XL) 25 MG 24 hr tablet Take 1 tablet (25 mg total) by mouth 2 (two) times daily.    Marland Kitchen MYRBETRIQ 25 MG TB24 tablet Take 25 mg by mouth daily.  5  . nitroGLYCERIN (NITROSTAT) 0.4 MG SL tablet Place  0.4 mg under the tongue every 5 (five) minutes as needed. For chest pain    . oxybutynin (DITROPAN-XL) 10 MG 24 hr tablet Take 10 mg by mouth daily.    Marland Kitchen PARoxetine (PAXIL) 10 MG tablet Take 1 tablet by mouth daily.    . ramipril (ALTACE) 5 MG tablet Take 5 mg by mouth daily.      . Tamsulosin HCl (FLOMAX) 0.4 MG CAPS Take 0.4 mg by mouth at bedtime.     . traMADol (ULTRAM) 50 MG tablet Take 1 tablet by mouth 2 (two) times daily.    . Vitamin D, Ergocalciferol, (DRISDOL) 50000 UNITS CAPS capsule Take 50,000 Units by mouth once a week. TUESDAY  5  . VOLTAREN 1 % GEL 4 (four) times daily.    Marland Kitchen zolpidem (AMBIEN) 10 MG tablet Take 10 mg by mouth at bedtime as needed. For sleep     No current facility-administered medications for this visit.    Allergies:   Shrimp    Social History:  The patient  reports that he quit smoking about 74 years ago. His smoking use included Cigarettes. He has never used smokeless tobacco. He reports that he does not drink alcohol or use illicit drugs.   Family History:  The patient's family history includes Cancer in his mother; Heart disease in his father. There is no history of Anesthesia problems, Hypotension, Malignant hyperthermia, or Pseudochol deficiency.    ROS:  Please see the history of present illness.   Otherwise, review of systems are positive for inability to maintain an erection. Request PDE 5 inhibitor therapy..   All other systems are reviewed and negative.    PHYSICAL EXAM: VS:  BP 154/78 mmHg  Pulse 50  Ht 5' 5.5" (1.664 m)  Wt 140 lb 12.8 oz (63.866 kg)  BMI 23.07 kg/m2 , BMI Body mass index is 23.07 kg/(m^2). GEN: Well nourished, well developed, in no acute distress HEENT: normal Neck: no JVD, carotid bruits, or masses Cardiac: RRR; no murmurs, rubs, or gallops,no edema. There is a systolic murmur in the right upper sternal border. No diastolic murmurs heard.  Respiratory:  clear to auscultation bilaterally, normal work of breathing GI:  soft, nontender, nondistended, + BS MS: no deformity or atrophy Skin: warm and dry, no rash Neuro:  Strength and sensation are intact Psych: euthymic mood, full affect   EKG:  EKG is  ordered today. The ekg ordered today demonstrates sinus bradycardia, right bundle branch block, inferior infarct.   Recent Labs: 08/03/2013: ALT 14    Lipid Panel    Component Value Date/Time   CHOL 120 08/03/2013 0833   TRIG 67.0 08/03/2013 0833   HDL 46.40 08/03/2013 0833   CHOLHDL 3 08/03/2013 0833   VLDL 13.4 08/03/2013 0833   LDLCALC 60 08/03/2013 0833      Wt Readings from Last 3 Encounters:  06/20/14 140 lb 12.8 oz (63.866 kg)  09/19/13 133 lb (60.328 kg)  05/18/13 134 lb (60.782 kg)      Other studies Reviewed: Additional studies/ records that were reviewed today include: .   ASSESSMENT AND PLAN:  S/P aortic valve replacement with bioprosthetic valve - systolic murmur. No aortic regurgitation is heard.  Coronary artery disease involving coronary bypass graft of native heart without angina pectoris - Plan: EKG 12-Lead  PVD (peripheral vascular disease) with claudication  Hyperlipidemia - Plan: Lipid panel, ALT  Erectile dysfunction: The patient's request for PDE 5 inhibitor has been refused. I feel that this could potentially dangerous in his age category.      Current medicines are reviewed at length with the patient today.  The patient does not have concerns regarding medicines.  The following changes have been made:  He requests a PDE 5 inhibitor. After consideration and at his wife's input, we have decided against this. Because that he is so expensive, we will double the dose of atorvastatin, repeat a lipid panel with liver in 6 weeks. We will discontinue Zetia.  Labs/ tests ordered today include:   Orders Placed This Encounter  Procedures  . Lipid panel  . ALT  . EKG 12-Lead     Disposition:   FU with HS in 1 yr. Signed, Lesleigh Noe, MD  06/20/2014  12:39 PM    [facility] [address] [place], [place], [place]  [address] Phone: [phone]; Fax: [phone]

## 2014-07-10 ENCOUNTER — Encounter: Payer: Self-pay | Admitting: Vascular Surgery

## 2014-07-10 ENCOUNTER — Other Ambulatory Visit: Payer: Self-pay | Admitting: *Deleted

## 2014-07-10 DIAGNOSIS — I739 Peripheral vascular disease, unspecified: Secondary | ICD-10-CM

## 2014-07-25 ENCOUNTER — Encounter: Payer: Self-pay | Admitting: Vascular Surgery

## 2014-07-26 ENCOUNTER — Other Ambulatory Visit: Payer: Self-pay | Admitting: Vascular Surgery

## 2014-07-26 DIAGNOSIS — Z48812 Encounter for surgical aftercare following surgery on the circulatory system: Secondary | ICD-10-CM

## 2014-07-26 DIAGNOSIS — I739 Peripheral vascular disease, unspecified: Secondary | ICD-10-CM

## 2014-07-27 ENCOUNTER — Ambulatory Visit (INDEPENDENT_AMBULATORY_CARE_PROVIDER_SITE_OTHER): Payer: Medicare Other | Admitting: Vascular Surgery

## 2014-07-27 ENCOUNTER — Ambulatory Visit (INDEPENDENT_AMBULATORY_CARE_PROVIDER_SITE_OTHER)
Admission: RE | Admit: 2014-07-27 | Discharge: 2014-07-27 | Disposition: A | Discharge status: Home / Self Care | Payer: Medicare Other | Source: Ambulatory Visit | Attending: Vascular Surgery | Admitting: Vascular Surgery

## 2014-07-27 ENCOUNTER — Ambulatory Visit (HOSPITAL_COMMUNITY)
Admission: RE | Admit: 2014-07-27 | Discharge: 2014-07-27 | Disposition: A | Discharge status: Home / Self Care | Payer: Medicare Other | Source: Ambulatory Visit | Attending: Vascular Surgery | Admitting: Vascular Surgery

## 2014-07-27 ENCOUNTER — Encounter: Payer: Self-pay | Admitting: Vascular Surgery

## 2014-07-27 VITALS — BP 146/65 | HR 62 | Resp 14 | Ht 65.0 in | Wt 132.0 lb

## 2014-07-27 DIAGNOSIS — I25810 Atherosclerosis of coronary artery bypass graft(s) without angina pectoris: Secondary | ICD-10-CM | POA: Diagnosis not present

## 2014-07-27 DIAGNOSIS — Z48812 Encounter for surgical aftercare following surgery on the circulatory system: Secondary | ICD-10-CM

## 2014-07-27 DIAGNOSIS — I739 Peripheral vascular disease, unspecified: Secondary | ICD-10-CM

## 2014-07-27 NOTE — Progress Notes (Signed)
[facility] [place] HISTORY AND PHYSICAL   History of Present Illness:  Patient is a 81 y.o. year old male who presents for evaluation of lower extremity pain. The patient previously had us undergone a right axillary bifemoral bypass as well as a right femoral to below-knee popliteal bypass. The axbifem was a Dacron graft. The femoropopliteal was a PTFE graft. This was in March 2013. Recently he has had more difficulty with pain in his thighs and calf after walking 1 block. He states the left leg is worse than the right. This is been going on for about 6 months. He denies rest pain. He has no evidence of nonhealing wounds. He has developed some progressive dementia. He has mainly sundowning type symptoms. He is able to complete his daily activities as well as work in his garden. He has significant short-term memory loss. Other medical problems include hyperlipidemia, coronary artery disease, benign prostatic hypertrophy, hypertension all of which are currently stable.  Past Medical History  Diagnosis Date  . Hyperlipidemia     takes Atorvastatin daily  . CAD (coronary artery disease)   . Gastritis   . BPH (benign prostatic hypertrophy)     Dr.Nesi is urologist  . Arthritis   . Claudication   . Hypertension     takes Ramipril and Metoprolol daily  . Heart murmur   . Asthma     as a young child  . Bruises easily     takes Pletal and ASA daily  . Colitis     hx of  . Hx of colonic polyps   . Urinary urgency     takes Flomax daily  . Diabetes mellitus     borderline diabetic  . Insomnia     takes Ambien nightly  . History of shingles 3-4yrs ago  . Blind right eye     "since age 3-4; S/P whooping cough"  . Mental disorder     takes Aricept daily  . DEMENTIA   . Peripheral vascular disease   . Amputation of great toe, left, traumatic   . Palpitations     we need to exclude atrial fib and ventricular arrhythmia.    Past Surgical History  Procedure Laterality  Date  . Colonoscopy    . Axillary-femoral bypass graft  05/05/2011    Procedure: BYPASS GRAFT AXILLA-BIFEMORAL;  Surgeon: Hooria Gasparini E Haelie Clapp, MD;  Location: [facility] OR;  Service: Vascular;  Laterality: Right;  . Femoral-popliteal bypass graft  05/05/2011    Procedure: BYPASS GRAFT FEMORAL-POPLITEAL ARTERY;  Surgeon: Teleah Villamar E Lesli Issa, MD;  Location: [facility] OR;  Service: Vascular;  Laterality: Right;  . Tonsillectomy  1960  . Aortic valve replacement  06/2003    /E-chart  . Band hemorrhoidectomy  1960's  . Ostectomy  09/2001    right hip/E-chart  . Cardiac catheterization      multiple-see epic  . Coronary angioplasty      right leg  . Coronary angioplasty with stent placement  2000    /E-chart  . Coronary artery bypass graft  1989/06/2003    CABG X 5; CABG X1  . Cardiac valve replacement  06/2003  . Cataract extraction      left eye  . Axillary-femoral bypass graft  06/10/2011    Procedure: BYPASS GRAFT AXILLA-BIFEMORAL;  Surgeon: Saydee Zolman E Bentlee Drier, MD;  Location: [facility] OR;  Service: Vascular;  Laterality: Right;  REVISION Right axillary bifemoral bypass using a 8mm x 80 cm Propaten Graft  . I&d extremity    07/04/2011    Procedure: IRRIGATION AND DEBRIDEMENT EXTREMITY;  Surgeon: Frank V Aluisio, MD;  Location: [facility] OR;  Service: Orthopedics;  Laterality: Left;  . Amputation  07/04/2011    Procedure: AMPUTATION FOOT;  Surgeon: Frank V Aluisio, MD;  Location: [facility] OR;  Service: Orthopedics;  Laterality: Left;    Social History History  Substance Use Topics  . Smoking status: Former Smoker    Types: Cigarettes    Quit date: 02/25/1940  . Smokeless tobacco: Never Used  . Alcohol Use: No     Comment: "last alcohol was ~ 1950's"    Family History Family History  Problem Relation Age of Onset  . Heart disease Father   . Anesthesia problems Neg Hx   . Hypotension Neg Hx   . Malignant hyperthermia Neg Hx   . Pseudochol deficiency Neg Hx   . Cancer Mother     Allergies  Allergies  Allergen Reactions   . Shrimp [Shellfish Allergy] Nausea And Vomiting    "haven't eaten any since 1980's" pt only allergic to shrimp. Not shellfish     Current Outpatient Prescriptions  Medication Sig Dispense Refill  . aspirin EC 325 MG tablet Take 325 mg by mouth at bedtime.     . atorvastatin (LIPITOR) 20 MG tablet Take 1 tablet (20 mg total) by mouth daily. 30 tablet 5  . donepezil (ARICEPT) 10 MG tablet Take 10 mg by mouth at bedtime.     . esomeprazole (NEXIUM) 40 MG capsule Take 40 mg by mouth daily at 12 noon.    . ezetimibe (ZETIA) 10 MG tablet Take 10 mg by mouth daily.    . hydrOXYzine (ATARAX/VISTARIL) 10 MG tablet Take 1 tablet by mouth daily.    . meloxicam (MOBIC) 7.5 MG tablet Take 7.5 mg by mouth 2 (two) times daily as needed. for pain  2  . metoprolol succinate (TOPROL-XL) 25 MG 24 hr tablet Take 1 tablet (25 mg total) by mouth 2 (two) times daily.    . MYRBETRIQ 25 MG TB24 tablet Take 25 mg by mouth daily.  5  . nitroGLYCERIN (NITROSTAT) 0.4 MG SL tablet Place 0.4 mg under the tongue every 5 (five) minutes as needed. For chest pain    . oxybutynin (DITROPAN-XL) 10 MG 24 hr tablet Take 10 mg by mouth daily.    . ramipril (ALTACE) 5 MG tablet Take 5 mg by mouth daily.      . Tamsulosin HCl (FLOMAX) 0.4 MG CAPS Take 0.4 mg by mouth at bedtime.     . traMADol (ULTRAM) 50 MG tablet Take 1 tablet by mouth 2 (two) times daily.    . Vitamin D, Ergocalciferol, (DRISDOL) 50000 UNITS CAPS capsule Take 50,000 Units by mouth once a week. TUESDAY  5  . zolpidem (AMBIEN) 10 MG tablet Take 10 mg by mouth at bedtime as needed. For sleep    . cilostazol (PLETAL) 100 MG tablet Take 100 mg by mouth 2 (two) times daily.    . fluvastatin XL (LESCOL XL) 80 MG 24 hr tablet Take 80 mg by mouth at bedtime.    . PARoxetine (PAXIL) 10 MG tablet Take 1 tablet by mouth daily.    . VOLTAREN 1 % GEL 4 (four) times daily.     No current facility-administered medications for this visit.    ROS:   General:  No weight  loss, Fever, chills  HEENT: No recent headaches, no nasal bleeding, no visual changes, no sore throat  Neurologic: No dizziness,   blackouts, seizures. No recent symptoms of stroke or mini- stroke. No recent episodes of slurred speech, or temporary blindness.  Cardiac: No recent episodes of chest pain/pressure, no shortness of breath at rest.  No shortness of breath with exertion.  Denies history of atrial fibrillation or irregular heartbeat  Vascular: No history of rest pain in feet.  + history of claudication.  No history of non-healing ulcer, No history of DVT   Pulmonary: No home oxygen, no productive cough, no hemoptysis,  No asthma or wheezing  Musculoskeletal:  [ ] Arthritis, [ ] Low back pain,  [ ] Joint pain  Hematologic:No history of hypercoagulable state.  No history of easy bleeding.  No history of anemia  Gastrointestinal: No hematochezia or melena,  No gastroesophageal reflux, no trouble swallowing  Urinary: [ ] chronic Kidney disease, [ ] on HD - [ ] MWF or [ ] TTHS, [ ] Burning with urination, [ ] Frequent urination, [ ] Difficulty urinating;   Skin: No rashes  Psychological: No history of anxiety,  No history of depression   Physical Examination  Filed Vitals:   07/27/14 1522  BP: 146/65  Pulse: 62  Resp: 14  Height: 5' 5" (1.651 m)  Weight: 59.875 kg (132 lb)    Body mass index is 21.97 kg/(m^2).  General:  Alert and oriented, no acute distress HEENT: Normal Neck: No bruit or JVD, he does have a right infraclavicular bruit. Pulmonary: Clear to auscultation bilaterally Cardiac: Regular Rate and Rhythm without murmur Abdomen: Soft, non-tender, non-distended, no mass, palpable graft pulse in the axillary bypass along right chest wall  Skin: No rash Extremity Pulses:  Absent radial, 1-2+ brachial, 2+ femoral, 2+ right dorsalis pedis, absent dorsalis pedis, posterior tibial pulses left leg  Musculoskeletal: No deformity or edema  Neurologic: Upper and lower  extremity motor 5/5 and symmetric  DATA:  Patient had a graft duplex exam today which showed some recurrent narrowing of the left common femoral artery he has also developed narrowing in the right proximal subclavian artery proximal to the bypass graft the axbifem and the femoropopliteal were widely patent otherwise   ASSESSMENT:  Possible inflow stenosis which is feeding the lower extremity circulation. Also possible recurrent inflow/outflow stenosis left common femoral artery.   PLAN:  Aortogram with bilateral lower extremity runoff possible intervention. My approach will be an attempt from a right brachial puncture in order to do retrograde stenting of the right subclavian artery if necessary. If the patient does not have a solution from a percutaneous option we will need to have further discussions whether or not to consider an open operation in light of his age and chronic dementia. Risks benefits possible complications and procedure details of aortogram possible intervention were discussed with the patient and his wife today. These include but are not limited to bleeding infection contrast reaction vessel injury.  Odesser Tourangeau, MD [facility] [place] Office: [phone] Pager: [phone]   

## 2014-07-28 ENCOUNTER — Other Ambulatory Visit: Payer: Self-pay

## 2014-08-01 ENCOUNTER — Other Ambulatory Visit: Payer: Self-pay

## 2014-08-04 ENCOUNTER — Encounter (HOSPITAL_COMMUNITY): Payer: Self-pay | Admitting: Vascular Surgery

## 2014-08-04 ENCOUNTER — Ambulatory Visit (HOSPITAL_COMMUNITY)
Admission: RE | Admit: 2014-08-04 | Discharge: 2014-08-04 | Disposition: A | Discharge status: Home / Self Care | Payer: Medicare Other | Source: Ambulatory Visit | Attending: Vascular Surgery | Admitting: Vascular Surgery

## 2014-08-04 ENCOUNTER — Other Ambulatory Visit: Payer: Self-pay | Admitting: *Deleted

## 2014-08-04 ENCOUNTER — Encounter (HOSPITAL_COMMUNITY)
Admission: RE | Disposition: A | Discharge status: Home / Self Care | Payer: Medicare Other | Source: Ambulatory Visit | Attending: Vascular Surgery

## 2014-08-04 DIAGNOSIS — I6522 Occlusion and stenosis of left carotid artery: Secondary | ICD-10-CM | POA: Diagnosis not present

## 2014-08-04 DIAGNOSIS — Z7982 Long term (current) use of aspirin: Secondary | ICD-10-CM | POA: Diagnosis not present

## 2014-08-04 DIAGNOSIS — E785 Hyperlipidemia, unspecified: Secondary | ICD-10-CM | POA: Insufficient documentation

## 2014-08-04 DIAGNOSIS — R011 Cardiac murmur, unspecified: Secondary | ICD-10-CM | POA: Diagnosis not present

## 2014-08-04 DIAGNOSIS — J45909 Unspecified asthma, uncomplicated: Secondary | ICD-10-CM | POA: Insufficient documentation

## 2014-08-04 DIAGNOSIS — F0390 Unspecified dementia without behavioral disturbance: Secondary | ICD-10-CM | POA: Insufficient documentation

## 2014-08-04 DIAGNOSIS — H5441 Blindness, right eye, normal vision left eye: Secondary | ICD-10-CM | POA: Diagnosis not present

## 2014-08-04 DIAGNOSIS — I2510 Atherosclerotic heart disease of native coronary artery without angina pectoris: Secondary | ICD-10-CM | POA: Diagnosis not present

## 2014-08-04 DIAGNOSIS — Z91013 Allergy to seafood: Secondary | ICD-10-CM | POA: Diagnosis not present

## 2014-08-04 DIAGNOSIS — I10 Essential (primary) hypertension: Secondary | ICD-10-CM | POA: Diagnosis not present

## 2014-08-04 DIAGNOSIS — Z89412 Acquired absence of left great toe: Secondary | ICD-10-CM | POA: Insufficient documentation

## 2014-08-04 DIAGNOSIS — Z87891 Personal history of nicotine dependence: Secondary | ICD-10-CM | POA: Diagnosis not present

## 2014-08-04 DIAGNOSIS — I739 Peripheral vascular disease, unspecified: Secondary | ICD-10-CM

## 2014-08-04 DIAGNOSIS — I708 Atherosclerosis of other arteries: Secondary | ICD-10-CM | POA: Diagnosis present

## 2014-08-04 DIAGNOSIS — G4700 Insomnia, unspecified: Secondary | ICD-10-CM | POA: Insufficient documentation

## 2014-08-04 DIAGNOSIS — R7309 Other abnormal glucose: Secondary | ICD-10-CM | POA: Insufficient documentation

## 2014-08-04 DIAGNOSIS — Z48812 Encounter for surgical aftercare following surgery on the circulatory system: Secondary | ICD-10-CM

## 2014-08-04 DIAGNOSIS — N401 Enlarged prostate with lower urinary tract symptoms: Secondary | ICD-10-CM | POA: Diagnosis not present

## 2014-08-04 DIAGNOSIS — R3915 Urgency of urination: Secondary | ICD-10-CM | POA: Diagnosis not present

## 2014-08-04 DIAGNOSIS — I70208 Unspecified atherosclerosis of native arteries of extremities, other extremity: Secondary | ICD-10-CM

## 2014-08-04 DIAGNOSIS — Z9862 Peripheral vascular angioplasty status: Secondary | ICD-10-CM

## 2014-08-04 HISTORY — PX: PERIPHERAL VASCULAR CATHETERIZATION: SHX172C

## 2014-08-04 LAB — POCT ACTIVATED CLOTTING TIME
Activated Clotting Time: 202 seconds
Activated Clotting Time: 183 seconds
Activated Clotting Time: 171 seconds

## 2014-08-04 LAB — POCT I-STAT, CHEM 8
BUN: 22 mg/dL — ABNORMAL HIGH (ref 6–20)
CREATININE: 1.6 mg/dL — AB (ref 0.61–1.24)
Calcium, Ion: 1.13 mmol/L (ref 1.13–1.30)
Chloride: 104 mmol/L (ref 101–111)
GLUCOSE: 120 mg/dL — AB (ref 65–99)
HCT: 41 % (ref 39.0–52.0)
Hemoglobin: 13.9 g/dL (ref 13.0–17.0)
POTASSIUM: 4.2 mmol/L (ref 3.5–5.1)
Sodium: 141 mmol/L (ref 135–145)
TCO2: 24 mmol/L (ref 0–100)

## 2014-08-04 LAB — GLUCOSE, CAPILLARY: Glucose-Capillary: 110 mg/dL — ABNORMAL HIGH (ref 65–99)

## 2014-08-04 SURGERY — AORTIC ARCH ANGIOGRAPHY
Laterality: Right

## 2014-08-04 MED ORDER — HEPARIN SODIUM (PORCINE) 1000 UNIT/ML IJ SOLN
INTRAMUSCULAR | Status: DC | PRN
  Administered 2014-08-04: 2000 [IU] via INTRAVENOUS
  Administered 2014-08-04: 5000 [IU] via INTRAVENOUS

## 2014-08-04 MED ORDER — LIDOCAINE HCL (PF) 1 % IJ SOLN
INTRAMUSCULAR | Status: AC
  Filled 2014-08-04: qty 30

## 2014-08-04 MED ORDER — HEPARIN (PORCINE) IN NACL 2-0.9 UNIT/ML-% IJ SOLN
INTRAMUSCULAR | Status: AC
  Filled 2014-08-04: qty 1000

## 2014-08-04 MED ORDER — HYDRALAZINE HCL 20 MG/ML IJ SOLN
INTRAMUSCULAR | Status: AC
  Filled 2014-08-04: qty 1

## 2014-08-04 MED ORDER — METOPROLOL TARTRATE 1 MG/ML IV SOLN: 2.0000 mg | INTRAVENOUS | Status: DC | PRN

## 2014-08-04 MED ORDER — IODIXANOL 320 MG/ML IV SOLN
INTRAVENOUS | Status: DC | PRN
  Administered 2014-08-04: 125 mL via INTRAVENOUS

## 2014-08-04 MED ORDER — HEPARIN SODIUM (PORCINE) 1000 UNIT/ML IJ SOLN
INTRAMUSCULAR | Status: AC
  Filled 2014-08-04: qty 1

## 2014-08-04 MED ORDER — SODIUM CHLORIDE 0.9 % IV SOLN
INTRAVENOUS | Status: DC
  Administered 2014-08-04: 07:00:00 via INTRAVENOUS

## 2014-08-04 MED ORDER — MORPHINE SULFATE 10 MG/ML IJ SOLN: 2.0000 mg | INTRAMUSCULAR | Status: DC | PRN

## 2014-08-04 MED ORDER — ACETAMINOPHEN 325 MG RE SUPP
325.0000 mg | RECTAL | Status: DC | PRN
  Filled 2014-08-04: qty 2

## 2014-08-04 MED ORDER — ONDANSETRON HCL 4 MG/2ML IJ SOLN: 4.0000 mg | Freq: Four times a day (QID) | INTRAMUSCULAR | Status: DC | PRN

## 2014-08-04 MED ORDER — LIDOCAINE HCL (PF) 1 % IJ SOLN
INTRAMUSCULAR | Status: DC | PRN
  Administered 2014-08-04: 30 mL

## 2014-08-04 MED ORDER — ACETAMINOPHEN 325 MG PO TABS
325.0000 mg | ORAL_TABLET | ORAL | Status: DC | PRN
Start: 2014-08-04 — End: 2014-08-04
  Filled 2014-08-04: qty 2

## 2014-08-04 MED ORDER — NITROGLYCERIN 1 MG/10 ML FOR IR/CATH LAB
INTRA_ARTERIAL | Status: AC
  Filled 2014-08-04: qty 10

## 2014-08-04 MED ORDER — NITROGLYCERIN 1 MG/10 ML FOR IR/CATH LAB
INTRA_ARTERIAL | Status: DC | PRN
  Administered 2014-08-04: 200 ug via INTRA_ARTERIAL

## 2014-08-04 MED ORDER — HYDRALAZINE HCL 20 MG/ML IJ SOLN
5.0000 mg | INTRAMUSCULAR | Status: DC | PRN
  Administered 2014-08-04: 5 mg via INTRAVENOUS

## 2014-08-04 MED ORDER — LABETALOL HCL 5 MG/ML IV SOLN: 10.0000 mg | INTRAVENOUS | Status: DC | PRN

## 2014-08-04 MED ORDER — SODIUM CHLORIDE 0.45 % IV SOLN
INTRAVENOUS | Status: DC
  Administered 2014-08-04: 10:00:00 via INTRAVENOUS

## 2014-08-04 SURGICAL SUPPLY — 26 items
BALLN MUSTANG 8X20X75 (BALLOONS) ×3
BALLOON MUSTANG 8X20X75 (BALLOONS) IMPLANT
CATH ANGIO 5F BER2 65CM (CATHETERS) ×1 IMPLANT
CATH ANGIO 5F PIGTAIL 65CM (CATHETERS) ×1 IMPLANT
CATH STRAIGHT 5FR 65CM (CATHETERS) ×1 IMPLANT
COVER PRB 48X5XTLSCP FOLD TPE (BAG) IMPLANT
COVER PROBE 5X48 (BAG) ×3
DEVICE CONTINUOUS FLUSH (MISCELLANEOUS) ×1 IMPLANT
HAND CONTROLLER AVANTA (MISCELLANEOUS) IMPLANT
KIT ENCORE 26 ADVANTAGE (KITS) ×1 IMPLANT
KIT MICROINTRODUCER STIFF 5F (SHEATH) ×1 IMPLANT
KIT PV (KITS) ×3 IMPLANT
SET AVANTA MULTI PATIENT (MISCELLANEOUS) IMPLANT
SET AVANTA SINGLE PATIENT (MISCELLANEOUS) IMPLANT
SHEATH AVANTA HAND CONTROLLER (MISCELLANEOUS) IMPLANT
SHEATH PINNACLE 5F 10CM (SHEATH) ×1 IMPLANT
SHEATH PINNACLE 7F 10CM (SHEATH) ×1 IMPLANT
SHEATH PINNACLE ST 7F 45CM (SHEATH) ×1 IMPLANT
STENT OMNILINK 7X29X135 (Permanent Stent) ×1 IMPLANT
STOPCOCK MORSE 400PSI 3WAY (MISCELLANEOUS) ×1 IMPLANT
SYR MEDRAD MARK V 150ML (SYRINGE) ×1 IMPLANT
TRANSDUCER W/STOPCOCK (MISCELLANEOUS) ×3 IMPLANT
TRAY PV CATH (CUSTOM PROCEDURE TRAY) ×3 IMPLANT
TUBING HIGH PRESSURE 120CM (CONNECTOR) ×2 IMPLANT
WIRE HITORQ VERSACORE ST 145CM (WIRE) ×1 IMPLANT
WIRE ROSEN-J .035X260CM (WIRE) ×1 IMPLANT

## 2014-08-04 NOTE — Discharge Instructions (Signed)
Brachial  Site Care Refer to this sheet in the next few weeks. These instructions provide you with information on caring for yourself after your procedure. Your caregiver may also give you more specific instructions. Your treatment has been planned according to current medical practices, but problems sometimes occur. Call your caregiver if you have any problems or questions after your procedure. HOME CARE INSTRUCTIONS  You may shower the day after the procedure.Remove the bandage (dressing) and gently wash the site with plain soap and water.Gently pat the site dry.  Do not apply powder or lotion to the site.  Do not submerge the affected site in water for 3 to 5 days.  Inspect the site at least twice daily.  Do not flex or bend the affected arm for 24 hours.  No lifting over 5 pounds (2.3 kg) for 5 days after your procedure.  Do not drive home if you are discharged the same day of the procedure. Have someone else drive you.  You may drive 24 hours after the procedure unless otherwise instructed by your caregiver.  Do not operate machinery or power tools for 24 hours.  A responsible adult should be with you for the first 24 hours after you arrive home. What to expect:  Any bruising will usually fade within 1 to 2 weeks.  Blood that collects in the tissue (hematoma) may be painful to the touch. It should usually decrease in size and tenderness within 1 to 2 weeks. SEEK IMMEDIATE MEDICAL CARE IF:  You have unusual pain at the radial site.  You have redness, warmth, swelling, or pain at the radial site.  You have drainage (other than a small amount of blood on the dressing).  You have chills.  You have a fever or persistent symptoms for more than 72 hours.  You have a fever and your symptoms suddenly get worse.  Your arm becomes pale, cool, tingly, or numb.  You have heavy bleeding from the site. Hold pressure on the site. CAll 911 Document Released: 03/15/2010 Document  Revised: 05/05/2011 Document Reviewed: 03/15/2010 [facility] Patient Information 2015 [place], [place]. This information is not intended to replace advice given to you by your health care provider. Make sure you discuss any questions you have with your health care provider.

## 2014-08-04 NOTE — Progress Notes (Signed)
Dr. Arbie Cookey by to see patient and reexamine rt upper arm. Patient good to return to [facility].

## 2014-08-04 NOTE — Interval H&P Note (Signed)
History and Physical Interval Note:  08/04/2014 7:28 AM  [name].  has presented today for surgery, with the diagnosis of pvd with bilateral lower extremity claudication  The various methods of treatment have been discussed with the patient and family. After consideration of risks, benefits and other options for treatment, the patient has consented to  Procedure(s): Abdominal Aortogram (N/A) as a surgical intervention .  The patient's history has been reviewed, patient examined, no change in status, stable for surgery.  I have reviewed the patient's chart and labs.  Questions were answered to the patient's satisfaction.     Fabienne Bruns

## 2014-08-04 NOTE — H&P (View-Only) (Signed)
[facility] [place] HISTORY AND PHYSICAL   History of Present Illness:  Patient is a 81 y.o. year old male who presents for evaluation of lower extremity pain. The patient previously had [place] undergone a right axillary bifemoral bypass as well as a right femoral to below-knee popliteal bypass. The axbifem was a Dacron graft. The femoropopliteal was a PTFE graft. This was in March 2013. Recently he has had more difficulty with pain in his thighs and calf after walking 1 block. He states the left leg is worse than the right. This is been going on for about 6 months. He denies rest pain. He has no evidence of nonhealing wounds. He has developed some progressive dementia. He has mainly sundowning type symptoms. He is able to complete his daily activities as well as work in his garden. He has significant short-term memory loss. Other medical problems include hyperlipidemia, coronary artery disease, benign prostatic hypertrophy, hypertension all of which are currently stable.  Past Medical History  Diagnosis Date  . Hyperlipidemia     takes Atorvastatin daily  . CAD (coronary artery disease)   . Gastritis   . BPH (benign prostatic hypertrophy)     Dr.Nesi is urologist  . Arthritis   . Claudication   . Hypertension     takes Ramipril and Metoprolol daily  . Heart murmur   . Asthma     as a young child  . Bruises easily     takes Pletal and ASA daily  . Colitis     hx of  . Hx of colonic polyps   . Urinary urgency     takes Flomax daily  . Diabetes mellitus     borderline diabetic  . Insomnia     takes Ambien nightly  . History of shingles 3-30yrs ago  . Blind right eye     "since age 40-4; S/P whooping cough"  . Mental disorder     takes Aricept daily  . DEMENTIA   . Peripheral vascular disease   . Amputation of great toe, left, traumatic   . Palpitations     we need to exclude atrial fib and ventricular arrhythmia.    Past Surgical History  Procedure Laterality  Date  . Colonoscopy    . Axillary-femoral bypass graft  05/05/2011    Procedure: BYPASS GRAFT AXILLA-BIFEMORAL;  Surgeon: Sherren Kerns, MD;  Location: [facility] OR;  Service: Vascular;  Laterality: Right;  . Femoral-popliteal bypass graft  05/05/2011    Procedure: BYPASS GRAFT FEMORAL-POPLITEAL ARTERY;  Surgeon: Sherren Kerns, MD;  Location: [facility] OR;  Service: Vascular;  Laterality: Right;  . Tonsillectomy  1960  . Aortic valve replacement  06/2003    /E-chart  . Band hemorrhoidectomy  1960's  . Ostectomy  09/2001    right hip/E-chart  . Cardiac catheterization      multiple-see epic  . Coronary angioplasty      right leg  . Coronary angioplasty with stent placement  2000    /E-chart  . Coronary artery bypass graft  1989/06/2003    CABG X 5; CABG X1  . Cardiac valve replacement  06/2003  . Cataract extraction      left eye  . Axillary-femoral bypass graft  06/10/2011    Procedure: BYPASS GRAFT AXILLA-BIFEMORAL;  Surgeon: Sherren Kerns, MD;  Location: [facility] OR;  Service: Vascular;  Laterality: Right;  REVISION Right axillary bifemoral bypass using a 8mm x 80 cm Propaten Graft  . I&d extremity  07/04/2011    Procedure: IRRIGATION AND DEBRIDEMENT EXTREMITY;  Surgeon: Loanne DrillingFrank V Aluisio, MD;  Location: [facility] OR;  Service: Orthopedics;  Laterality: Left;  . Amputation  07/04/2011    Procedure: AMPUTATION FOOT;  Surgeon: Loanne DrillingFrank V Aluisio, MD;  Location: [facility] OR;  Service: Orthopedics;  Laterality: Left;    Social History History  Substance Use Topics  . Smoking status: Former Smoker    Types: Cigarettes    Quit date: 02/25/1940  . Smokeless tobacco: Never Used  . Alcohol Use: No     Comment: "last alcohol was ~ 1950's"    Family History Family History  Problem Relation Age of Onset  . Heart disease Father   . Anesthesia problems Neg Hx   . Hypotension Neg Hx   . Malignant hyperthermia Neg Hx   . Pseudochol deficiency Neg Hx   . Cancer Mother     Allergies  Allergies  Allergen Reactions   . Shrimp [Shellfish Allergy] Nausea And Vomiting    "haven't eaten any since 1980's" pt only allergic to shrimp. Not shellfish     Current Outpatient Prescriptions  Medication Sig Dispense Refill  . aspirin EC 325 MG tablet Take 325 mg by mouth at bedtime.     Marland Kitchen. atorvastatin (LIPITOR) 20 MG tablet Take 1 tablet (20 mg total) by mouth daily. 30 tablet 5  . donepezil (ARICEPT) 10 MG tablet Take 10 mg by mouth at bedtime.     Marland Kitchen. esomeprazole (NEXIUM) 40 MG capsule Take 40 mg by mouth daily at 12 noon.    . ezetimibe (ZETIA) 10 MG tablet Take 10 mg by mouth daily.    . hydrOXYzine (ATARAX/VISTARIL) 10 MG tablet Take 1 tablet by mouth daily.    . meloxicam (MOBIC) 7.5 MG tablet Take 7.5 mg by mouth 2 (two) times daily as needed. for pain  2  . metoprolol succinate (TOPROL-XL) 25 MG 24 hr tablet Take 1 tablet (25 mg total) by mouth 2 (two) times daily.    Marland Kitchen. MYRBETRIQ 25 MG TB24 tablet Take 25 mg by mouth daily.  5  . nitroGLYCERIN (NITROSTAT) 0.4 MG SL tablet Place 0.4 mg under the tongue every 5 (five) minutes as needed. For chest pain    . oxybutynin (DITROPAN-XL) 10 MG 24 hr tablet Take 10 mg by mouth daily.    . ramipril (ALTACE) 5 MG tablet Take 5 mg by mouth daily.      . Tamsulosin HCl (FLOMAX) 0.4 MG CAPS Take 0.4 mg by mouth at bedtime.     . traMADol (ULTRAM) 50 MG tablet Take 1 tablet by mouth 2 (two) times daily.    . Vitamin D, Ergocalciferol, (DRISDOL) 50000 UNITS CAPS capsule Take 50,000 Units by mouth once a week. TUESDAY  5  . zolpidem (AMBIEN) 10 MG tablet Take 10 mg by mouth at bedtime as needed. For sleep    . cilostazol (PLETAL) 100 MG tablet Take 100 mg by mouth 2 (two) times daily.    . fluvastatin XL (LESCOL XL) 80 MG 24 hr tablet Take 80 mg by mouth at bedtime.    Marland Kitchen. PARoxetine (PAXIL) 10 MG tablet Take 1 tablet by mouth daily.    . VOLTAREN 1 % GEL 4 (four) times daily.     No current facility-administered medications for this visit.    ROS:   General:  No weight  loss, Fever, chills  HEENT: No recent headaches, no nasal bleeding, no visual changes, no sore throat  Neurologic: No dizziness,  blackouts, seizures. No recent symptoms of stroke or mini- stroke. No recent episodes of slurred speech, or temporary blindness.  Cardiac: No recent episodes of chest pain/pressure, no shortness of breath at rest.  No shortness of breath with exertion.  Denies history of atrial fibrillation or irregular heartbeat  Vascular: No history of rest pain in feet.  + history of claudication.  No history of non-healing ulcer, No history of DVT   Pulmonary: No home oxygen, no productive cough, no hemoptysis,  No asthma or wheezing  Musculoskeletal:  [ ]  Arthritis, [ ]  Low back pain,  [ ]  Joint pain  Hematologic:No history of hypercoagulable state.  No history of easy bleeding.  No history of anemia  Gastrointestinal: No hematochezia or melena,  No gastroesophageal reflux, no trouble swallowing  Urinary: [ ]  chronic Kidney disease, [ ]  on HD - [ ]  MWF or [ ]  TTHS, [ ]  Burning with urination, [ ]  Frequent urination, [ ]  Difficulty urinating;   Skin: No rashes  Psychological: No history of anxiety,  No history of depression   Physical Examination  Filed Vitals:   07/27/14 1522  BP: 146/65  Pulse: 62  Resp: 14  Height: 5\' 5"  (1.651 m)  Weight: 59.875 kg (132 lb)    Body mass index is 21.97 kg/(m^2).  General:  Alert and oriented, no acute distress HEENT: Normal Neck: No bruit or JVD, he does have a right infraclavicular bruit. Pulmonary: Clear to auscultation bilaterally Cardiac: Regular Rate and Rhythm without murmur Abdomen: Soft, non-tender, non-distended, no mass, palpable graft pulse in the axillary bypass along right chest wall  Skin: No rash Extremity Pulses:  Absent radial, 1-2+ brachial, 2+ femoral, 2+ right dorsalis pedis, absent dorsalis pedis, posterior tibial pulses left leg  Musculoskeletal: No deformity or edema  Neurologic: Upper and lower  extremity motor 5/5 and symmetric  DATA:  Patient had a graft duplex exam today which showed some recurrent narrowing of the left common femoral artery he has also developed narrowing in the right proximal subclavian artery proximal to the bypass graft the axbifem and the femoropopliteal were widely patent otherwise   ASSESSMENT:  Possible inflow stenosis which is feeding the lower extremity circulation. Also possible recurrent inflow/outflow stenosis left common femoral artery.   PLAN:  Aortogram with bilateral lower extremity runoff possible intervention. My approach will be an attempt from a right brachial puncture in order to do retrograde stenting of the right subclavian artery if necessary. If the patient does not have a solution from a percutaneous option we will need to have further discussions whether or not to consider an open operation in light of his age and chronic dementia. Risks benefits possible complications and procedure details of aortogram possible intervention were discussed with the patient and his wife today. These include but are not limited to bleeding infection contrast reaction vessel injury.  Fabienne Bruns, MD [facility] [place] Office: [phone] Pager: [phone]

## 2014-08-04 NOTE — Progress Notes (Signed)
Patient ID: [name]., male   DOB: 07-Feb-1925, 81 y.o.   MRN: [id number] Called to see her earlier with some hematoma in his right arm. Had right brachial approach. Denies any pain. Saw him about 1-1-1/2 hours ago. Fullness in his biceps area. No evidence of false aneurysm or hematoma at the puncture. does have a 2+ radial pulse.  On exam now. Patient has motor function is right handed. Does have a 2+ radial pulse. No change in the fullness in his upper arm.  Moderate to hematoma with no evidence of ongoing bleeding. Continue to watch. Should be okay for discharge later this evening as planned with Dr. Darrick Penna.

## 2014-08-04 NOTE — Progress Notes (Addendum)
7Fr sheath aspirated and removed from right brachial artery at 12:06:00 .  Manual pressure applied for 30 minutes. Site oozing, pressure maintained another 15 minutes. Puncture site still oozing , manual pressure continued until 13:06. Hemostasis achieved, tegaderm dressing applied.  While arm board was being applied, a hematoma formed at the bicep level of the right arm. Dr. Arbie Cookey called.  Hematoma size is a circular 5 inch area. Manual pressure applied to puncture site, a blood pressure cuff was applied to the hematoma site and inflated to while maintaining a pleth waveform as measured in the right thumb. Cuff left in place 10 minutes, until Dr. Arbie Cookey arrived and evaluated his arm. Dr. Arbie Cookey requested patient to remain in the holding area for site observation, he will re-evaluate in 1 hr.   Hematoma border marked with skin marker.

## 2014-08-07 LAB — POCT ACTIVATED CLOTTING TIME: Activated Clotting Time: 245 seconds

## 2014-08-07 MED FILL — Heparin Sodium (Porcine) 2 Unit/ML in Sodium Chloride 0.9%: INTRAMUSCULAR | Qty: 1000 | Status: AC

## 2014-08-11 ENCOUNTER — Telehealth: Payer: Self-pay | Admitting: Vascular Surgery

## 2014-08-11 NOTE — Telephone Encounter (Addendum)
-----   Message from Sharee Pimple, RN sent at 08/04/2014 11:04 AM EDT ----- Regarding: Schedule   ----- Message -----    From: Sherren Kerns, MD    Sent: 08/04/2014   9:30 AM      To: Vvs Charge Pool  [place] brachial artery Right brachial puncture Arch aortogram  Right subclavian angio Right subclavian stent.  Pt needs follow up upper extremity duplex, duplex of ax bifem and fem pop and ABI in 3 months  Fabienne Bruns, MD [facility] [place] Office: [phone] Pager: [phone]   notfied patient of fu appt. on 11-16-14 at 11:30 for labs then 2:00 to see dr. fields  as per staff message

## 2014-09-15 DIAGNOSIS — Y939 Activity, unspecified: Secondary | ICD-10-CM | POA: Insufficient documentation

## 2014-09-15 DIAGNOSIS — N400 Enlarged prostate without lower urinary tract symptoms: Secondary | ICD-10-CM | POA: Insufficient documentation

## 2014-09-15 DIAGNOSIS — J45909 Unspecified asthma, uncomplicated: Secondary | ICD-10-CM | POA: Insufficient documentation

## 2014-09-15 DIAGNOSIS — K2970 Gastritis, unspecified, without bleeding: Secondary | ICD-10-CM | POA: Diagnosis not present

## 2014-09-15 DIAGNOSIS — Z951 Presence of aortocoronary bypass graft: Secondary | ICD-10-CM | POA: Diagnosis not present

## 2014-09-15 DIAGNOSIS — T18128A Food in esophagus causing other injury, initial encounter: Secondary | ICD-10-CM | POA: Diagnosis not present

## 2014-09-15 DIAGNOSIS — E119 Type 2 diabetes mellitus without complications: Secondary | ICD-10-CM | POA: Diagnosis not present

## 2014-09-15 DIAGNOSIS — G4700 Insomnia, unspecified: Secondary | ICD-10-CM | POA: Insufficient documentation

## 2014-09-15 DIAGNOSIS — Z87891 Personal history of nicotine dependence: Secondary | ICD-10-CM | POA: Insufficient documentation

## 2014-09-15 DIAGNOSIS — R011 Cardiac murmur, unspecified: Secondary | ICD-10-CM | POA: Diagnosis not present

## 2014-09-15 DIAGNOSIS — I10 Essential (primary) hypertension: Secondary | ICD-10-CM | POA: Diagnosis not present

## 2014-09-15 DIAGNOSIS — Z79899 Other long term (current) drug therapy: Secondary | ICD-10-CM | POA: Insufficient documentation

## 2014-09-15 DIAGNOSIS — Y929 Unspecified place or not applicable: Secondary | ICD-10-CM | POA: Insufficient documentation

## 2014-09-15 DIAGNOSIS — Z7982 Long term (current) use of aspirin: Secondary | ICD-10-CM | POA: Insufficient documentation

## 2014-09-15 DIAGNOSIS — E785 Hyperlipidemia, unspecified: Secondary | ICD-10-CM | POA: Insufficient documentation

## 2014-09-15 DIAGNOSIS — F0390 Unspecified dementia without behavioral disturbance: Secondary | ICD-10-CM | POA: Insufficient documentation

## 2014-09-15 DIAGNOSIS — H5441 Blindness, right eye, normal vision left eye: Secondary | ICD-10-CM | POA: Insufficient documentation

## 2014-09-15 DIAGNOSIS — M1990 Unspecified osteoarthritis, unspecified site: Secondary | ICD-10-CM | POA: Diagnosis not present

## 2014-09-15 DIAGNOSIS — Z8619 Personal history of other infectious and parasitic diseases: Secondary | ICD-10-CM | POA: Diagnosis not present

## 2014-09-15 DIAGNOSIS — Z86010 Personal history of colonic polyps: Secondary | ICD-10-CM | POA: Diagnosis not present

## 2014-09-15 DIAGNOSIS — Z9889 Other specified postprocedural states: Secondary | ICD-10-CM | POA: Insufficient documentation

## 2014-09-15 DIAGNOSIS — Y999 Unspecified external cause status: Secondary | ICD-10-CM | POA: Insufficient documentation

## 2014-09-15 DIAGNOSIS — X58XXXA Exposure to other specified factors, initial encounter: Secondary | ICD-10-CM | POA: Diagnosis not present

## 2014-09-15 DIAGNOSIS — T189XXA Foreign body of alimentary tract, part unspecified, initial encounter: Secondary | ICD-10-CM | POA: Diagnosis present

## 2014-09-15 DIAGNOSIS — I2510 Atherosclerotic heart disease of native coronary artery without angina pectoris: Secondary | ICD-10-CM | POA: Diagnosis not present

## 2014-09-15 DIAGNOSIS — F99 Mental disorder, not otherwise specified: Secondary | ICD-10-CM | POA: Insufficient documentation

## 2014-09-15 DIAGNOSIS — Z9861 Coronary angioplasty status: Secondary | ICD-10-CM | POA: Insufficient documentation

## 2014-09-15 NOTE — ED Notes (Signed)
Patient here after eating a fish with bones at 2000.  Patient feels like he still has a bone in his throat.  No drooling, no shortness of breath, no chest pain per patient.  Patient is HOH.  Patient states that it just feels funny and that it is still there.  He said he tried to cough it up but unable to dislodge the bone.

## 2014-09-16 ENCOUNTER — Emergency Department (HOSPITAL_COMMUNITY): Payer: Medicare Other

## 2014-09-16 ENCOUNTER — Emergency Department (HOSPITAL_COMMUNITY)
Admission: EM | Admit: 2014-09-16 | Discharge: 2014-09-16 | Disposition: A | Discharge status: Home / Self Care | Payer: Medicare Other | Attending: Emergency Medicine | Admitting: Emergency Medicine

## 2014-09-16 ENCOUNTER — Encounter (HOSPITAL_COMMUNITY): Payer: Self-pay | Admitting: Emergency Medicine

## 2014-09-16 DIAGNOSIS — T18128A Food in esophagus causing other injury, initial encounter: Secondary | ICD-10-CM

## 2014-09-16 LAB — I-STAT CHEM 8, ED
BUN: 21 mg/dL — ABNORMAL HIGH (ref 6–20)
CHLORIDE: 103 mmol/L (ref 101–111)
Calcium, Ion: 1.22 mmol/L (ref 1.13–1.30)
Creatinine, Ser: 1.5 mg/dL — ABNORMAL HIGH (ref 0.61–1.24)
Glucose, Bld: 92 mg/dL (ref 65–99)
HEMATOCRIT: 41 % (ref 39.0–52.0)
Hemoglobin: 13.9 g/dL (ref 13.0–17.0)
POTASSIUM: 4.2 mmol/L (ref 3.5–5.1)
SODIUM: 143 mmol/L (ref 135–145)
TCO2: 26 mmol/L (ref 0–100)

## 2014-09-16 LAB — CBC WITH DIFFERENTIAL/PLATELET
Basophils Absolute: 0 10*3/uL (ref 0.0–0.1)
Basophils Relative: 0 % (ref 0–1)
EOS ABS: 0.3 10*3/uL (ref 0.0–0.7)
EOS PCT: 5 % (ref 0–5)
HCT: 36.7 % — ABNORMAL LOW (ref 39.0–52.0)
Hemoglobin: 12.2 g/dL — ABNORMAL LOW (ref 13.0–17.0)
LYMPHS ABS: 1.4 10*3/uL (ref 0.7–4.0)
LYMPHS PCT: 29 % (ref 12–46)
MCH: 30.7 pg (ref 26.0–34.0)
MCHC: 33.2 g/dL (ref 30.0–36.0)
MCV: 92.2 fL (ref 78.0–100.0)
MONO ABS: 0.5 10*3/uL (ref 0.1–1.0)
MONOS PCT: 9 % (ref 3–12)
NEUTROS PCT: 56 % (ref 43–77)
Neutro Abs: 2.7 10*3/uL (ref 1.7–7.7)
PLATELETS: 109 10*3/uL — AB (ref 150–400)
RBC: 3.98 MIL/uL — ABNORMAL LOW (ref 4.22–5.81)
RDW: 14.8 % (ref 11.5–15.5)
WBC: 4.8 10*3/uL (ref 4.0–10.5)

## 2014-09-16 MED ORDER — OMEPRAZOLE 20 MG PO CPDR: 20.0000 mg | DELAYED_RELEASE_CAPSULE | Freq: Every day | ORAL | Status: DC

## 2014-09-16 NOTE — ED Notes (Signed)
Pt left with all belongings and with family. Pt refused wheelchair.  

## 2014-09-16 NOTE — ED Notes (Signed)
Patient is resting comfortably. 

## 2014-09-16 NOTE — ED Provider Notes (Signed)
CSN: [id number]     Arrival date & time 09/15/14  2351 History   This chart was scribed for Derwood Kaplan, MD by Freida Busman, ED Scribe. This patient was seen in room B16C/B16C and the patient's care was started 2:25 AM.  Chief Complaint  Patient presents with  . Swallowed Foreign Body    The history is provided by the patient. No language interpreter was used.     HPI Comments:  Mark Caras. is a 81 y.o. male who presents to the Emergency Department complaining of a foreign body in his throat. Pt ate fish for dinner ~ 2000 last night, he believes he may have a fish bone stuck in his throat.  He denies pain, trouble breathing and difficulty swallowing. No alleviating factors or associated symptoms noted.   Past Medical History  Diagnosis Date  . Hyperlipidemia     takes Atorvastatin daily  . CAD (coronary artery disease)   . Gastritis   . BPH (benign prostatic hypertrophy)     Dr.Nesi is urologist  . Arthritis   . Claudication   . Hypertension     takes Ramipril and Metoprolol daily  . Heart murmur   . Asthma     as a young child  . Bruises easily     takes Pletal and ASA daily  . Colitis     hx of  . Hx of colonic polyps   . Urinary urgency     takes Flomax daily  . Diabetes mellitus     borderline diabetic  . Insomnia     takes Ambien nightly  . History of shingles 3-71yrs ago  . Blind right eye     "since age 24-4; S/P whooping cough"  . Mental disorder     takes Aricept daily  . DEMENTIA   . Peripheral vascular disease   . Amputation of great toe, left, traumatic   . Palpitations     we need to exclude atrial fib and ventricular arrhythmia.   Past Surgical History  Procedure Laterality Date  . Colonoscopy    . Axillary-femoral bypass graft  05/05/2011    Procedure: BYPASS GRAFT AXILLA-BIFEMORAL;  Surgeon: Sherren Kerns, MD;  Location: [facility] OR;  Service: Vascular;  Laterality: Right;  . Femoral-popliteal bypass graft  05/05/2011    Procedure: BYPASS  GRAFT FEMORAL-POPLITEAL ARTERY;  Surgeon: Sherren Kerns, MD;  Location: [facility] OR;  Service: Vascular;  Laterality: Right;  . Tonsillectomy  1960  . Aortic valve replacement  06/2003    /E-chart  . Band hemorrhoidectomy  1960's  . Ostectomy  09/2001    right hip/E-chart  . Cardiac catheterization      multiple-see epic  . Coronary angioplasty      right leg  . Coronary angioplasty with stent placement  2000    /E-chart  . Coronary artery bypass graft  1989/06/2003    CABG X 5; CABG X1  . Cardiac valve replacement  06/2003  . Cataract extraction      left eye  . Axillary-femoral bypass graft  06/10/2011    Procedure: BYPASS GRAFT AXILLA-BIFEMORAL;  Surgeon: Sherren Kerns, MD;  Location: [facility] OR;  Service: Vascular;  Laterality: Right;  REVISION Right axillary bifemoral bypass using a 8mm x 80 cm Propaten Graft  . I&d extremity  07/04/2011    Procedure: IRRIGATION AND DEBRIDEMENT EXTREMITY;  Surgeon: Loanne Drilling, MD;  Location: [facility] OR;  Service: Orthopedics;  Laterality: Left;  . Amputation  07/04/2011  Procedure: AMPUTATION FOOT;  Surgeon: Loanne Drilling, MD;  Location: [facility] OR;  Service: Orthopedics;  Laterality: Left;  . Peripheral vascular catheterization N/A 08/04/2014    Procedure: Aortic Arch Angiography;  Surgeon: Sherren Kerns, MD;  Location: [facility] INVASIVE CV LAB;  Service: Cardiovascular;  Laterality: N/A;  . Peripheral vascular catheterization Right 08/04/2014    Procedure: Upper Extremity Angiography;  Surgeon: Sherren Kerns, MD;  Location: [facility] INVASIVE CV LAB;  Service: Cardiovascular;  Laterality: Right;   Family History  Problem Relation Age of Onset  . Heart disease Father   . Anesthesia problems Neg Hx   . Hypotension Neg Hx   . Malignant hyperthermia Neg Hx   . Pseudochol deficiency Neg Hx   . Cancer Mother    History  Substance Use Topics  . Smoking status: Former Smoker    Types: Cigarettes    Quit date: 02/25/1940  . Smokeless tobacco: Never Used  .  Alcohol Use: No     Comment: "last alcohol was ~ 1950's"    Review of Systems  Constitutional:       + Foreign body   HENT: Negative for trouble swallowing.   Respiratory: Negative for shortness of breath.     A complete 10 system review of systems was obtained and all systems are negative except as noted in the HPI and PMH.    Allergies  Shrimp  Home Medications   Prior to Admission medications   Medication Sig Start Date End Date Taking? Authorizing Provider  aspirin EC 325 MG tablet Take 325 mg by mouth at bedtime.     Historical Provider, MD  atorvastatin (LIPITOR) 20 MG tablet Take 1 tablet (20 mg total) by mouth daily. 06/20/14   Lyn Records, MD  donepezil (ARICEPT) 10 MG tablet Take 10 mg by mouth at bedtime.     Historical Provider, MD  metoprolol succinate (TOPROL-XL) 25 MG 24 hr tablet Take 1 tablet (25 mg total) by mouth 2 (two) times daily. 06/20/14   Lyn Records, MD  MYRBETRIQ 25 MG TB24 tablet Take 25 mg by mouth daily. 05/21/14   Historical Provider, MD  nitroGLYCERIN (NITROSTAT) 0.4 MG SL tablet Place 0.4 mg under the tongue every 5 (five) minutes as needed. For chest pain    Historical Provider, MD  omeprazole (PRILOSEC) 20 MG capsule Take 1 capsule (20 mg total) by mouth daily. 09/16/14   Derwood Kaplan, MD  oxybutynin (DITROPAN-XL) 10 MG 24 hr tablet Take 10 mg by mouth daily. 07/03/11   Historical Provider, MD  PARoxetine (PAXIL) 10 MG tablet Take 1 tablet by mouth daily. 08/02/13   Historical Provider, MD  ramipril (ALTACE) 5 MG tablet Take 5 mg by mouth daily.      Historical Provider, MD  Tamsulosin HCl (FLOMAX) 0.4 MG CAPS Take 0.4 mg by mouth at bedtime.     Historical Provider, MD  tolterodine (DETROL LA) 4 MG 24 hr capsule Take 4 mg by mouth daily. 06/27/14   Historical Provider, MD  traMADol (ULTRAM) 50 MG tablet Take 1 tablet by mouth every 6 (six) hours as needed for moderate pain or severe pain.  05/10/13   Historical Provider, MD  Vitamin D, Ergocalciferol,  (DRISDOL) 50000 UNITS CAPS capsule Take 50,000 Units by mouth once a week. TUESDAY 06/12/14   Historical Provider, MD  VOLTAREN 1 % GEL Apply 2 g topically 4 (four) times daily as needed (pain).  02/08/13   Historical Provider, MD   BP 144/97  mmHg  Pulse 95  Temp(Src) 98.1 F (36.7 C) (Oral)  Resp 18  SpO2 97% Physical Exam  Constitutional: He is oriented to person, place, and time. He appears well-developed and well-nourished. No distress.  HENT:  Head: Normocephalic and atraumatic.  Eyes: Conjunctivae are normal.  Neck: Normal range of motion.  No bruits   Cardiovascular: Normal rate, regular rhythm and normal heart sounds.   Pulmonary/Chest: Effort normal and breath sounds normal. No stridor. No respiratory distress.  Musculoskeletal: Normal range of motion.  Neurological: He is alert and oriented to person, place, and time.  Skin: Skin is warm and dry.  Nursing note and vitals reviewed.   ED Course  Procedures   DIAGNOSTIC STUDIES:  Oxygen Saturation is 97% on RA, normal by my interpretation.    COORDINATION OF CARE:  2:29 AM Will order XR. Discussed treatment plan with pt at bedside and pt agreed to plan.  @4 :30 - normal xrays, normal exam. Oral swallow evaluation started. If he fails it, or symptoms persist, will get GI.  @5 :20 : when RN tired crackers and water - pt felt that he passed the foreign body. He feels normal again. Advised liquid diet x 1-2 days. Advised GI f/u to ensure he doesn't end up with any complications and to possibly get motility studies and to return to the ER if symptoms return. Pt and family happy with the plan  Labs Review Labs Reviewed  CBC WITH DIFFERENTIAL/PLATELET - Abnormal; Notable for the following:    RBC 3.98 (*)    Hemoglobin 12.2 (*)    HCT 36.7 (*)    Platelets 109 (*)    All other components within normal limits  I-STAT CHEM 8, ED - Abnormal; Notable for the following:    BUN 21 (*)    Creatinine, Ser 1.50 (*)    All other  components within normal limits    Imaging Review Dg Chest 2 View  09/16/2014   CLINICAL DATA:  Acute onset of generalized chest pain. Possible food impaction. Initial encounter.  EXAM: CHEST  2 VIEW  COMPARISON:  Chest radiograph from 07/04/2011  FINDINGS: The lungs are well-aerated. Minimal left basilar atelectasis or scarring is noted. There is no evidence of focal opacification, pleural effusion or pneumothorax.  The heart is borderline normal in size. The patient is status post median sternotomy. An aortic valve replacement is noted. No acute osseous abnormalities are seen. Diffuse calcification is noted along the proximal abdominal aorta.  IMPRESSION: Minimal left basilar atelectasis or scarring noted; lungs otherwise clear.   Electronically Signed   By: Roanna Raider M.D.   On: 09/16/2014 03:04     EKG Interpretation None      MDM   Final diagnoses:  Food impaction of esophagus, initial encounter    I personally performed the services described in this documentation, which was scribed in my presence. The recorded information has been reviewed and is accurate.   Pt with subjective food impaction sensation that started last evening. He is not drooling, and is not having any trouble swallowing - just fills like something is stuck. Will get xrays and reassess.   Derwood Kaplan, MD 09/16/14 [phone]

## 2014-09-16 NOTE — Discharge Instructions (Signed)
Seems like the food passed on its own. Please see the GI doctor to ensure there is no internal injury. Return to the ER if the symptoms get worse, or come back. Liquid diet today - slowly advance as tolerated.   Swallowed Foreign Body, Adult You have swallowed an object (foreign body). Once the foreign body has passed through the food tube (esophagus), which leads from the mouth to the stomach, it will usually continue through the body without problems. This is because the point where the esophagus enters into the stomach is the narrowest place through which the foreign body must pass. Sometimes the foreign body gets stuck. The most common type of foreign body obstruction in adults is food impaction. Many times, bones from fish or meat products may become lodged in the esophagus or injure the throat on the way down. When there is an object that obstructs the esophagus, the most obvious symptoms are pain and the inability to swallow normally. In some cases, foreign bodies that can be life threatening are swallowed. Examples of these are certain medications and illicit drugs. Often in these instances, patients are afraid of telling what they swallowed. However, it is extremely important to tell the emergency caregiver what was swallowed because life-saving treatment may be needed.  X-ray exams may be taken to find the location of the foreign body. However, some objects do not show up well or may be too small to be seen on an X-ray image. If the foreign body is too large or too sharp, it may be too dangerous to allow it to pass on its own. You may need to see a caregiver who specializes in the digestive system (gastroenterologist). In a few cases, a specialist may need to remove the object using a method called "endoscopy". This involves passing a thin, soft, flexible tube into the food pipe to locate and remove the object. Follow up with your primary doctor or the referral you were given by the emergency  caregiver. HOME CARE INSTRUCTIONS   If your caregiver says it is safe for you to eat, then only have liquids and soft foods until your symptoms improve.  Once you are eating normally:  Cut food into small pieces.  Remove small bones from food.  Remove large seeds and pits from fruit.  Chew your food well.  Do not talk, laugh, or engage in physical activity while eating or swallowing. SEEK MEDICAL CARE IF:  You develop worsening shortness of breath, uncontrollable coughing, chest pains or high fever, greater than 102 F (38.9 C).  You are unable to eat or drink or you feel that food is getting stuck in your throat.  You have choking symptoms or cannot stop drooling.  You develop abdominal pain, vomiting (especially of blood), or rectal bleeding. MAKE SURE YOU:   Understand these instructions.  Will watch your condition.  Will get help right away if you are not doing well or get worse. Document Released: 07/31/2009 Document Revised: 05/05/2011 Document Reviewed: 07/31/2009 [facility] Patient Information 2015 [place], [place]. This information is not intended to replace advice given to you by your health care provider. Make sure you discuss any questions you have with your health care provider.

## 2014-11-14 ENCOUNTER — Encounter: Payer: Self-pay | Admitting: Vascular Surgery

## 2014-11-16 ENCOUNTER — Ambulatory Visit (INDEPENDENT_AMBULATORY_CARE_PROVIDER_SITE_OTHER): Payer: Medicare Other | Admitting: Vascular Surgery

## 2014-11-16 ENCOUNTER — Ambulatory Visit (INDEPENDENT_AMBULATORY_CARE_PROVIDER_SITE_OTHER)
Admission: RE | Admit: 2014-11-16 | Discharge: 2014-11-16 | Disposition: A | Discharge status: Home / Self Care | Payer: Medicare Other | Source: Ambulatory Visit | Attending: Vascular Surgery | Admitting: Vascular Surgery

## 2014-11-16 ENCOUNTER — Encounter: Payer: Self-pay | Admitting: Vascular Surgery

## 2014-11-16 ENCOUNTER — Ambulatory Visit (HOSPITAL_COMMUNITY)
Admission: RE | Admit: 2014-11-16 | Discharge: 2014-11-16 | Disposition: A | Discharge status: Home / Self Care | Payer: Medicare Other | Source: Ambulatory Visit | Attending: Vascular Surgery | Admitting: Vascular Surgery

## 2014-11-16 VITALS — BP 161/56 | HR 51 | Temp 97.8°F | Resp 14 | Ht 65.5 in | Wt 141.0 lb

## 2014-11-16 DIAGNOSIS — Z48812 Encounter for surgical aftercare following surgery on the circulatory system: Secondary | ICD-10-CM | POA: Diagnosis present

## 2014-11-16 DIAGNOSIS — I739 Peripheral vascular disease, unspecified: Secondary | ICD-10-CM | POA: Diagnosis not present

## 2014-11-16 DIAGNOSIS — Z9862 Peripheral vascular angioplasty status: Secondary | ICD-10-CM

## 2014-11-16 DIAGNOSIS — I70208 Unspecified atherosclerosis of native arteries of extremities, other extremity: Secondary | ICD-10-CM

## 2014-11-16 DIAGNOSIS — Z95828 Presence of other vascular implants and grafts: Secondary | ICD-10-CM | POA: Diagnosis not present

## 2014-11-16 NOTE — Progress Notes (Signed)
Filed Vitals:   11/16/14 1259 11/16/14 1308  BP: 154/54 161/56  Pulse: 49 51  Temp: 97.8 F (36.6 C)   TempSrc: Oral   Resp: 14   Height: 5' 5.5" (1.664 m)   Weight: 141 lb (63.957 kg)   SpO2: 100%

## 2014-11-16 NOTE — Progress Notes (Signed)
POST OPERATIVE OFFICE NOTE    CC:  F/u for surgery  HPI:  Patient is a 81 y.o. year old male who presents for evaluation of lower extremity pain. The patient previously had [place] undergone a right axillary bifemoral bypass as well as a right femoral to below-knee popliteal bypass. The axbifem was a Dacron graft. The femoropopliteal was a PTFE graft. This was in March 2013. Recently he has had more difficulty with pain in his thighs and calf after walking 1 block. He states the left leg is worse than the right. This is been going on for about 6 months. He denies rest pain. He has no evidence of nonhealing wounds. He has developed some progressive dementia. He has mainly sundowning type symptoms. He is able to complete his daily activities as well as work in his garden. He has significant short-term memory loss. Other medical problems include hyperlipidemia, coronary artery disease, benign prostatic hypertrophy, hypertension all of which are currently stable.    Who is s/p Arch aortogram, right subclavian angiogram, right subclavian stent (7 x 29) balloon-expandable, ultrasound right brachial artery.  He still complains of bilateral thigh pain and has to rest after walking.  He has no ulcers or ischemic changes to both LE.    Allergies  Allergen Reactions  . Shrimp [Shellfish Allergy] Nausea And Vomiting    "haven't eaten any since 1980's" pt only allergic to shrimp. Not shellfish    Current Outpatient Prescriptions  Medication Sig Dispense Refill  . acetaminophen-codeine (TYLENOL #3) 300-30 MG per tablet Take 1 tablet by mouth 2 (two) times daily.  2  . aspirin EC 325 MG tablet Take 325 mg by mouth at bedtime.     Marland Kitchen atorvastatin (LIPITOR) 20 MG tablet Take 1 tablet (20 mg total) by mouth daily. 30 tablet 5  . donepezil (ARICEPT) 10 MG tablet Take 10 mg by mouth at bedtime.     . hydrOXYzine (ATARAX/VISTARIL) 10 MG tablet Take 10 mg by mouth 3 (three) times daily as needed.    . meloxicam (MOBIC)  7.5 MG tablet Take 7.5 mg by mouth daily.    . metoprolol succinate (TOPROL-XL) 25 MG 24 hr tablet Take 1 tablet (25 mg total) by mouth 2 (two) times daily.    Marland Kitchen oxybutynin (DITROPAN-XL) 10 MG 24 hr tablet Take 10 mg by mouth daily.    Marland Kitchen PARoxetine (PAXIL) 10 MG tablet Take 1 tablet by mouth daily.    . ramipril (ALTACE) 5 MG tablet Take 5 mg by mouth daily.      . Tamsulosin HCl (FLOMAX) 0.4 MG CAPS Take 0.4 mg by mouth at bedtime.     . Vitamin D, Ergocalciferol, (DRISDOL) 50000 UNITS CAPS capsule Take 50,000 Units by mouth once a week. TUESDAY  5  . MYRBETRIQ 25 MG TB24 tablet Take 25 mg by mouth daily.  5  . nitroGLYCERIN (NITROSTAT) 0.4 MG SL tablet Place 0.4 mg under the tongue every 5 (five) minutes as needed. For chest pain    . omeprazole (PRILOSEC) 20 MG capsule Take 1 capsule (20 mg total) by mouth daily. (Patient not taking: Reported on 11/16/2014) 30 capsule 0  . ramipril (ALTACE) 5 MG capsule Take 5 mg by mouth daily.  1  . tolterodine (DETROL LA) 4 MG 24 hr capsule Take 4 mg by mouth daily.  11  . traMADol (ULTRAM) 50 MG tablet Take 1 tablet by mouth every 6 (six) hours as needed for moderate pain or severe pain.     Marland Kitchen  VOLTAREN 1 % GEL Apply 2 g topically 4 (four) times daily as needed (pain).      No current facility-administered medications for this visit.     ROS:  See HPI  Physical Exam:  Filed Vitals:   11/16/14 1308  BP: 161/56  Pulse: 51  Temp:   Resp:     Incision:  Right upper extremity is well healed at stick site.  Palpable radial pulses equal bilaterally. Extremities:  Warm to touch without ischemic changes.  Active range of motion intact B LE. Neuro: sensation grossly intact bilaterally Vascular palpable Ax-fem graft pulses Heart RR Lungs CTA  ABI 11/16/2014 Right )0.90 Left 0.50 Duplex Ax fem-fem bypass graft Significant left common femoral artery stenosis below anastomosis.   Assessment/Plan:  This is a 81 y.o. male who is s/p: Arch aortogram,  right subclavian angiogram, right subclavian stent (7 x 29) balloon-expandable, ultrasound right brachial artery  He is able to perform ADL's with some pain that he takes PO pain medication for.  He has no significant changes left LE and improvement on the right LE after the angiogram with intervention.  If he develops pain that is not controlled and or ulcers he will call us.  Otherwise we will repeat the above ABI and bypass duplex in 1 year.    Thomasena Edis EMMA [facility] PA-C [facility] [phone]  Clinic MD:  Pt seen and examined with Dr. Darrick Penna  History and exam details as above. Patient with recent right subclavian stent to improve inflow for his axbifem. Overall he is doing well although he does have some dementia. He is on a minimally ambulatory and has some weakness in his legs but it is difficult to determine whether or not this is actually claudication. He has reasonably perfused limbs bilaterally. He will follow-up with repeat ABIs and duplex in 1 year.  Fabienne Bruns, MD [facility] Office: [phone] Pager: [phone]

## 2014-11-17 NOTE — Addendum Note (Signed)
Addended by: Melodye Ped C on: 11/17/2014 12:57 PM   Modules accepted: Orders

## 2015-02-10 ENCOUNTER — Other Ambulatory Visit: Payer: Self-pay | Admitting: Interventional Cardiology

## 2015-03-02 ENCOUNTER — Other Ambulatory Visit: Payer: Self-pay | Admitting: Interventional Cardiology

## 2015-03-02 MED ORDER — ATORVASTATIN CALCIUM 20 MG PO TABS: 20.0000 mg | ORAL_TABLET | Freq: Every day | ORAL | Status: DC

## 2015-03-02 NOTE — Telephone Encounter (Signed)
AVS Reports     Date/Time Report Action User    06/20/2014 12:29 PM After Visit Summary Printed Jarvis NewcomerLisa S Parris-Godley, CMA      Patient Instructions     Medication Instructions:  Your physician has recommended you make the following change in your medication: 1) INCREASE Atorvastatin to 20mg  daily 2) STOP Zetia 3) DECREASE Metoprolol to 25mg  twice daily  Labwork: Your physician recommends that you return for a FASTING lipid profile and alt in 6-8 weeks  Testing/Procedures: None   Follow-Up: Your physician wants you to follow-up in: 1 year with Dr.Smith You will receive a reminder letter in the mail two months in advance. If you don't receive a letter, please call our office to schedule the follow-up appointment.   Any Other Special Instructions Will Be Listed Below (If Applicable).   atorvastatin (LIPITOR) 20 MG tablet  Medication   Date: 06/20/2014  [facility] [address]  Ordering/Authorizing: Lyn RecordsHenry W Smith, MD      Order Providers    Prescribing Provider Encounter Provider   Lyn RecordsHenry W Smith, MD Lyn RecordsHenry W Smith, MD    Medication Detail      Disp Refills Start End     atorvastatin (LIPITOR) 20 MG tablet 30 tablet 5 06/20/2014     Sig - Route: Take 1 tablet (20 mg total) by mouth daily. - Oral    E-Prescribing Status: Receipt confirmed by pharmacy (06/20/2014 12:29 PM EDT)     Pharmacy    [id number] - [place], [place] - [address]

## 2015-06-28 ENCOUNTER — Other Ambulatory Visit: Payer: Self-pay | Admitting: Interventional Cardiology

## 2015-07-31 ENCOUNTER — Encounter: Payer: Self-pay | Admitting: Podiatry

## 2015-07-31 ENCOUNTER — Ambulatory Visit (INDEPENDENT_AMBULATORY_CARE_PROVIDER_SITE_OTHER): Payer: Medicare Other | Admitting: Podiatry

## 2015-07-31 VITALS — BP 155/77 | HR 55 | Resp 14

## 2015-07-31 DIAGNOSIS — I739 Peripheral vascular disease, unspecified: Secondary | ICD-10-CM | POA: Diagnosis not present

## 2015-07-31 DIAGNOSIS — M79673 Pain in unspecified foot: Secondary | ICD-10-CM | POA: Diagnosis not present

## 2015-07-31 DIAGNOSIS — B351 Tinea unguium: Secondary | ICD-10-CM

## 2015-07-31 NOTE — Progress Notes (Signed)
   Subjective:    Patient ID: Mark PackerLonnie E Tompkins Jr., male    DOB: 08/18/1924, 82 y.o.   MRN: [id number]  HPI   This patient presents today complaining of a several year history of increasing thickening and elongated toenails which are comfortable and walking wearing shoes. Patient's wife is attempted to trim the toenails, however, the nails have become deformed and thickened and she's not able to trim the nails at this time.  Patient's wife states that her husband has a history of circulation problems and believes he may of had peripheral vascular surgery in 1 extremity. Patient denies any open lesions History of claudication History of traumatic amputation left hallux associated with lawnmower injury   Review of Systems  HENT: Positive for hearing loss.   Cardiovascular:       Calf pain when walking  Endocrine: Positive for cold intolerance.  Musculoskeletal: Positive for gait problem.  Neurological:       Memory loss  Psychiatric/Behavioral: Positive for hallucinations and confusion.       Objective:   Physical Exam  [name] hearing orientated 3 patient presents with wife and treatment room helping him respond to questioning because of apparent hearing difficulty  Vascular: DP right 0/4 and DP left 2/4 PT pulses 2/4 right 0/4 left Capillary reflex delay bilaterally  Neurological: Sensation to 10 g monofilament wire intact 5/5 right 4/5 left Vibratory sensation reactive right nonreactive left Ankle reflex equal and reactive bilaterally  Dermatological: No open skin lesions bilaterally Well-healed surgical scar medial right lower leg The toenails are elongated, brittle, deformed and tender to direct palpation 6-10  Musculoskeletal: Amputated left hallux Hammertoe second left No restriction ankle, subtalar, midtarsal joints bilaterally      Assessment & Plan:   Assessment: Peripheral arterial disease Mycotic toenails 6-10  Plan: Debridement of toenails 6-10  mechanical the and electrical without any bleeding  Reappoint 3-4 month intervals or at patient's request

## 2015-08-02 ENCOUNTER — Ambulatory Visit (INDEPENDENT_AMBULATORY_CARE_PROVIDER_SITE_OTHER): Payer: Medicare Other | Admitting: Interventional Cardiology

## 2015-08-02 ENCOUNTER — Encounter: Payer: Self-pay | Admitting: Interventional Cardiology

## 2015-08-02 VITALS — BP 146/60 | HR 67 | Ht 65.0 in | Wt 141.2 lb

## 2015-08-02 DIAGNOSIS — E785 Hyperlipidemia, unspecified: Secondary | ICD-10-CM

## 2015-08-02 DIAGNOSIS — I739 Peripheral vascular disease, unspecified: Secondary | ICD-10-CM

## 2015-08-02 DIAGNOSIS — Z954 Presence of other heart-valve replacement: Secondary | ICD-10-CM | POA: Diagnosis not present

## 2015-08-02 DIAGNOSIS — Z953 Presence of xenogenic heart valve: Secondary | ICD-10-CM

## 2015-08-02 DIAGNOSIS — I25810 Atherosclerosis of coronary artery bypass graft(s) without angina pectoris: Secondary | ICD-10-CM

## 2015-08-02 NOTE — Progress Notes (Signed)
Cardiology Office Note    Date:  08/02/2015   ID:  [name]., DOB 12-Nov-1924, MRN [id number]  PCP:  Dorrene German, MD  Cardiologist: Lesleigh Noe, MD   Chief Complaint  Patient presents with  . Coronary Artery Disease    History of Present Illness:  [name]. is a 82 y.o. male PAD, CAD, hypertension, and aortic valve replacement.  Mr. [name]  is doing well. He has difficulty with ambulating due to spine and vascular issues. He did ambulate him without a walker. He has not had angina. He denies any palpitations or episodes of syncope. His memory is decreasing. He is accompanied by his wife. There is some dyspnea on exertion. If he takes his time there is no particular problem.    Past Medical History  Diagnosis Date  . Hyperlipidemia     takes Atorvastatin daily  . CAD (coronary artery disease)   . Gastritis   . BPH (benign prostatic hypertrophy)     Dr.Nesi is urologist  . Arthritis   . Claudication (HCC)   . Hypertension     takes Ramipril and Metoprolol daily  . Heart murmur   . Asthma     as a young child  . Bruises easily     takes Pletal and ASA daily  . Colitis     hx of  . Hx of colonic polyps   . Urinary urgency     takes Flomax daily  . Diabetes mellitus     borderline diabetic  . Insomnia     takes Ambien nightly  . History of shingles 3-62yrs ago  . Blind right eye     "since age 26-4; S/P whooping cough"  . Mental disorder     takes Aricept daily  . DEMENTIA   . Peripheral vascular disease (HCC)   . Amputation of great toe, left, traumatic (HCC)   . Palpitations     we need to exclude atrial fib and ventricular arrhythmia.    Past Surgical History  Procedure Laterality Date  . Colonoscopy    . Axillary-femoral bypass graft  05/05/2011    Procedure: BYPASS GRAFT AXILLA-BIFEMORAL;  Surgeon: Sherren Kerns, MD;  Location: [facility] OR;  Service: Vascular;  Laterality: Right;  . Femoral-popliteal bypass graft  05/05/2011   Procedure: BYPASS GRAFT FEMORAL-POPLITEAL ARTERY;  Surgeon: Sherren Kerns, MD;  Location: [facility] OR;  Service: Vascular;  Laterality: Right;  . Tonsillectomy  1960  . Aortic valve replacement  06/2003    /E-chart  . Band hemorrhoidectomy  1960's  . Ostectomy  09/2001    right hip/E-chart  . Cardiac catheterization      multiple-see epic  . Coronary angioplasty      right leg  . Coronary angioplasty with stent placement  2000    /E-chart  . Coronary artery bypass graft  1989/06/2003    CABG X 5; CABG X1  . Cardiac valve replacement  06/2003  . Cataract extraction      left eye  . Axillary-femoral bypass graft  06/10/2011    Procedure: BYPASS GRAFT AXILLA-BIFEMORAL;  Surgeon: Sherren Kerns, MD;  Location: [facility] OR;  Service: Vascular;  Laterality: Right;  REVISION Right axillary bifemoral bypass using a 8mm x 80 cm Propaten Graft  . I&d extremity  07/04/2011    Procedure: IRRIGATION AND DEBRIDEMENT EXTREMITY;  Surgeon: Loanne Drilling, MD;  Location: [facility] OR;  Service: Orthopedics;  Laterality: Left;  . Amputation  07/04/2011    Procedure: AMPUTATION FOOT;  Surgeon: Loanne Drilling, MD;  Location: [facility] OR;  Service: Orthopedics;  Laterality: Left;  . Peripheral vascular catheterization N/A 08/04/2014    Procedure: Aortic Arch Angiography;  Surgeon: Sherren Kerns, MD;  Location: [facility] INVASIVE CV LAB;  Service: Cardiovascular;  Laterality: N/A;  . Peripheral vascular catheterization Right 08/04/2014    Procedure: Upper Extremity Angiography;  Surgeon: Sherren Kerns, MD;  Location: [facility] INVASIVE CV LAB;  Service: Cardiovascular;  Laterality: Right;    Current Medications: Outpatient Prescriptions Prior to Visit  Medication Sig Dispense Refill  . acetaminophen-codeine (TYLENOL #3) 300-30 MG per tablet Take 1 tablet by mouth 2 (two) times daily.  2  . aspirin EC 325 MG tablet Take 325 mg by mouth at bedtime.     Marland Kitchen atorvastatin (LIPITOR) 20 MG tablet Take 1 tablet (20 mg total) by mouth daily. 30  tablet 3  . donepezil (ARICEPT) 10 MG tablet Take 10 mg by mouth at bedtime.     . hydrOXYzine (ATARAX/VISTARIL) 10 MG tablet Take 10 mg by mouth 3 (three) times daily as needed.    . meloxicam (MOBIC) 7.5 MG tablet Take 7.5 mg by mouth daily.    . metoprolol succinate (TOPROL-XL) 25 MG 24 hr tablet Take 1 tablet (25 mg total) by mouth 2 (two) times daily.    . nitroGLYCERIN (NITROSTAT) 0.4 MG SL tablet Place 0.4 mg under the tongue every 5 (five) minutes as needed. For chest pain    . omeprazole (PRILOSEC) 20 MG capsule Take 1 capsule (20 mg total) by mouth daily. 30 capsule 0  . oxybutynin (DITROPAN-XL) 10 MG 24 hr tablet Take 10 mg by mouth daily.    Marland Kitchen PARoxetine (PAXIL) 10 MG tablet Take 1 tablet by mouth daily.    . ramipril (ALTACE) 5 MG capsule Take 5 mg by mouth daily.  1  . Tamsulosin HCl (FLOMAX) 0.4 MG CAPS Take 0.4 mg by mouth at bedtime.     . traMADol (ULTRAM) 50 MG tablet Take 1 tablet by mouth every 6 (six) hours as needed for moderate pain or severe pain.     . Vitamin D, Ergocalciferol, (DRISDOL) 50000 UNITS CAPS capsule Take 50,000 Units by mouth once a week. TUESDAY  5  . VOLTAREN 1 % GEL Apply 2 g topically 4 (four) times daily as needed (pain).     . metoprolol succinate (TOPROL-XL) 25 MG 24 hr tablet TAKE 1 AND 1/2 TABLETS BY MOUTH TWICE A DAY 270 tablet 1  . metoprolol succinate (TOPROL-XL) 25 MG 24 hr tablet TAKE 1 AND 1/2 TABLETS BY MOUTH TWICE A DAY 270 tablet 3  . ramipril (ALTACE) 5 MG tablet Take 5 mg by mouth daily.       No facility-administered medications prior to visit.     Allergies:   Shrimp   Social History   Social History  . Marital Status: Married    Spouse Name: Camille Thau  . Number of Children: 3  . Years of Education: BS   Occupational History  . retired    Social History Main Topics  . Smoking status: Former Smoker    Types: Cigarettes    Quit date: 02/25/1940  . Smokeless tobacco: Never Used  . Alcohol Use: No     Comment:  "last alcohol was ~ 1950's"  . Drug Use: No  . Sexual Activity: Not Currently   Other Topics Concern  . None   Social History Narrative  Patient lives at home with his spouse.   Caffeine Use: none     Family History:  The patient's family history includes Cancer in his mother; Heart disease in his father. There is no history of Anesthesia problems, Hypotension, Malignant hyperthermia, or Pseudochol deficiency.   ROS:   Please see the history of present illness.    Difficulty with balance, leg pain, and fatigue.  All other systems reviewed and are negative.   PHYSICAL EXAM:   VS:  BP 146/60 mmHg  Pulse 67  Ht 5\' 5"  (1.651 m)  Wt 141 lb 3.2 oz (64.048 kg)  BMI 23.50 kg/m2   GEN: Well nourished, well developed, in no acute distress HEENT: normal Neck: no JVD, carotid bruits, or masses Cardiac: RRR; no murmurs, rubs, or gallops,no edema  Respiratory:  clear to auscultation bilaterally, normal work of breathing GI: soft, nontender, nondistended, + BS MS: no deformity or atrophy Skin: warm and dry, no rash Neuro:  Alert and Oriented x 3, Strength and sensation are intact Psych: euthymic mood, full affect  Wt Readings from Last 3 Encounters:  08/02/15 141 lb 3.2 oz (64.048 kg)  11/16/14 141 lb (63.957 kg)  08/04/14 138 lb (62.596 kg)      Studies/Labs Reviewed:   EKG:  EKG  Reveals sinus bradycardia, PVCs and a random pattern. 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  Recent Labs: 09/16/2014: BUN 21*; Creatinine, Ser 1.50*; Hemoglobin 13.9; Platelets 109*; Potassium 4.2; Sodium 143   Lipid Panel    Component Value Date/Time   CHOL 120 08/03/2013 0833   TRIG 67.0 08/03/2013 0833   HDL 46.40 08/03/2013 0833   CHOLHDL 3 08/03/2013 0833   VLDL 13.4 08/03/2013 0833   LDLCALC 60 08/03/2013 0833    Additional studies/ records that were reviewed today include:  No new functional data is noted. I did review the vascular Doppler studies from  September 22.   ASSESSMENT:    1. S/P aortic valve replacement with bioprosthetic valve   2. Coronary artery disease involving coronary bypass graft of native heart without angina pectoris   3. Hyperlipidemia   4. PAD (peripheral artery disease) (HCC)   5. Intermittent claudication (HCC)      PLAN:  In order of problems listed above:  1. No significant evidence of valve dysfunction. 2. No symptoms to suggest angina or recent ischemic event. 3. Followed by his primary care physician. 4. Stable with claudication but no other issues.    Medication Adjustments/Labs and Tests Ordered: Current medicines are reviewed at length with the patient today.  Concerns regarding medicines are outlined above.  Medication changes, Labs and Tests ordered today are listed in the Patient Instructions below. There are no Patient Instructions on file for this visit.   Signed, Lesleigh NoeHenry W Dae Antonucci III, MD  08/02/2015 2:09 PM    [facility] [address], [place], [place]  [address] Phone: [phone]; Fax: [phone]

## 2015-08-02 NOTE — Patient Instructions (Signed)

## 2015-10-11 ENCOUNTER — Other Ambulatory Visit: Payer: Self-pay | Admitting: Interventional Cardiology

## 2015-11-16 ENCOUNTER — Encounter: Payer: Self-pay | Admitting: Family

## 2015-11-22 ENCOUNTER — Ambulatory Visit (HOSPITAL_COMMUNITY)
Admission: RE | Admit: 2015-11-22 | Discharge: 2015-11-22 | Disposition: A | Discharge status: Home / Self Care | Payer: Medicare Other | Source: Ambulatory Visit | Attending: Vascular Surgery | Admitting: Vascular Surgery

## 2015-11-22 ENCOUNTER — Encounter: Payer: Self-pay | Admitting: Family

## 2015-11-22 ENCOUNTER — Ambulatory Visit (INDEPENDENT_AMBULATORY_CARE_PROVIDER_SITE_OTHER)
Admission: RE | Admit: 2015-11-22 | Discharge: 2015-11-22 | Disposition: A | Discharge status: Home / Self Care | Payer: Medicare Other | Source: Ambulatory Visit | Attending: Vascular Surgery | Admitting: Vascular Surgery

## 2015-11-22 ENCOUNTER — Ambulatory Visit (INDEPENDENT_AMBULATORY_CARE_PROVIDER_SITE_OTHER): Payer: Medicare Other | Admitting: Family

## 2015-11-22 VITALS — BP 197/73 | HR 54 | Temp 97.1°F | Resp 16 | Ht 65.0 in | Wt 139.0 lb

## 2015-11-22 DIAGNOSIS — E785 Hyperlipidemia, unspecified: Secondary | ICD-10-CM | POA: Diagnosis not present

## 2015-11-22 DIAGNOSIS — Z95828 Presence of other vascular implants and grafts: Secondary | ICD-10-CM | POA: Insufficient documentation

## 2015-11-22 DIAGNOSIS — R030 Elevated blood-pressure reading, without diagnosis of hypertension: Secondary | ICD-10-CM

## 2015-11-22 DIAGNOSIS — I739 Peripheral vascular disease, unspecified: Secondary | ICD-10-CM

## 2015-11-22 DIAGNOSIS — I10 Essential (primary) hypertension: Secondary | ICD-10-CM | POA: Insufficient documentation

## 2015-11-22 DIAGNOSIS — I779 Disorder of arteries and arterioles, unspecified: Secondary | ICD-10-CM | POA: Diagnosis not present

## 2015-11-22 DIAGNOSIS — IMO0001 Reserved for inherently not codable concepts without codable children: Secondary | ICD-10-CM

## 2015-11-22 DIAGNOSIS — R0989 Other specified symptoms and signs involving the circulatory and respiratory systems: Secondary | ICD-10-CM

## 2015-11-22 NOTE — Patient Instructions (Signed)
Peripheral Vascular Disease Peripheral vascular disease (PVD) is a disease of the blood vessels that are not part of your heart and brain. A simple term for PVD is poor circulation. In most cases, PVD narrows the blood vessels that carry blood from your heart to the rest of your body. This can result in a decreased supply of blood to your arms, legs, and internal organs, like your stomach or kidneys. However, it most often affects a person's lower legs and feet. There are two types of PVD.  Organic PVD. This is the more common type. It is caused by damage to the structure of blood vessels.  Functional PVD. This is caused by conditions that make blood vessels contract and tighten (spasm). Without treatment, PVD tends to get worse over time. PVD can also lead to acute ischemic limb. This is when an arm or limb suddenly has trouble getting enough blood. This is a medical emergency. CAUSES Each type of PVD has many different causes. The most common cause of PVD is buildup of a fatty material (plaque) inside of your arteries (atherosclerosis). Small amounts of plaque can break off from the walls of the blood vessels and become lodged in a smaller artery. This blocks blood flow and can cause acute ischemic limb. Other common causes of PVD include:  Blood clots that form inside of blood vessels.  Injuries to blood vessels.  Diseases that cause inflammation of blood vessels or cause blood vessel spasms.  Health behaviors and health history that increase your risk of developing PVD. RISK FACTORS  You may have a greater risk of PVD if you:  Have a family history of PVD.  Have certain medical conditions, including:  High cholesterol.  Diabetes.  High blood pressure (hypertension).  Coronary heart disease.  Past problems with blood clots.  Past injury, such as burns or a broken bone. These may have damaged blood vessels in your limbs.  Buerger disease. This is caused by inflamed blood  vessels in your hands and feet.  Some forms of arthritis.  Rare birth defects that affect the arteries in your legs.  Use tobacco.  Do not get enough exercise.  Are obese.  Are age 50 or older. SIGNS AND SYMPTOMS  PVD may cause many different symptoms. Your symptoms depend on what part of your body is not getting enough blood. Some common signs and symptoms include:  Cramps in your lower legs. This may be a symptom of poor leg circulation (claudication).  Pain and weakness in your legs while you are physically active that goes away when you rest (intermittent claudication).  Leg pain when at rest.  Leg numbness, tingling, or weakness.  Coldness in a leg or foot, especially when compared with the other leg.  Skin or hair changes. These can include:  Hair loss.  Shiny skin.  Pale or bluish skin.  Thick toenails.  Inability to get or maintain an erection (erectile dysfunction). People with PVD are more prone to developing ulcers and sores on their toes, feet, or legs. These may take longer than normal to heal. DIAGNOSIS Your health care provider may diagnose PVD from your signs and symptoms. The health care provider will also do a physical exam. You may have tests to find out what is causing your PVD and determine its severity. Tests may include:  Blood pressure recordings from your arms and legs and measurements of the strength of your pulses (pulse volume recordings).  Imaging studies using sound waves to take pictures of   the blood flow through your blood vessels (Doppler ultrasound).  Injecting a dye into your blood vessels before having imaging studies using:  X-rays (angiogram or arteriogram).  Computer-generated X-rays (CT angiogram).  A powerful electromagnetic field and a computer (magnetic resonance angiogram or MRA). TREATMENT Treatment for PVD depends on the cause of your condition and the severity of your symptoms. It also depends on your age. Underlying  causes need to be treated and controlled. These include long-lasting (chronic) conditions, such as diabetes, high cholesterol, and high blood pressure. You may need to first try making lifestyle changes and taking medicines. Surgery may be needed if these do not work. Lifestyle changes may include:  Quitting smoking.  Exercising regularly.  Following a low-fat, low-cholesterol diet. Medicines may include:  Blood thinners to prevent blood clots.  Medicines to improve blood flow.  Medicines to improve your blood cholesterol levels. Surgical procedures may include:  A procedure that uses an inflated balloon to open a blocked artery and improve blood flow (angioplasty).  A procedure to put in a tube (stent) to keep a blocked artery open (stent implant).  Surgery to reroute blood flow around a blocked artery (peripheral bypass surgery).  Surgery to remove dead tissue from an infected wound on the affected limb.  Amputation. This is surgical removal of the affected limb. This may be necessary in cases of acute ischemic limb that are not improved through medical or surgical treatments. HOME CARE INSTRUCTIONS  Take medicines only as directed by your health care provider.  Do not use any tobacco products, including cigarettes, chewing tobacco, or electronic cigarettes. If you need help quitting, ask your health care provider.  Lose weight if you are overweight, and maintain a healthy weight as directed by your health care provider.  Eat a diet that is low in fat and cholesterol. If you need help, ask your health care provider.  Exercise regularly. Ask your health care provider to suggest some good activities for you.  Use compression stockings or other mechanical devices as directed by your health care provider.  Take good care of your feet.  Wear comfortable shoes that fit well.  Check your feet often for any cuts or sores. SEEK MEDICAL CARE IF:  You have cramps in your legs  while walking.  You have leg pain when you are at rest.  You have coldness in a leg or foot.  Your skin changes.  You have erectile dysfunction.  You have cuts or sores on your feet that are not healing. SEEK IMMEDIATE MEDICAL CARE IF:  Your arm or leg turns cold and blue.  Your arms or legs become red, warm, swollen, painful, or numb.  You have chest pain or trouble breathing.  You suddenly have weakness in your face, arm, or leg.  You become very confused or lose the ability to speak.  You suddenly have a very bad headache or lose your vision.   This information is not intended to replace advice given to you by your health care provider. Make sure you discuss any questions you have with your health care provider.   Document Released: 03/20/2004 Document Revised: 03/03/2014 Document Reviewed: 07/21/2013 Elsevier Interactive Patient Education 2016 [employer]  

## 2015-11-22 NOTE — Progress Notes (Signed)
[facility] [place]   CC: Follow up peripheral artery occlusive disease  History of Present Illness Mark PoreLonnie E Sinclair ShipGoode Jr. is a 82 y.o. male patient of Dr. Darrick PennaFields who presents for follow up evaluation of peripheral artery occlusive disease. The patient previously had undergone a right axillary bifemoral bypass as well as a right femoral to below-knee popliteal bypass. The axbifem was a Dacron graft. The femoropopliteal was a PTFE graft. This was in March 2013. He is also s/p Arch aortogram, right subclavian angiogram, right subclavian stent (7 x 29) balloon-expandable, ultrasound right brachial artery, to improve inflow for his axbifem bypass.   He has no evidence of nonhealing wounds. He has developed some progressive dementia. He has mainly sundowning type symptoms. He is able to complete his daily activities as well as work in his garden. He has significant short-term memory loss. Other medical problems include hyperlipidemia, coronary artery disease, benign prostatic hypertrophy, and hypertension.  He was last evaluated by Dr. Darrick PennaFields and Judie PetitM. Collins PA-C on 11/16/14. At that time he had a recent right subclavian stent to improve inflow for his axbifem bypass. Overall he was doing well although he did have some dementia. He is minimally ambulatory and has some weakness in his legs but it is difficult to determine whether or not this is actually claudication. He had reasonably perfused limbs bilaterally. He was to follow-up with repeat ABIs and duplex in 1 year. He returns today for this.   He does not walk much according to his wife, gets generally tired. Wife denies any known hx of a stroke for her husband.   Pt Diabetic: No Pt smoker: non-smoker  Pt meds include: Statin :Yes Betablocker: Yes ASA: Yes Other anticoagulants/antiplatelets: no   Past Medical History:  Diagnosis Date  . Amputation of great toe, left, traumatic (HCC)   . Arthritis   . Asthma    as a  young child  . Blind right eye    "since age 89-4; S/P whooping cough"  . BPH (benign prostatic hypertrophy)    Dr.Nesi is urologist  . Bruises easily    takes Pletal and ASA daily  . CAD (coronary artery disease)   . Claudication (HCC)   . Colitis    hx of  . DEMENTIA   . Diabetes mellitus    borderline diabetic  . Gastritis   . Heart murmur   . History of shingles 3-6675yrs ago  . Hx of colonic polyps   . Hyperlipidemia    takes Atorvastatin daily  . Hypertension    takes Ramipril and Metoprolol daily  . Insomnia    takes Ambien nightly  . Mental disorder    takes Aricept daily  . Palpitations    we need to exclude atrial fib and ventricular arrhythmia.  . Peripheral vascular disease (HCC)   . Urinary urgency    takes Flomax daily    Social History Social History  Substance Use Topics  . Smoking status: Former Smoker    Types: Cigarettes    Quit date: 02/25/1940  . Smokeless tobacco: Never Used  . Alcohol use No     Comment: "last alcohol was ~ 1950's"    Family History Family History  Problem Relation Age of Onset  . Heart disease Father   . Anesthesia problems Neg Hx   . Hypotension Neg Hx   . Malignant hyperthermia Neg Hx   . Pseudochol deficiency Neg Hx   . Cancer Mother     Past  Surgical History:  Procedure Laterality Date  . AMPUTATION  07/04/2011   Procedure: AMPUTATION FOOT;  Surgeon: Loanne Drilling, MD;  Location: [facility] OR;  Service: Orthopedics;  Laterality: Left;  . AORTIC VALVE REPLACEMENT  06/2003   /E-chart  . AXILLARY-FEMORAL BYPASS GRAFT  05/05/2011   Procedure: BYPASS GRAFT AXILLA-BIFEMORAL;  Surgeon: Sherren Kerns, MD;  Location: [facility] OR;  Service: Vascular;  Laterality: Right;  . AXILLARY-FEMORAL BYPASS GRAFT  06/10/2011   Procedure: BYPASS GRAFT AXILLA-BIFEMORAL;  Surgeon: Sherren Kerns, MD;  Location: [facility] OR;  Service: Vascular;  Laterality: Right;  REVISION Right axillary bifemoral bypass using a 8mm x 80 cm Propaten Graft  . BAND  HEMORRHOIDECTOMY  1960's  . CARDIAC CATHETERIZATION     multiple-see epic  . CARDIAC VALVE REPLACEMENT  06/2003  . CATARACT EXTRACTION     left eye  . COLONOSCOPY    . CORONARY ANGIOPLASTY     right leg  . CORONARY ANGIOPLASTY WITH STENT PLACEMENT  2000   /E-chart  . CORONARY ARTERY BYPASS GRAFT  1989/06/2003   CABG X 5; CABG X1  . FEMORAL-POPLITEAL BYPASS GRAFT  05/05/2011   Procedure: BYPASS GRAFT FEMORAL-POPLITEAL ARTERY;  Surgeon: Sherren Kerns, MD;  Location: [facility] OR;  Service: Vascular;  Laterality: Right;  . I&D EXTREMITY  07/04/2011   Procedure: IRRIGATION AND DEBRIDEMENT EXTREMITY;  Surgeon: Loanne Drilling, MD;  Location: [facility] OR;  Service: Orthopedics;  Laterality: Left;  . OSTECTOMY  09/2001   right hip/E-chart  . PERIPHERAL VASCULAR CATHETERIZATION N/A 08/04/2014   Procedure: Aortic Arch Angiography;  Surgeon: Sherren Kerns, MD;  Location: [facility] INVASIVE CV LAB;  Service: Cardiovascular;  Laterality: N/A;  . PERIPHERAL VASCULAR CATHETERIZATION Right 08/04/2014   Procedure: Upper Extremity Angiography;  Surgeon: Sherren Kerns, MD;  Location: [facility] INVASIVE CV LAB;  Service: Cardiovascular;  Laterality: Right;  . TONSILLECTOMY  1960    Allergies  Allergen Reactions  . Shrimp [Shellfish Allergy] Nausea And Vomiting    "haven't eaten any since 1980's" pt only allergic to shrimp. Not shellfish    Current Outpatient Prescriptions  Medication Sig Dispense Refill  . acetaminophen-codeine (TYLENOL #3) 300-30 MG per tablet Take 1 tablet by mouth 2 (two) times daily.  2  . aspirin EC 325 MG tablet Take 325 mg by mouth at bedtime.     Marland Kitchen atorvastatin (LIPITOR) 20 MG tablet Take 1 tablet (20 mg total) by mouth daily. 90 tablet 2  . donepezil (ARICEPT) 10 MG tablet Take 10 mg by mouth at bedtime.     . hydrOXYzine (ATARAX/VISTARIL) 10 MG tablet Take 10 mg by mouth 3 (three) times daily as needed.    . meloxicam (MOBIC) 7.5 MG tablet Take 7.5 mg by mouth daily.    . metoprolol succinate  (TOPROL-XL) 25 MG 24 hr tablet Take 1 tablet (25 mg total) by mouth 2 (two) times daily.    . nitroGLYCERIN (NITROSTAT) 0.4 MG SL tablet Place 0.4 mg under the tongue every 5 (five) minutes as needed. For chest pain    . omeprazole (PRILOSEC) 20 MG capsule Take 1 capsule (20 mg total) by mouth daily. 30 capsule 0  . oxybutynin (DITROPAN-XL) 10 MG 24 hr tablet Take 10 mg by mouth daily.    Marland Kitchen PARoxetine (PAXIL) 10 MG tablet Take 1 tablet by mouth daily.    . ramipril (ALTACE) 5 MG capsule Take 5 mg by mouth daily.  1  . Tamsulosin HCl (FLOMAX) 0.4 MG CAPS Take 0.4  mg by mouth at bedtime.     . Vitamin D, Ergocalciferol, (DRISDOL) 50000 UNITS CAPS capsule Take 50,000 Units by mouth once a week. TUESDAY  5  . VOLTAREN 1 % GEL Apply 2 g topically 4 (four) times daily as needed (pain).     Marland Kitchen atorvastatin (LIPITOR) 20 MG tablet Take 1 tablet (20 mg total) by mouth daily. (Patient not taking: Reported on 11/22/2015) 30 tablet 3  . traMADol (ULTRAM) 50 MG tablet Take 1 tablet by mouth every 6 (six) hours as needed for moderate pain or severe pain.      No current facility-administered medications for this visit.     ROS: See HPI for pertinent positives and negatives.   Physical Examination  Vitals:   11/22/15 1404 11/22/15 1406  BP: (!) 202/73 (!) 197/73  Pulse: (!) 54 (!) 54  Resp: 16   Temp: 97.1 F (36.2 C)   SpO2: 99%   Weight: 139 lb (63 kg)   Height: 5\' 5"  (1.651 m)    Body mass index is 23.13 kg/m.  General: A&O x 3, WDWN, elderly male. Gait: slow, shuffling, using cane Eyes: PERRLA. Pulmonary: Respirations are non labored, CTAB, good air movement Cardiac: regular rhythm, no detected murmur.         Carotid Bruits Right Left   Negative Positive  Aorta is not palpable. Radial pulses: 2+ palpable  Right ax-fem bypass graft has a 1+ palpable pulse. The fem -fem bypass graft has a 2+ palpable pulse.                           VASCULAR EXAM: Extremities without ischemic  changes, without Gangrene; without open wounds. Left great toe is surgically absent.                                                                                                           LE Pulses Right Left       FEMORAL   palpable   palpable        POPLITEAL  not palpable   not palpable       POSTERIOR TIBIAL   palpable   not palpable        DORSALIS PEDIS      ANTERIOR TIBIAL  palpable  not palpable    Abdomen: soft, NT, no palpable masses. Skin: no rashes, no ulcers. Musculoskeletal: no muscle wasting or atrophy.  Neurologic: A&O X 2; Appropriate Affect; SENSATION: normal; MOTOR FUNCTION:  moving all extremities equally, motor strength 4/5 throughout. Speech is fluent/normal. CN 2-12 intact except her is hard of hearing.    ASSESSMENT: [name]. is a 82 y.o. male who is s/p right axillary bifemoral bypass as well as a right femoral to below-knee popliteal bypass. The axbifem was a Dacron graft. The femoropopliteal was a PTFE graft. This was in March 2013. He is also s/p Arch aortogram, right subclavian angiogram, right subclavian stent (7 x 29) balloon-expandable, ultrasound right brachial artery, to improve inflow for his  axbifem bypass.  He does not walk much, seems to be generally weak and tired according to wife's report. He also has dementia. There are no signs of ischemia in his feet/legs.   Left carotid bruit auscultated today. He has no known hx of stroke or TIA. Carotid duplex in 2014 demonstrated <50% bilateral ICA stenoses.  I advised pt's wife to call pt's PCP or cardiologist office and ask for guidance re his elevated blood pressure.   DATA Carotid duplex performed on 07/23/12, requested by Dr. Verdis Prime, demonstrates <50% bilateral ICA stenoses (review of records).  Today's axillary to femoral bypass graft duplex demonstrates the right subclavian stent as patent. The right axillo-bifemoral bypass graft appears patent, however plaque observed in  the femoral to femoral portion of the graft, increased velocity at the distal anastomosis. No significant change since the prior exam.  Right ABI has declined to 0.81 from 0.90 since 11/16/14, biphasic waveforms. Left ABI remains stable at 0.54 compared to 0.50, monophasic waveforms.   PLAN:  Based on the patient's vascular studies and examination, pt will return to clinic in 1 year with ABI's, right ax-fem bypass graft duplex, and carotid duplex.  I discussed in depth with the patient the nature of atherosclerosis, and emphasized the importance of maximal medical management including strict control of blood pressure, blood glucose, and lipid levels, obtaining regular exercise, and continued cessation of smoking.  The patient is aware that without maximal medical management the underlying atherosclerotic disease process will progress, limiting the benefit of any interventions.  The patient was given information about PAD including signs, symptoms, treatment, what symptoms should prompt the patient to seek immediate medical care, and risk reduction measures to take.  Charisse March, RN, MSN, FNP-C [facility] [place] Phone: [phone]  Clinic MD: Imogene Burn  11/22/15 2:13 PM

## 2015-11-27 NOTE — Addendum Note (Signed)
Addended by: Yolonda KidaEVANS, ASHLEIGH N on: 11/27/2015 11:39 AM   Modules accepted: Orders

## 2016-02-28 ENCOUNTER — Encounter (HOSPITAL_COMMUNITY): Payer: Self-pay

## 2016-02-28 ENCOUNTER — Emergency Department (HOSPITAL_COMMUNITY)
Admission: EM | Admit: 2016-02-28 | Discharge: 2016-02-28 | Disposition: A | Discharge status: Home / Self Care | Payer: Medicare Other | Attending: Physician Assistant | Admitting: Physician Assistant

## 2016-02-28 ENCOUNTER — Emergency Department (HOSPITAL_COMMUNITY): Payer: Medicare Other

## 2016-02-28 DIAGNOSIS — F0280 Dementia in other diseases classified elsewhere without behavioral disturbance: Secondary | ICD-10-CM | POA: Insufficient documentation

## 2016-02-28 DIAGNOSIS — W1839XA Other fall on same level, initial encounter: Secondary | ICD-10-CM | POA: Insufficient documentation

## 2016-02-28 DIAGNOSIS — Z7982 Long term (current) use of aspirin: Secondary | ICD-10-CM | POA: Insufficient documentation

## 2016-02-28 DIAGNOSIS — S20212A Contusion of left front wall of thorax, initial encounter: Secondary | ICD-10-CM | POA: Insufficient documentation

## 2016-02-28 DIAGNOSIS — G309 Alzheimer's disease, unspecified: Secondary | ICD-10-CM | POA: Insufficient documentation

## 2016-02-28 DIAGNOSIS — Y939 Activity, unspecified: Secondary | ICD-10-CM | POA: Diagnosis not present

## 2016-02-28 DIAGNOSIS — Z87891 Personal history of nicotine dependence: Secondary | ICD-10-CM | POA: Insufficient documentation

## 2016-02-28 DIAGNOSIS — Y999 Unspecified external cause status: Secondary | ICD-10-CM | POA: Diagnosis not present

## 2016-02-28 DIAGNOSIS — Z955 Presence of coronary angioplasty implant and graft: Secondary | ICD-10-CM | POA: Diagnosis not present

## 2016-02-28 DIAGNOSIS — W19XXXA Unspecified fall, initial encounter: Secondary | ICD-10-CM

## 2016-02-28 DIAGNOSIS — S299XXA Unspecified injury of thorax, initial encounter: Secondary | ICD-10-CM | POA: Diagnosis present

## 2016-02-28 DIAGNOSIS — R52 Pain, unspecified: Secondary | ICD-10-CM

## 2016-02-28 DIAGNOSIS — Y929 Unspecified place or not applicable: Secondary | ICD-10-CM | POA: Insufficient documentation

## 2016-02-28 DIAGNOSIS — E119 Type 2 diabetes mellitus without complications: Secondary | ICD-10-CM | POA: Diagnosis not present

## 2016-02-28 DIAGNOSIS — J45909 Unspecified asthma, uncomplicated: Secondary | ICD-10-CM | POA: Diagnosis not present

## 2016-02-28 DIAGNOSIS — I2510 Atherosclerotic heart disease of native coronary artery without angina pectoris: Secondary | ICD-10-CM | POA: Insufficient documentation

## 2016-02-28 NOTE — ED Triage Notes (Signed)
Pt here with his wife after a fall. He reports pain on his sides. Pt has Alzheimer's and is a poor historian. No LOC. Wife was asleep when he fell.

## 2016-02-28 NOTE — ED Provider Notes (Signed)
[facility]-EMERGENCY DEPT Provider Note   CSN: [id number] Arrival date & time: 02/28/16  1224     History   Chief Complaint Chief Complaint  Patient presents with  . Fall    HPI [name]. is a 83 y.o. male.  Patient with history of Alzheimer's dementia, diabetes, axillary-femoral bypass on right -- presents with complaint of left rib pain and left hip pain after a fall occurring early this morning. Patient awoke his wife. Apparently he had fallen onto the floor but was able to get up and sit on a couch. He was able to ambulate at baseline with his walker. Pain continued through the morning prompting emergency department visit. Wife was able to assist patient into the car and bring him to the emergency department. No shortness of breath or difficulty breathing. No apparent head injury. Mental status is at baseline. The onset of this condition was acute. The course is constant. Aggravating factors: none. Alleviating factors: none.        Past Medical History:  Diagnosis Date  . Amputation of great toe, left, traumatic (HCC)   . Arthritis   . Asthma    as a young child  . Blind right eye    "since age 12-4; S/P whooping cough"  . BPH (benign prostatic hypertrophy)    Dr.Nesi is urologist  . Bruises easily    takes Pletal and ASA daily  . CAD (coronary artery disease)   . Claudication (HCC)   . Colitis    hx of  . DEMENTIA   . Diabetes mellitus    borderline diabetic  . Gastritis   . Heart murmur   . History of shingles 3-51yrs ago  . Hx of colonic polyps   . Hyperlipidemia    takes Atorvastatin daily  . Hypertension    takes Ramipril and Metoprolol daily  . Insomnia    takes Ambien nightly  . Mental disorder    takes Aricept daily  . Palpitations    we need to exclude atrial fib and ventricular arrhythmia.  . Peripheral vascular disease (HCC)   . Urinary urgency    takes Flomax daily    Patient Active Problem List   Diagnosis Date Noted  . CAD (coronary  artery disease) of artery bypass graft 11/24/2012    Class: Chronic  . S/P aortic valve replacement with bioprosthetic valve 11/24/2012    Class: Chronic  . Amputation, toe, traumatic (HCC) 07/04/2011  . Hematoma of groin 06/19/2011  . Atherosclerosis of native arteries of the extremities with ulceration(440.23) 06/19/2011  . Other complications due to other vascular device, implant, and graft 06/19/2011  . PVD (peripheral vascular disease) with claudication (HCC) 06/05/2011    Past Surgical History:  Procedure Laterality Date  . AMPUTATION  07/04/2011   Procedure: AMPUTATION FOOT;  Surgeon: Loanne Drilling, MD;  Location: [facility] OR;  Service: Orthopedics;  Laterality: Left;  . AORTIC VALVE REPLACEMENT  06/2003   /E-chart  . AXILLARY-FEMORAL BYPASS GRAFT  05/05/2011   Procedure: BYPASS GRAFT AXILLA-BIFEMORAL;  Surgeon: Sherren Kerns, MD;  Location: [facility] OR;  Service: Vascular;  Laterality: Right;  . AXILLARY-FEMORAL BYPASS GRAFT  06/10/2011   Procedure: BYPASS GRAFT AXILLA-BIFEMORAL;  Surgeon: Sherren Kerns, MD;  Location: [facility] OR;  Service: Vascular;  Laterality: Right;  REVISION Right axillary bifemoral bypass using a 8mm x 80 cm Propaten Graft  . BAND HEMORRHOIDECTOMY  1960's  . CARDIAC CATHETERIZATION     multiple-see epic  . CARDIAC VALVE REPLACEMENT  06/2003  . CATARACT EXTRACTION     left eye  . COLONOSCOPY    . CORONARY ANGIOPLASTY     right leg  . CORONARY ANGIOPLASTY WITH STENT PLACEMENT  2000   /E-chart  . CORONARY ARTERY BYPASS GRAFT  1989/06/2003   CABG X 5; CABG X1  . FEMORAL-POPLITEAL BYPASS GRAFT  05/05/2011   Procedure: BYPASS GRAFT FEMORAL-POPLITEAL ARTERY;  Surgeon: Sherren Kernsharles E Fields, MD;  [facility];  Service: Vascular;  Laterality: Right;  . I&D EXTREMITY  07/04/2011   Procedure: IRRIGATION AND DEBRIDEMENT EXTREMITY;  Surgeon: Loanne DrillingFrank V Aluisio, MD;  Location: [facility] OR;  Service: Orthopedics;  Laterality: Left;  . OSTECTOMY  09/2001   right hip/E-chart  . PERIPHERAL  VASCULAR CATHETERIZATION N/A 08/04/2014   Procedure: Aortic Arch Angiography;  Surgeon: Sherren Kernsharles E Fields, MD;  [facility];  Service: Cardiovascular;  Laterality: N/A;  . PERIPHERAL VASCULAR CATHETERIZATION Right 08/04/2014   Procedure: Upper Extremity Angiography;  Surgeon: Sherren Kernsharles E Fields, MD;  [facility];  Service: Cardiovascular;  Laterality: Right;  . TONSILLECTOMY  1960       Home Medications    Prior to Admission medications   Medication Sig Start Date End Date Taking? Authorizing Provider  acetaminophen-codeine (TYLENOL #3) 300-30 MG per tablet Take 1 tablet by mouth 2 (two) times daily. 10/17/14   Historical Provider, MD  aspirin EC 325 MG tablet Take 325 mg by mouth at bedtime.     Historical Provider, MD  atorvastatin (LIPITOR) 20 MG tablet Take 1 tablet (20 mg total) by mouth daily. Patient not taking: Reported on 11/22/2015 03/02/15   Lyn RecordsHenry W Smith, MD  atorvastatin (LIPITOR) 20 MG tablet Take 1 tablet (20 mg total) by mouth daily. 10/11/15   Lyn RecordsHenry W Smith, MD  donepezil (ARICEPT) 10 MG tablet Take 10 mg by mouth at bedtime.     Historical Provider, MD  hydrOXYzine (ATARAX/VISTARIL) 10 MG tablet Take 10 mg by mouth 3 (three) times daily as needed.    Historical Provider, MD  meloxicam (MOBIC) 7.5 MG tablet Take 7.5 mg by mouth daily.    Historical Provider, MD  metoprolol succinate (TOPROL-XL) 25 MG 24 hr tablet Take 1 tablet (25 mg total) by mouth 2 (two) times daily. 06/20/14   Lyn RecordsHenry W Smith, MD  nitroGLYCERIN (NITROSTAT) 0.4 MG SL tablet Place 0.4 mg under the tongue every 5 (five) minutes as needed. For chest pain    Historical Provider, MD  omeprazole (PRILOSEC) 20 MG capsule Take 1 capsule (20 mg total) by mouth daily. 09/16/14   Derwood KaplanAnkit Nanavati, MD  oxybutynin (DITROPAN-XL) 10 MG 24 hr tablet Take 10 mg by mouth daily. 07/03/11   Historical Provider, MD  PARoxetine (PAXIL) 10 MG tablet Take 1 tablet by mouth daily. 08/02/13   Historical Provider, MD   ramipril (ALTACE) 5 MG capsule Take 5 mg by mouth daily. 10/16/14   Historical Provider, MD  Tamsulosin HCl (FLOMAX) 0.4 MG CAPS Take 0.4 mg by mouth at bedtime.     Historical Provider, MD  traMADol (ULTRAM) 50 MG tablet Take 1 tablet by mouth every 6 (six) hours as needed for moderate pain or severe pain.  05/10/13   Historical Provider, MD  Vitamin D, Ergocalciferol, (DRISDOL) 50000 UNITS CAPS capsule Take 50,000 Units by mouth once a week. TUESDAY 06/12/14   Historical Provider, MD  VOLTAREN 1 % GEL Apply 2 g topically 4 (four) times daily as needed (pain).  02/08/13   Historical Provider,  MD    Family History Family History  Problem Relation Age of Onset  . Heart disease Father   . Cancer Mother   . Anesthesia problems Neg Hx   . Hypotension Neg Hx   . Malignant hyperthermia Neg Hx   . Pseudochol deficiency Neg Hx     Social History Social History  Substance Use Topics  . Smoking status: Former Smoker    Types: Cigarettes    Quit date: 02/25/1940  . Smokeless tobacco: Never Used  . Alcohol use No     Comment: "last alcohol was ~ 1950's"     Allergies   Shrimp [shellfish allergy]   Review of Systems Review of Systems  Constitutional: Negative for fever.  HENT: Negative for rhinorrhea and sore throat.   Eyes: Negative for redness.  Respiratory: Negative for cough.   Cardiovascular: Positive for chest pain.  Gastrointestinal: Negative for abdominal pain, diarrhea, nausea and vomiting.  Genitourinary: Negative for dysuria.  Musculoskeletal: Positive for arthralgias. Negative for myalgias.  Skin: Negative for rash.  Neurological: Negative for headaches.     Physical Exam Updated Vital Signs BP 152/73   Pulse (!) 59   Temp 98.1 F (36.7 C) (Oral)   Resp 15   SpO2 99%   Physical Exam  Constitutional: He appears well-developed and well-nourished.  HENT:  Head: Normocephalic and atraumatic.  Mouth/Throat: Oropharynx is clear and moist.  Eyes: Conjunctivae are  normal. Right eye exhibits no discharge. Left eye exhibits no discharge.  Neck: Normal range of motion. Neck supple.  Cardiovascular: Normal rate, regular rhythm and normal heart sounds.   No murmur heard. Pulmonary/Chest: Effort normal and breath sounds normal. No respiratory distress. He has no wheezes. He has no rales. He exhibits tenderness.  Abdominal: Soft. He exhibits no mass. There is no tenderness. There is no guarding.  Bypass graft palpable R abd  Musculoskeletal:       Right shoulder: Normal.       Right hip: Normal.       Left hip: Normal.       Right knee: Normal.       Left knee: Normal.       Right ankle: Normal.       Left ankle: Normal.       Lumbar back: Normal.  Neurological: He is alert.  Skin: Skin is warm and dry.  Psychiatric: He has a normal mood and affect.  Nursing note and vitals reviewed.    ED Treatments / Results  Labs (all labs ordered are listed, but only abnormal results are displayed) Labs Reviewed - No data to display  ED ECG REPORT   Date: 02/28/2016  Rate: 53  Rhythm: sinus bradycardia  QRS Axis: right  Intervals: normal  ST/T Wave abnormalities: nonspecific T wave changes   Conduction Disutrbances:right bundle branch block  Narrative Interpretation:   Old EKG Reviewed: changes noted, ectopy resolved, slower   I have personally reviewed the EKG tracing and agree with the computerized printout as noted.  Radiology Dg Ribs Unilateral W/chest Left  Result Date: 02/28/2016 CLINICAL DATA:  Fall EXAM: LEFT RIBS AND CHEST - 3+ VIEW COMPARISON:  09/16/2014 FINDINGS: Prior CABG and valve replacement. Cardiomegaly. Calcifications in the aorta. No focal airspace opacities, effusions or edema. No acute bony abnormality. IMPRESSION: Cardiomegaly.  No active disease. Electronically Signed   By: Charlett Nose M.D.   On: 02/28/2016 19:23   Dg Hips Bilat With Pelvis Min 5 Views  Result Date: 02/28/2016 CLINICAL DATA:  Fall. EXAM: DG HIP (WITH OR  WITHOUT PELVIS) 5+V BILAT COMPARISON:  No recent prior . FINDINGS: Degenerative changes lumbar spine and both hips. No acute bony or joint abnormality identified. No evidence of fracture or dislocation. Aortoiliac and peripheral atherosclerotic vascular calcification. Surgical clips left groin. IMPRESSION: 1. Diffuse osteopenia degenerative change. No acute abnormality. No evidence of fracture or dislocation. 2. Aortoiliac and peripheral atherosclerotic vascular disease. Electronically Signed   By: Maisie Fus  Register   On: 02/28/2016 13:26    Procedures Procedures (including critical care time)  Medications Ordered in ED Medications - No data to display   Initial Impression / Assessment and Plan / ED Course  I have reviewed the triage vital signs and the nursing notes.  Pertinent labs & imaging results that were available during my care of the patient were reviewed by me and considered in my medical decision making (see chart for details).  Clinical Course    Patient seen and examined. Pt d/w and seen by Dr. Corlis Leak.    Vital signs reviewed and are as follows: BP 152/73   Pulse (!) 59   Temp 98.1 F (36.7 C) (Oral)   Resp 15   SpO2 99%   No fractures. EKG without any change. Patient ambulatory at baseline. Will discharged home. Encourage PCP follow-up as needed.   Final Clinical Impressions(s) / ED Diagnoses   Final diagnoses:  Fall  Contusion of rib on left side, initial encounter  Fall, initial encounter   Fall, rib contusion: Patient comfortable, at baseline. Unclear etiology of fall. EKG without significant change. His baseline appears well. Wife is his caretaker. D/c home at this time.   New Prescriptions New Prescriptions   No medications on file     Renne Crigler, PA-C 02/28/16 2023    Courteney Lyn Mackuen, MD 03/06/16 1148

## 2016-02-28 NOTE — ED Notes (Signed)
Pt verbalized understanding of d/c instructions and has no further questions. Pt is stable, A&Ox4, VSS. Pt unable to sign b/c signature was not working. Pt understands upcoming care and when to come back. No further questions

## 2016-02-28 NOTE — Discharge Instructions (Signed)
Please read and follow all provided instructions.  Your diagnoses today include:  1. Contusion of rib on left side, initial encounter   2. Pain   3. Fall   4. Fall, initial encounter     Tests performed today include:  Vital signs. See below for your results today.   X-ray of your hip and ribs - no broken bones seen  Medications prescribed:   None  Home care instructions:  Follow any educational materials contained in this packet.  Follow-up instructions: Please follow-up with your primary care provider as needed for further evaluation of your symptoms.  Return instructions:   Please return to the Emergency Department if you experience worsening symptoms.   Please return if you have any other emergent concerns.  Additional Information:  Your vital signs today were: BP 146/73    Pulse (!) 58    Temp 98.1 F (36.7 C) (Oral)    Resp 15    SpO2 99%  If your blood pressure (BP) was elevated above 135/85 this visit, please have this repeated by your doctor within one month. ---------------

## 2016-03-07 ENCOUNTER — Telehealth: Payer: Self-pay | Admitting: Diagnostic Neuroimaging

## 2016-03-11 NOTE — Telephone Encounter (Signed)
LVM advising patient's wife, Wynona CanesChristine that patient was last seen by Dr Marjory LiesPenumalli July 2015. Therefore Dr Marjory LiesPenumalli would need to see him again in follow up in order to give her accurate information regarding his health and his condition. Advised her to call his PCP or other providers that have been following him regularly for their assistance. Advised her that Dr Marjory LiesPenumalli will be glad to see him again in follow up if she would like to call back and schedule appointment. Gave her office number.

## 2016-03-12 ENCOUNTER — Ambulatory Visit: Payer: Medicare Other | Admitting: Podiatry

## 2016-11-27 ENCOUNTER — Ambulatory Visit (INDEPENDENT_AMBULATORY_CARE_PROVIDER_SITE_OTHER)
Admission: RE | Admit: 2016-11-27 | Discharge: 2016-11-27 | Disposition: A | Discharge status: Home / Self Care | Payer: Medicare Other | Source: Ambulatory Visit | Attending: Vascular Surgery | Admitting: Vascular Surgery

## 2016-11-27 ENCOUNTER — Ambulatory Visit (INDEPENDENT_AMBULATORY_CARE_PROVIDER_SITE_OTHER): Admitting: Family

## 2016-11-27 ENCOUNTER — Encounter: Payer: Self-pay | Admitting: Family

## 2016-11-27 ENCOUNTER — Ambulatory Visit (HOSPITAL_COMMUNITY)
Admission: RE | Admit: 2016-11-27 | Discharge: 2016-11-27 | Disposition: A | Discharge status: Home / Self Care | Payer: Medicare Other | Source: Ambulatory Visit | Attending: Vascular Surgery | Admitting: Vascular Surgery

## 2016-11-27 VITALS — BP 100/71 | HR 65 | Resp 16

## 2016-11-27 DIAGNOSIS — R0989 Other specified symptoms and signs involving the circulatory and respiratory systems: Secondary | ICD-10-CM | POA: Insufficient documentation

## 2016-11-27 DIAGNOSIS — I6523 Occlusion and stenosis of bilateral carotid arteries: Secondary | ICD-10-CM | POA: Insufficient documentation

## 2016-11-27 DIAGNOSIS — R936 Abnormal findings on diagnostic imaging of limbs: Secondary | ICD-10-CM | POA: Insufficient documentation

## 2016-11-27 DIAGNOSIS — I779 Disorder of arteries and arterioles, unspecified: Secondary | ICD-10-CM

## 2016-11-27 DIAGNOSIS — I10 Essential (primary) hypertension: Secondary | ICD-10-CM | POA: Insufficient documentation

## 2016-11-27 LAB — VAS US CAROTID
LCCADDIAS: 0 cm/s
LCCAPDIAS: 15 cm/s
LCCAPSYS: 122 cm/s
LEFT ECA DIAS: 0 cm/s
LEFT VERTEBRAL DIAS: 11 cm/s
LICADDIAS: -9 cm/s
LICAPSYS: -56 cm/s
Left CCA dist sys: 74 cm/s
Left ICA dist sys: -52 cm/s
Left ICA prox dias: -8 cm/s
RCCAPSYS: 93 cm/s
RIGHT CCA MID DIAS: 0 cm/s
RIGHT ECA DIAS: 0 cm/s
RIGHT VERTEBRAL DIAS: 0 cm/s
Right CCA prox dias: 9 cm/s
Right cca dist sys: -46 cm/s

## 2016-11-27 NOTE — Progress Notes (Signed)
[facility] [place]   CC: Follow up peripheral artery occlusive disease  History of Present Illness [name] [name]. is a 83 y.o. male patient of Dr. Darrick Penna who presents for follow up evaluation of peripheral artery occlusive disease. The patient previously had undergone a right axillary bifemoral bypass as well as a right femoral to below-knee popliteal bypass. The axbifem was a Dacron graft. The femoropopliteal was a PTFE graft. This was in March 2013. He is also s/p Arch aortogram, right subclavian angiogram, right subclavian stent (7 x 29) balloon-expandable, ultrasound right brachial artery, to improve inflow for his axbifem bypass.   He has no evidence of nonhealing wounds. He has developed some progressive dementia. He has mainly sundowning type symptoms. He is able to complete his daily activities as well as work in his garden. He has significant short-term memory loss. Other medical problems include hyperlipidemia, coronary artery disease, benign prostatic hypertrophy, and hypertension.  He was last evaluated by Dr. Darrick Penna and Judie Petit. Collins PA-C on 11/16/14. At that time he had a recent right subclavian stent to improve inflow for his axbifem bypass. Overall he was doing well although he did have some dementia. He is minimally ambulatory and has some weakness in his legs but it is difficult to determine whether or not this is actually claudication. He had reasonably perfused limbs bilaterally. He was to follow-up with repeat ABIs and duplex in 1 year. He returns today for this.   He does not walk at all now.  Wife denies any known hx of a stroke for her husband.   Wife states pt is in [facility] care since February of 2017, wife states he has worsening dementia.   Pt Diabetic: No Pt smoker: non-smoker  Pt meds include: Statin :Yes Betablocker: Yes ASA: Yes Other anticoagulants/antiplatelets: no    Past Medical History:  Diagnosis Date  . Amputation  of great toe, left, traumatic (HCC)   . Arthritis   . Asthma    as a young child  . Blind right eye    "since age 62-4; S/P whooping cough"  . BPH (benign prostatic hypertrophy)    Dr.Nesi is urologist  . Bruises easily    takes Pletal and ASA daily  . CAD (coronary artery disease)   . Claudication (HCC)   . Colitis    hx of  . DEMENTIA   . Diabetes mellitus    borderline diabetic  . Gastritis   . Heart murmur   . History of shingles 3-45yrs ago  . Hx of colonic polyps   . Hyperlipidemia    takes Atorvastatin daily  . Hypertension    takes Ramipril and Metoprolol daily  . Insomnia    takes Ambien nightly  . Mental disorder    takes Aricept daily  . Palpitations    we need to exclude atrial fib and ventricular arrhythmia.  . Peripheral vascular disease (HCC)   . Urinary urgency    takes Flomax daily    Social History Social History  Substance Use Topics  . Smoking status: Former Smoker    Types: Cigarettes    Quit date: 02/25/1940  . Smokeless tobacco: Never Used  . Alcohol use No     Comment: "last alcohol was ~ 1950's"    Family History Family History  Problem Relation Age of Onset  . Heart disease Father   . Cancer Mother   . Anesthesia problems Neg Hx   . Hypotension Neg Hx   .  Malignant hyperthermia Neg Hx   . Pseudochol deficiency Neg Hx     Past Surgical History:  Procedure Laterality Date  . AMPUTATION  07/04/2011   Procedure: AMPUTATION FOOT;  Surgeon: Loanne Drilling, MD;  Location: [facility] OR;  Service: Orthopedics;  Laterality: Left;  . AORTIC VALVE REPLACEMENT  06/2003   /E-chart  . AXILLARY-FEMORAL BYPASS GRAFT  05/05/2011   Procedure: BYPASS GRAFT AXILLA-BIFEMORAL;  Surgeon: Sherren Kerns, MD;  Location: [facility] OR;  Service: Vascular;  Laterality: Right;  . AXILLARY-FEMORAL BYPASS GRAFT  06/10/2011   Procedure: BYPASS GRAFT AXILLA-BIFEMORAL;  Surgeon: Sherren Kerns, MD;  Location: [facility] OR;  Service: Vascular;  Laterality: Right;  REVISION Right  axillary bifemoral bypass using a 8mm x 80 cm Propaten Graft  . BAND HEMORRHOIDECTOMY  1960's  . CARDIAC CATHETERIZATION     multiple-see epic  . CARDIAC VALVE REPLACEMENT  06/2003  . CATARACT EXTRACTION     left eye  . COLONOSCOPY    . CORONARY ANGIOPLASTY     right leg  . CORONARY ANGIOPLASTY WITH STENT PLACEMENT  2000   /E-chart  . CORONARY ARTERY BYPASS GRAFT  1989/06/2003   CABG X 5; CABG X1  . FEMORAL-POPLITEAL BYPASS GRAFT  05/05/2011   Procedure: BYPASS GRAFT FEMORAL-POPLITEAL ARTERY;  Surgeon: Sherren Kerns, MD;  Location: [facility] OR;  Service: Vascular;  Laterality: Right;  . I&D EXTREMITY  07/04/2011   Procedure: IRRIGATION AND DEBRIDEMENT EXTREMITY;  Surgeon: Loanne Drilling, MD;  Location: [facility] OR;  Service: Orthopedics;  Laterality: Left;  . OSTECTOMY  09/2001   right hip/E-chart  . PERIPHERAL VASCULAR CATHETERIZATION N/A 08/04/2014   Procedure: Aortic Arch Angiography;  Surgeon: Sherren Kerns, MD;  Location[facility] INVASIVE CV LAB;  Service: Cardiovascular;  Laterality: N/A;  . PERIPHERAL VASCULAR CATHETERIZATION Right 08/04/2014   Procedure: Upper Extremity Angiography;  Surgeon: Sherren Kerns, MD;  Location: [facility] INVASIVE CV LAB;  Service: Cardiovascular;  Laterality: Right;  . TONSILLECTOMY  1960    Allergies  Allergen Reactions  . Shrimp [Shellfish Allergy] Nausea And Vomiting    "haven't eaten any since 1980's" pt only allergic to shrimp. Not shellfish    Current Outpatient Prescriptions  Medication Sig Dispense Refill  . acetaminophen-codeine (TYLENOL #3) 300-30 MG per tablet Take 1 tablet by mouth 2 (two) times daily.  2  . aspirin EC 325 MG tablet Take 325 mg by mouth at bedtime.     . meloxicam (MOBIC) 7.5 MG tablet Take 7.5 mg by mouth daily.    . metoprolol succinate (TOPROL-XL) 25 MG 24 hr tablet Take 1 tablet (25 mg total) by mouth 2 (two) times daily.    . nitroGLYCERIN (NITROSTAT) 0.4 MG SL tablet Place 0.4 mg under the tongue every 5 (five) minutes as  needed. For chest pain    . oxybutynin (DITROPAN-XL) 10 MG 24 hr tablet Take 10 mg by mouth daily.    . ramipril (ALTACE) 5 MG capsule Take 5 mg by mouth daily.  1  . sertraline (ZOLOFT) 25 MG tablet TK 1 T PO QD  1  . Tamsulosin HCl (FLOMAX) 0.4 MG CAPS Take 0.4 mg by mouth at bedtime.     . traMADol (ULTRAM) 50 MG tablet Take 1 tablet by mouth every 6 (six) hours as needed for moderate pain or severe pain.     . Vitamin D, Ergocalciferol, (DRISDOL) 50000 UNITS CAPS capsule Take 50,000 Units by mouth once a week. TUESDAY  5  . VOLTAREN 1 %  GEL Apply 2 g topically 4 (four) times daily as needed (pain).      No current facility-administered medications for this visit.     ROS: See HPI for pertinent positives and negatives.   Physical Examination  Vitals:   11/27/16 1522  BP: 100/71  Pulse: 65  Resp: 16  SpO2: 98%   There is no height or weight on file to calculate BMI.  General: A&O x 3, WDWN, elderly male. Gait: seated in w/c Eyes: PERRLA. Pulmonary: Respirations are non labored, CTAB, good air movement Cardiac: regular rhythm, no detected murmur.         Carotid Bruits Right Left   Negative Positive   Abdominal aortic pulse is not palpable. Radial pulses: 1+ palpable  Right ax-fem bypass graft has a 1+ palpable pulse. The fem -fem bypass graft has a 2+ palpable pulse.                           VASCULAR EXAM: Extremities without ischemic changes, without Gangrene; without open wounds. Left great toe is surgically absent.                                                                                                                                                        LE Pulses Right Left       FEMORAL   palpable   palpable        POPLITEAL  not palpable   not palpable       POSTERIOR TIBIAL   palpable   not palpable        DORSALIS PEDIS      ANTERIOR TIBIAL  not palpable  not palpable    Abdomen: soft, NT, no palpable masses. Skin: no rashes, no  ulcers. Musculoskeletal: no muscle wasting or atrophy.         Neurologic: Alert, not able to articulate, speech is unintelligible; appropriate affect; sensation seems normal to touch; MOTOR FUNCTION:  moving all extremities equally, motor strength 3/5 throughout.  Unable to assess CN 2-12 since pt is unable to follow commands.     ASSESSMENT: [name]. is a 83 y.o. male who is s/p right axillary bifemoral bypass as well as a right femoral to below-knee popliteal bypass. The axbifem was a Dacron graft. The femoropopliteal was a PTFE graft. This was in March 2013. He is also s/p Arch aortogram, right subclavian angiogram, right subclavian stent (7 x 29) balloon-expandable, ultrasound right brachial artery, to improve inflow for his axbifem bypass.  He does not walk much, seems to be generally weak and tired according to wife's report. He also has dementia. There are no signs of ischemia in his feet/legs.   Left carotid bruit auscultated. He has no known hx of stroke or TIA.  Carotid duplex in 2014 demonstrated <50% bilateral ICA stenoses.  He is in home Hospice Care for worsening dementia. He is not able to converse, his speech is unintelligible, not able to walk. He is eating.  I had a discussion with pt's wife re the utility of continuing surveillance of his peripheral vascular systems, when he is not a candidate for any interventions.  He has 24/7 caregiver or sitter.  Wife states pt told her in 2010 that he did not want to "be put to sleep again, or be cut on".  Wife states that there is no need to continue checking his arteries since he will not have any procedures done.    DATA  Carotid duplex (11/27/16): 1-39% bilateral ICA stenosis. Bilateral vertebral artery flow is antegrade.  Bilateral subclavian artery waveforms are normal.  Carotid duplex on 07/23/12, requested by Dr. Verdis Prime, demonstrates <50% bilateral ICA stenoses (review of records), no change.   Axillary to  femoral bypass graft duplex (11/27/16): Patent right axillary to femoral bypass graft. Patent right to left fem-fem bypass graft. Elevated velocities at the distal anastomosis suggest 50-70% stenosis.   No significant change since the prior exam on 11-22-15.    ABI (Date: 11/27/2016):  R:   ABI: 1.02 (was 0.81 on 11-22-15),   PT: tri  DP: bi  TBI:  0.67 (was 0.64)  L:   ABI: 0.61 (was 0.54),   PT: mono  DP: mono  TBI: s/p great toe amputation  Improved bilateral ABI: Right is normal with tri and biphasic waveforms; left has moderate disease with monophasic waveforms.    PLAN:  Based on the patient's vascular studies, examination, and HPI, pt will return to clinic as needed.   The patient's wife was given information about PAD including signs, symptoms, treatment, what symptoms should prompt the patient to seek immediate medical care, and risk reduction measures to take.  Charisse March, RN, MSN, FNP-C [facility] [place] Phone: [phone]  Clinic MD: Imogene Burn on call  11/27/16 4:17 PM

## 2016-11-27 NOTE — Patient Instructions (Signed)
Peripheral Vascular Disease Peripheral vascular disease (PVD) is a disease of the blood vessels that are not part of your heart and brain. A simple term for PVD is poor circulation. In most cases, PVD narrows the blood vessels that carry blood from your heart to the rest of your body. This can result in a decreased supply of blood to your arms, legs, and internal organs, like your stomach or kidneys. However, it most often affects a person's lower legs and feet. There are two types of PVD.  Organic PVD. This is the more common type. It is caused by damage to the structure of blood vessels.  Functional PVD. This is caused by conditions that make blood vessels contract and tighten (spasm). Without treatment, PVD tends to get worse over time. PVD can also lead to acute ischemic limb. This is when an arm or limb suddenly has trouble getting enough blood. This is a medical emergency. Follow these instructions at home:  Take medicines only as told by your doctor.  Do not use any tobacco products, including cigarettes, chewing tobacco, or electronic cigarettes. If you need help quitting, ask your doctor.  Lose weight if you are overweight, and maintain a healthy weight as told by your doctor.  Eat a diet that is low in fat and cholesterol. If you need help, ask your doctor.  Exercise regularly. Ask your doctor for some good activities for you.  Take good care of your feet.  Wear comfortable shoes that fit well.  Check your feet often for any cuts or sores. Contact a doctor if:  You have cramps in your legs while walking.  You have leg pain when you are at rest.  You have coldness in a leg or foot.  Your skin changes.  You are unable to get or have an erection (erectile dysfunction).  You have cuts or sores on your feet that are not healing. Get help right away if:  Your arm or leg turns cold and blue.  Your arms or legs become red, warm, swollen, painful, or numb.  You have  chest pain or trouble breathing.  You suddenly have weakness in your face, arm, or leg.  You become very confused or you cannot speak.  You suddenly have a very bad headache.  You suddenly cannot see. This information is not intended to replace advice given to you by your health care provider. Make sure you discuss any questions you have with your health care provider. Document Released: 05/07/2009 Document Revised: 07/19/2015 Document Reviewed: 07/21/2013 Elsevier Interactive Patient Education  2017 Elsevier Inc.    Stroke Prevention Some medical conditions and behaviors are associated with an increased chance of having a stroke. You may prevent a stroke by making healthy choices and managing medical conditions. How can I reduce my risk of having a stroke?  Stay physically active. Get at least 30 minutes of activity on most or all days.  Do not smoke. It may also be helpful to avoid exposure to secondhand smoke.  Limit alcohol use. Moderate alcohol use is considered to be:  No more than 2 drinks per day for men.  No more than 1 drink per day for nonpregnant women.  Eat healthy foods. This involves:  Eating 5 or more servings of fruits and vegetables a day.  Making dietary changes that address high blood pressure (hypertension), high cholesterol, diabetes, or obesity.  Manage your cholesterol levels.  Making food choices that are high in fiber and low in saturated fat,   trans fat, and cholesterol may control cholesterol levels.  Take any prescribed medicines to control cholesterol as directed by your health care provider.  Manage your diabetes.  Controlling your carbohydrate and sugar intake is recommended to manage diabetes.  Take any prescribed medicines to control diabetes as directed by your health care provider.  Control your hypertension.  Making food choices that are low in salt (sodium), saturated fat, trans fat, and cholesterol is recommended to manage  hypertension.  Ask your health care provider if you need treatment to lower your blood pressure. Take any prescribed medicines to control hypertension as directed by your health care provider.  If you are 18-39 years of age, have your blood pressure checked every 3-5 years. If you are 40 years of age or older, have your blood pressure checked every year.  Maintain a healthy weight.  Reducing calorie intake and making food choices that are low in sodium, saturated fat, trans fat, and cholesterol are recommended to manage weight.  Stop drug abuse.  Avoid taking birth control pills.  Talk to your health care provider about the risks of taking birth control pills if you are over 35 years old, smoke, get migraines, or have ever had a blood clot.  Get evaluated for sleep disorders (sleep apnea).  Talk to your health care provider about getting a sleep evaluation if you snore a lot or have excessive sleepiness.  Take medicines only as directed by your health care provider.  For some people, aspirin or blood thinners (anticoagulants) are helpful in reducing the risk of forming abnormal blood clots that can lead to stroke. If you have the irregular heart rhythm of atrial fibrillation, you should be on a blood thinner unless there is a good reason you cannot take them.  Understand all your medicine instructions.  Make sure that other conditions (such as anemia or atherosclerosis) are addressed. Get help right away if:  You have sudden weakness or numbness of the face, arm, or leg, especially on one side of the body.  Your face or eyelid droops to one side.  You have sudden confusion.  You have trouble speaking (aphasia) or understanding.  You have sudden trouble seeing in one or both eyes.  You have sudden trouble walking.  You have dizziness.  You have a loss of balance or coordination.  You have a sudden, severe headache with no known cause.  You have new chest pain or an  irregular heartbeat. Any of these symptoms may represent a serious problem that is an emergency. Do not wait to see if the symptoms will go away. Get medical help at once. Call your local emergency services (911 in [place] Do not drive yourself to the hospital. This information is not intended to replace advice given to you by your health care provider. Make sure you discuss any questions you have with your health care provider. Document Released: 03/20/2004 Document Revised: 07/19/2015 Document Reviewed: 08/13/2012 Elsevier Interactive Patient Education  2017 Elsevier Inc.   

## 2017-03-30 ENCOUNTER — Other Ambulatory Visit: Payer: Self-pay

## 2017-03-30 ENCOUNTER — Emergency Department (HOSPITAL_COMMUNITY): Payer: Medicare Other

## 2017-03-30 ENCOUNTER — Encounter (HOSPITAL_COMMUNITY): Payer: Self-pay | Admitting: Emergency Medicine

## 2017-03-30 DIAGNOSIS — R5383 Other fatigue: Secondary | ICD-10-CM | POA: Diagnosis not present

## 2017-03-30 DIAGNOSIS — E119 Type 2 diabetes mellitus without complications: Secondary | ICD-10-CM | POA: Diagnosis not present

## 2017-03-30 DIAGNOSIS — J45909 Unspecified asthma, uncomplicated: Secondary | ICD-10-CM | POA: Insufficient documentation

## 2017-03-30 DIAGNOSIS — I2510 Atherosclerotic heart disease of native coronary artery without angina pectoris: Secondary | ICD-10-CM | POA: Diagnosis not present

## 2017-03-30 DIAGNOSIS — Z7982 Long term (current) use of aspirin: Secondary | ICD-10-CM | POA: Diagnosis not present

## 2017-03-30 DIAGNOSIS — R509 Fever, unspecified: Secondary | ICD-10-CM | POA: Diagnosis not present

## 2017-03-30 DIAGNOSIS — I10 Essential (primary) hypertension: Secondary | ICD-10-CM | POA: Insufficient documentation

## 2017-03-30 DIAGNOSIS — Z87891 Personal history of nicotine dependence: Secondary | ICD-10-CM | POA: Insufficient documentation

## 2017-03-30 DIAGNOSIS — Z79899 Other long term (current) drug therapy: Secondary | ICD-10-CM | POA: Diagnosis not present

## 2017-03-30 DIAGNOSIS — R4182 Altered mental status, unspecified: Secondary | ICD-10-CM | POA: Diagnosis not present

## 2017-03-30 DIAGNOSIS — F0390 Unspecified dementia without behavioral disturbance: Secondary | ICD-10-CM | POA: Diagnosis not present

## 2017-03-30 LAB — BASIC METABOLIC PANEL
Anion gap: 13 (ref 5–15)
BUN: 26 mg/dL — AB (ref 6–20)
CALCIUM: 9.4 mg/dL (ref 8.9–10.3)
CO2: 25 mmol/L (ref 22–32)
Chloride: 102 mmol/L (ref 101–111)
Creatinine, Ser: 1.66 mg/dL — ABNORMAL HIGH (ref 0.61–1.24)
GFR calc Af Amer: 40 mL/min — ABNORMAL LOW (ref >60)
GFR, EST NON AFRICAN AMERICAN: 35 mL/min — AB (ref >60)
GLUCOSE: 148 mg/dL — AB (ref 65–99)
Potassium: 4.1 mmol/L (ref 3.5–5.1)
SODIUM: 140 mmol/L (ref 135–145)

## 2017-03-30 NOTE — ED Triage Notes (Signed)
Pt was on the day care today and his BP was high 193/85 per family pt is more sleepy than usual since yesterday LSW last Friday. No fever or chills.

## 2017-03-31 ENCOUNTER — Emergency Department (HOSPITAL_COMMUNITY): Payer: Medicare Other

## 2017-03-31 ENCOUNTER — Emergency Department (HOSPITAL_COMMUNITY)
Admission: EM | Admit: 2017-03-31 | Discharge: 2017-03-31 | Disposition: A | Discharge status: Home / Self Care | Payer: Medicare Other | Attending: Emergency Medicine | Admitting: Emergency Medicine

## 2017-03-31 DIAGNOSIS — I10 Essential (primary) hypertension: Secondary | ICD-10-CM | POA: Diagnosis not present

## 2017-03-31 DIAGNOSIS — R509 Fever, unspecified: Secondary | ICD-10-CM

## 2017-03-31 LAB — CBC WITH DIFFERENTIAL/PLATELET
Basophils Absolute: 0 10*3/uL (ref 0.0–0.1)
Basophils Relative: 0 %
EOS ABS: 0 10*3/uL (ref 0.0–0.7)
EOS PCT: 0 %
HCT: 41.9 % (ref 39.0–52.0)
Hemoglobin: 13.7 g/dL (ref 13.0–17.0)
LYMPHS ABS: 0.7 10*3/uL (ref 0.7–4.0)
LYMPHS PCT: 9 %
MCH: 29.3 pg (ref 26.0–34.0)
MCHC: 32.7 g/dL (ref 30.0–36.0)
MCV: 89.5 fL (ref 78.0–100.0)
MONO ABS: 1.3 10*3/uL — AB (ref 0.1–1.0)
Monocytes Relative: 16 %
Neutro Abs: 6.1 10*3/uL (ref 1.7–7.7)
Neutrophils Relative %: 75 %
PLATELETS: 97 10*3/uL — AB (ref 150–400)
RBC: 4.68 MIL/uL (ref 4.22–5.81)
RDW: 14.4 % (ref 11.5–15.5)
WBC: 8 10*3/uL (ref 4.0–10.5)

## 2017-03-31 LAB — URINALYSIS, ROUTINE W REFLEX MICROSCOPIC
Bilirubin Urine: NEGATIVE
GLUCOSE, UA: NEGATIVE mg/dL
Ketones, ur: NEGATIVE mg/dL
Leukocytes, UA: NEGATIVE
NITRITE: NEGATIVE
PH: 5 (ref 5.0–8.0)
Protein, ur: 30 mg/dL — AB
SPECIFIC GRAVITY, URINE: 1.018 (ref 1.005–1.030)

## 2017-03-31 NOTE — ED Provider Notes (Signed)
TIME SEEN: 6:04 AM  CHIEF COMPLAINT: "He has been sleeping more than normal"  HPI: Patient is a 84 year old male with history of coronary artery disease, chronic kidney disease, hyperlipidemia, hypertension, diabetes, dementia who presents to the emergency department with his family.  They report that he has been sleeping more than normal over the past 2 days.  They report a fever of 100.4 at home.  States that his blood pressure was elevated in the 190s/90s at his adult daycare today.  They were told that this was "the stroke range" by the nursing staff.  Blood pressure is now 158/54.  They deny any changes in medications.  No trauma or head injury.  Patient did have an influenza vaccination this year.  ROS: Level 5 caveat for dementia  PAST MEDICAL HISTORY/PAST SURGICAL HISTORY:  Past Medical History:  Diagnosis Date  . Amputation of great toe, left, traumatic (HCC)   . Arthritis   . Asthma    as a young child  . Blind right eye    "since age 8-4; S/P whooping cough"  . BPH (benign prostatic hypertrophy)    Dr.Nesi is urologist  . Bruises easily    takes Pletal and ASA daily  . CAD (coronary artery disease)   . Claudication (HCC)   . Colitis    hx of  . Dementia   . Diabetes mellitus    borderline diabetic  . Gastritis   . Heart murmur   . History of shingles 3-83yrs ago  . Hx of colonic polyps   . Hyperlipidemia    takes Atorvastatin daily  . Hypertension    takes Ramipril and Metoprolol daily  . Insomnia    takes Ambien nightly  . Mental disorder    takes Aricept daily  . Palpitations    we need to exclude atrial fib and ventricular arrhythmia.  . Peripheral vascular disease (HCC)   . Urinary urgency    takes Flomax daily    MEDICATIONS:  Prior to Admission medications   Medication Sig Start Date End Date Taking? Authorizing Provider  acetaminophen-codeine (TYLENOL #3) 300-30 MG per tablet Take 1 tablet by mouth 2 (two) times daily. 10/17/14   [provider]  aspirin EC 325 MG tablet Take 325 mg by mouth at bedtime.     [provider]  meloxicam (MOBIC) 7.5 MG tablet Take 7.5 mg by mouth daily.    [provider]  metoprolol succinate (TOPROL-XL) 25 MG 24 hr tablet Take 1 tablet (25 mg total) by mouth 2 (two) times daily. 06/20/14   Lyn Records, MD  nitroGLYCERIN (NITROSTAT) 0.4 MG SL tablet Place 0.4 mg under the tongue every 5 (five) minutes as needed. For chest pain    [provider]  oxybutynin (DITROPAN-XL) 10 MG 24 hr tablet Take 10 mg by mouth daily. 07/03/11   [provider]  ramipril (ALTACE) 5 MG capsule Take 5 mg by mouth daily. 10/16/14   [provider]  sertraline (ZOLOFT) 25 MG tablet TK 1 T PO QD 11/19/16   [provider]  Tamsulosin HCl (FLOMAX) 0.4 MG CAPS Take 0.4 mg by mouth at bedtime.     [provider]  traMADol (ULTRAM) 50 MG tablet Take 1 tablet by mouth every 6 (six) hours as needed for moderate pain or severe pain.  05/10/13   [provider]  Vitamin D, Ergocalciferol, (DRISDOL) 50000 UNITS CAPS capsule Take 50,000 Units by mouth once a week. TUESDAY 06/12/14  [provider]  VOLTAREN 1 % GEL Apply 2 g topically 4 (four) times daily as needed (pain).  02/08/13   [provider]    ALLERGIES:  Allergies  Allergen Reactions  . Shrimp [Shellfish Allergy] Nausea And Vomiting    "haven't eaten any since 1980's" pt only allergic to shrimp. Not shellfish    SOCIAL HISTORY:  Social History   Tobacco Use  . Smoking status: Former Smoker    Types: Cigarettes    Last attempt to quit: 02/25/1940    Years since quitting: 77.1  . Smokeless tobacco: Never Used  Substance Use Topics  . Alcohol use: No    Comment: "last alcohol was ~ 1950's"    FAMILY HISTORY: Family History  Problem Relation Age of Onset  . Heart disease Father   . Cancer Mother   . Anesthesia problems Neg Hx   . Hypotension Neg Hx   . Malignant hyperthermia  Neg Hx   . Pseudochol deficiency Neg Hx     EXAM: BP (!) 180/58   Pulse (!) 103   Temp 99.4 F (37.4 C) (Rectal)   Resp 17   Ht 5\' 3"  (1.6 m)   Wt 64 kg (141 lb)   SpO2 100%   BMI 24.98 kg/m  CONSTITUTIONAL: Alert but does not answer questions or follow commands which family reports is his baseline.  Elderly, thin, chronically ill-appearing.  Rectal temp of 99.4. HEAD: Normocephalic, atraumatic EYES: Conjunctivae clear, pupils appear equal, EOMI ENT: normal nose; moist mucous membranes NECK: Supple, no meningismus, no nuchal rigidity, no LAD  CARD: RRR; S1 and S2 appreciated; no murmurs, no clicks, no rubs, no gallops RESP: Normal chest excursion without splinting or tachypnea; breath sounds clear and equal bilaterally; no wheezes, no rhonchi, no rales, no hypoxia or respiratory distress, speaking full sentences ABD/GI: Normal bowel sounds; non-distended; soft, non-tender, no rebound, no guarding, no peritoneal signs, no hepatosplenomegaly GU: Normal external genitalia, no penile discharge or hematuria BACK:  The back appears normal and is non-tender to palpation, there is no CVA tenderness, patient has severe kyphosis EXT: Normal ROM in all joints; non-tender to palpation; no edema; normal capillary refill; no cyanosis, no calf tenderness or swelling    SKIN: Normal color for age and race; warm; no rash NEURO: Moves all extremities equally   MEDICAL DECISION MAKING: Patient here with increased drowsiness and fever at home.  He is afebrile here.  At his neurologic baseline.  Blood pressure is now in the 150s/50s.  Family reports compliance with his medications.  No sign of trauma on exam.  Labs unremarkable.  Head CT shows no acute abnormality.  Will obtain chest x-ray and urinalysis.  Does not appear septic or dehydrated currently.  ED PROGRESS: Chest x-ray shows no infiltrate, edema, pneumothorax.  Urine shows no sign of infection or dehydration.  Suspect viral illness causing his  fever.  He is well-appearing and does not appear septic.  I feel he can be discharged home with family.  They are comfortable with this plan.  Discussed return precautions.  They have a PCP for follow-up.   At this time, I do not feel there is any life-threatening condition present. I have reviewed and discussed all results (EKG, imaging, lab, urine as appropriate) and exam findings with patient/family. I have reviewed nursing notes and appropriate previous records.  I feel the patient is safe to be discharged home without further emergent workup and can continue workup as an outpatient as needed. Discussed  usual and customary return precautions. Patient/family verbalize understanding and are comfortable with this plan.  Outpatient follow-up has been provided if needed. All questions have been answered.      EKG Interpretation  Date/Time:  Tuesday March 31 2017 06:06:06 EST Ventricular Rate:  97 PR Interval:    QRS Duration: 126 QT Interval:  388 QTC Calculation: 493 R Axis:   -120 Text Interpretation:  Normal sinus rhythm Multiform ventricular premature complexes Right bundle branch block Minimal ST elevation, inferior leads No significant change since last tracing Confirmed by Ward, Baxter Hire [id number]) on 03/31/2017 6:16:17 AM          Ward, Layla Maw, DO 03/31/17 6045

## 2017-03-31 NOTE — Discharge Instructions (Signed)
You may give Tylenol 1000 mg every 6 hours as needed for fever and pain.  Please continue medications as prescribed.  Blood pressure has improved here without intervention.  Chest x-ray, urinalysis, labs, head CT showed no acute abnormality.  Patient likely has a viral illness causing him to have low-grade fever which is causing him to sleep more than normal.  If you notice worsening mental status, elevated fevers, cough or difficulty breathing, vomiting, blood in his stool, weakness on one side of his body, changes in his speech or any other symptoms concerning to you, please return to the emergency department.

## 2017-06-05 ENCOUNTER — Encounter (HOSPITAL_COMMUNITY): Payer: Self-pay

## 2017-06-05 ENCOUNTER — Emergency Department (HOSPITAL_COMMUNITY)
Admission: EM | Admit: 2017-06-05 | Discharge: 2017-06-06 | Disposition: A | Discharge status: Home / Self Care | Payer: Medicare Other | Attending: Emergency Medicine | Admitting: Emergency Medicine

## 2017-06-05 DIAGNOSIS — Z87891 Personal history of nicotine dependence: Secondary | ICD-10-CM | POA: Diagnosis not present

## 2017-06-05 DIAGNOSIS — R1084 Generalized abdominal pain: Secondary | ICD-10-CM | POA: Insufficient documentation

## 2017-06-05 DIAGNOSIS — Z951 Presence of aortocoronary bypass graft: Secondary | ICD-10-CM | POA: Diagnosis not present

## 2017-06-05 DIAGNOSIS — I10 Essential (primary) hypertension: Secondary | ICD-10-CM | POA: Diagnosis not present

## 2017-06-05 DIAGNOSIS — R311 Benign essential microscopic hematuria: Secondary | ICD-10-CM | POA: Insufficient documentation

## 2017-06-05 DIAGNOSIS — Z79899 Other long term (current) drug therapy: Secondary | ICD-10-CM | POA: Insufficient documentation

## 2017-06-05 DIAGNOSIS — F0390 Unspecified dementia without behavioral disturbance: Secondary | ICD-10-CM | POA: Diagnosis not present

## 2017-06-05 DIAGNOSIS — I2510 Atherosclerotic heart disease of native coronary artery without angina pectoris: Secondary | ICD-10-CM | POA: Insufficient documentation

## 2017-06-05 LAB — COMPREHENSIVE METABOLIC PANEL
ALBUMIN: 3.7 g/dL (ref 3.5–5.0)
ALT: 10 U/L — AB (ref 17–63)
AST: 17 U/L (ref 15–41)
Alkaline Phosphatase: 86 U/L (ref 38–126)
Anion gap: 9 (ref 5–15)
BILIRUBIN TOTAL: 0.5 mg/dL (ref 0.3–1.2)
BUN: 26 mg/dL — AB (ref 6–20)
CO2: 25 mmol/L (ref 22–32)
CREATININE: 1.39 mg/dL — AB (ref 0.61–1.24)
Calcium: 9 mg/dL (ref 8.9–10.3)
Chloride: 101 mmol/L (ref 101–111)
GFR calc Af Amer: 50 mL/min — ABNORMAL LOW (ref >60)
GFR, EST NON AFRICAN AMERICAN: 43 mL/min — AB (ref >60)
GLUCOSE: 106 mg/dL — AB (ref 65–99)
Potassium: 4.6 mmol/L (ref 3.5–5.1)
Sodium: 135 mmol/L (ref 135–145)
Total Protein: 6.9 g/dL (ref 6.5–8.1)

## 2017-06-05 LAB — LIPASE, BLOOD: Lipase: 24 U/L (ref 11–51)

## 2017-06-05 LAB — CBC
HEMATOCRIT: 35 % — AB (ref 39.0–52.0)
Hemoglobin: 11.8 g/dL — ABNORMAL LOW (ref 13.0–17.0)
MCH: 29.7 pg (ref 26.0–34.0)
MCHC: 33.7 g/dL (ref 30.0–36.0)
MCV: 88.2 fL (ref 78.0–100.0)
PLATELETS: 111 10*3/uL — AB (ref 150–400)
RBC: 3.97 MIL/uL — ABNORMAL LOW (ref 4.22–5.81)
RDW: 14.8 % (ref 11.5–15.5)
WBC: 4.1 10*3/uL (ref 4.0–10.5)

## 2017-06-05 NOTE — ED Provider Notes (Signed)
[facility] Provider Note   CSN: [id number] Arrival date & time: 06/05/17  1946     History   Chief Complaint Chief Complaint  Patient presents with  . Abdominal Pain    HPI Reece PackerLonnie E Sabbagh Jr. is a 84 y.o. male with a history of BPH, colitis, dementia, asthma, and PVD who presents to the emergency department with his wife with a chief complaint of abdominal pain.  The patient's wife reports that the patient came home from adult daycare this afternoon and stated that he did not feel good and was pointing and rubbing his abdomen.  Denies fever, chills, chest pain, dyspnea, nausea, vomiting, diarrhea, dysuria, back pain, or rash.  Last bowel movement was 2 days ago until earlier tonight when the patient had a bowel movement while sitting in the waiting room.  No treatment prior to arrival.  Level 5 caveat secondary to dementia.  The history is provided by the spouse and the patient. No language interpreter was used.    Past Medical History:  Diagnosis Date  . Amputation of great toe, left, traumatic (HCC)   . Arthritis   . Asthma    as a young child  . Blind right eye    "since age 48-4; S/P whooping cough"  . BPH (benign prostatic hypertrophy)    Dr.Nesi is urologist  . Bruises easily    takes Pletal and ASA daily  . CAD (coronary artery disease)   . Claudication (HCC)   . Colitis    hx of  . Dementia   . Diabetes mellitus    borderline diabetic  . Gastritis   . Heart murmur   . History of shingles 3-8269yrs ago  . Hx of colonic polyps   . Hyperlipidemia    takes Atorvastatin daily  . Hypertension    takes Ramipril and Metoprolol daily  . Insomnia    takes Ambien nightly  . Mental disorder    takes Aricept daily  . Palpitations    we need to exclude atrial fib and ventricular arrhythmia.  . Peripheral vascular disease (HCC)   . Urinary urgency    takes Flomax daily    Patient Active Problem List   Diagnosis Date Noted    . CAD (coronary artery disease) of artery bypass graft 11/24/2012    Class: Chronic  . S/P aortic valve replacement with bioprosthetic valve 11/24/2012    Class: Chronic  . Amputation, toe, traumatic (HCC) 07/04/2011  . Hematoma of groin 06/19/2011  . Atherosclerosis of native arteries of the extremities with ulceration(440.23) 06/19/2011  . Other complications due to other vascular device, implant, and graft 06/19/2011  . PVD (peripheral vascular disease) with claudication (HCC) 06/05/2011    Past Surgical History:  Procedure Laterality Date  . AMPUTATION  07/04/2011   Procedure: AMPUTATION FOOT;  Surgeon: Loanne DrillingFrank V Aluisio, MD;  Location: [facility] OR;  Service: Orthopedics;  Laterality: Left;  . AORTIC VALVE REPLACEMENT  06/2003   /E-chart  . AXILLARY-FEMORAL BYPASS GRAFT  05/05/2011   Procedure: BYPASS GRAFT AXILLA-BIFEMORAL;  Surgeon: Sherren Kernsharles E Fields, MD;  [facility];  Service: Vascular;  Laterality: Right;  . AXILLARY-FEMORAL BYPASS GRAFT  06/10/2011   Procedure: BYPASS GRAFT AXILLA-BIFEMORAL;  Surgeon: Sherren Kernsharles E Fields, MD;  [facility];  Service: Vascular;  Laterality: Right;  REVISION Right axillary bifemoral bypass using a 8mm x 80 cm Propaten Graft  . BAND HEMORRHOIDECTOMY  1960's  . CARDIAC CATHETERIZATION     multiple-see epic  .  CARDIAC VALVE REPLACEMENT  06/2003  . CATARACT EXTRACTION     left eye  . COLONOSCOPY    . CORONARY ANGIOPLASTY     right leg  . CORONARY ANGIOPLASTY WITH STENT PLACEMENT  2000   /E-chart  . CORONARY ARTERY BYPASS GRAFT  1989/06/2003   CABG X 5; CABG X1  . FEMORAL-POPLITEAL BYPASS GRAFT  05/05/2011   Procedure: BYPASS GRAFT FEMORAL-POPLITEAL ARTERY;  Surgeon: Sherren Kerns, MD;  Location: [facility] OR;  Service: Vascular;  Laterality: Right;  . I&D EXTREMITY  07/04/2011   Procedure: IRRIGATION AND DEBRIDEMENT EXTREMITY;  Surgeon: Loanne Drilling, MD;  Location: [facility] OR;  Service: Orthopedics;  Laterality: Left;  . OSTECTOMY  09/2001   right  hip/E-chart  . PERIPHERAL VASCULAR CATHETERIZATION N/A 08/04/2014   Procedure: Aortic Arch Angiography;  Surgeon: Sherren Kerns, MD;  Location: [facility] INVASIVE CV LAB;  Service: Cardiovascular;  Laterality: N/A;  . PERIPHERAL VASCULAR CATHETERIZATION Right 08/04/2014   Procedure: Upper Extremity Angiography;  Surgeon: Sherren Kerns, MD;  Location: [facility] INVASIVE CV LAB;  Service: Cardiovascular;  Laterality: Right;  . TONSILLECTOMY  1960        Home Medications    Prior to Admission medications   Medication Sig Start Date End Date Taking? Authorizing Provider  acetaminophen-codeine (TYLENOL #3) 300-30 MG per tablet Take 1 tablet by mouth 2 (two) times daily. 10/17/14   [provider]  glycopyrrolate (ROBINUL) 1 MG tablet Take 1 mg by mouth daily as needed (spitting).    [provider]  metoprolol succinate (TOPROL-XL) 25 MG 24 hr tablet Take 1 tablet (25 mg total) by mouth 2 (two) times daily. 06/20/14   Lyn Records, MD  nitroGLYCERIN (NITROSTAT) 0.4 MG SL tablet Place 0.4 mg under the tongue every 5 (five) minutes as needed. For chest pain    [provider]  risperiDONE (RISPERDAL) 0.5 MG tablet Take 0.5 mg by mouth at bedtime.    [provider]  sertraline (ZOLOFT) 25 MG tablet TK 1 T PO QD 11/19/16   [provider]    Family History Family History  Problem Relation Age of Onset  . Heart disease Father   . Cancer Mother   . Anesthesia problems Neg Hx   . Hypotension Neg Hx   . Malignant hyperthermia Neg Hx   . Pseudochol deficiency Neg Hx     Social History Social History   Tobacco Use  . Smoking status: Former Smoker    Types: Cigarettes    Last attempt to quit: 02/25/1940    Years since quitting: 77.3  . Smokeless tobacco: Never Used  Substance Use Topics  . Alcohol use: No    Comment: "last alcohol was ~ 1950's"  . Drug use: No     Allergies   Shrimp [shellfish allergy]   Review of Systems Review of Systems   Unable to perform ROS: Dementia  Constitutional: Negative for chills and fever.  Respiratory: Negative for cough and shortness of breath.   Cardiovascular: Negative for chest pain.  Gastrointestinal: Positive for abdominal pain. Negative for nausea and vomiting.     Physical Exam Updated Vital Signs BP (!) 123/98 (BP Location: Right Arm)   Pulse 69   Temp 98.1 F (36.7 C) (Oral)   Resp 16   SpO2 97%   Physical Exam  Constitutional: He appears well-developed.  HENT:  Head: Normocephalic.  Eyes: Conjunctivae are normal. No scleral icterus.  Neck: Neck supple.  Cardiovascular: Normal rate, regular rhythm,  normal heart sounds and intact distal pulses. Exam reveals no gallop and no friction rub.  No murmur heard. Pulmonary/Chest: Effort normal. No stridor. No respiratory distress. He has no wheezes. He has no rales. He exhibits no tenderness.  Abdominal: Soft. Bowel sounds are normal. He exhibits no distension and no mass. There is no tenderness. There is no rebound and no guarding. No hernia.  Abdomen is soft, nondistended.  Normoactive bowel sounds in all 4 quadrants.  No reproducible tenderness to palpation on exam.  No rebound or guarding.  No peritoneal signs.  No CVA tenderness bilaterally.  Negative Murphy sign.  No tenderness over McBurney's point.  Musculoskeletal: He exhibits no tenderness.  Neurological: He is alert.  Skin: Skin is warm and dry. Capillary refill takes less than 2 seconds. No rash noted.  Psychiatric: His behavior is normal.  Nursing note and vitals reviewed.    ED Treatments / Results  Labs (all labs ordered are listed, but only abnormal results are displayed) Labs Reviewed  COMPREHENSIVE METABOLIC PANEL - Abnormal; Notable for the following components:      Result Value   Glucose, Bld 106 (*)    BUN 26 (*)    Creatinine, Ser 1.39 (*)    ALT 10 (*)    GFR calc non Af Amer 43 (*)    GFR calc Af Amer 50 (*)    All other components within normal  limits  CBC - Abnormal; Notable for the following components:   RBC 3.97 (*)    Hemoglobin 11.8 (*)    HCT 35.0 (*)    Platelets 111 (*)    All other components within normal limits  URINALYSIS, ROUTINE W REFLEX MICROSCOPIC - Abnormal; Notable for the following components:   Color, Urine RED (*)    APPearance CLOUDY (*)    Hgb urine dipstick LARGE (*)    Protein, ur 100 (*)    All other components within normal limits  URINALYSIS, MICROSCOPIC (REFLEX) - Abnormal; Notable for the following components:   Squamous Epithelial / LPF 0-5 (*)    All other components within normal limits  LIPASE, BLOOD  POC OCCULT BLOOD, ED    EKG None  Radiology Ct Renal Stone Study  Result Date: 06/06/2017 CLINICAL DATA:  Hematuria. History of benign prostatic hypertrophy, colonic polyps, hemorrhoidectomy. EXAM: CT ABDOMEN AND PELVIS WITHOUT CONTRAST TECHNIQUE: Multidetector CT imaging of the abdomen and pelvis was performed following the standard protocol without IV contrast. COMPARISON:  CT angiogram runoff March 10, 2011 FINDINGS: LOWER CHEST: Lung bases are clear. The heart is mildly enlarged. Severe coronary artery calcifications and annular calcifications. HEPATOBILIARY: Normal. PANCREAS: Normal. SPLEEN: Normal. ADRENALS/URINARY TRACT: Kidneys are orthotopic, atrophic LEFT kidney similar to prior CT. Mild RIGHT nephromegaly. Bilateral renal cysts measuring to 6.3 cm on the RIGHT, relatively unchanged. No nephrolithiasis, hydronephrosis; limited assessment for renal masses on this nonenhanced examination. The unopacified ureters are normal in course and caliber. Urinary bladder is well distended with small fundal diverticulum. Mild urinary bladder wall thickening similar to prior examination most consistent with chronic outlet obstruction. Mild cortical thickening of adrenal glands seen with hyperplasia. STOMACH/BOWEL: The stomach, small and large bowel are normal in course and caliber without inflammatory  changes, sensitivity decreased by lack of enteric contrast. Mild colonic diverticulosis. Mild amount of retained large bowel stool. Normal appendix. VASCULAR/LYMPHATIC: Interval axial femoral bypass with graft in RIGHT abdominal wall. Severe calcific atherosclerosis aortoiliac vessels. No lymphadenopathy by CT size criteria. REPRODUCTIVE: Mild prostatomegaly invading base  of bladder. OTHER: No intraperitoneal free fluid or free air. MUSCULOSKELETAL: Non-acute.  Severe lumbar spondylosis. IMPRESSION: 1. No nephrolithiasis or obstructive uropathy. Similarly atrophic LEFT kidney. Suspected chronic bladder outlet obstruction. 2. Interval axillary-femoral bypass. Electronically Signed   By: Awilda Metro M.D.   On: 06/06/2017 01:47    Procedures Procedures (including critical care time)  Medications Ordered in ED Medications - No data to display   Initial Impression / Assessment and Plan / ED Course  I have reviewed the triage vital signs and the nursing notes.  Pertinent labs & imaging results that were available during my care of the patient were reviewed by me and considered in my medical decision making (see chart for details).     84 year old male with a history of BPH, colitis, dementia, asthma, and PVD who presents to the emergency department with a chief complaint of abdominal pain.  CBC with hemoglobin of 11.8, down from 13.7, CMP is at the patient's baseline.  Urinalysis with hematuria and many RBCs, which is new from previous.  Hemoccult negative.  No history of nephrolithiasis.  CT stone study is negative.  Chronic bladder outlet obstruction is noted.  Discussed these findings with the patient and his wife.  The patient continues to remain asymptomatic.  Symptoms could have been secondary to constipation, which is now resolved.  Doubt ischemic colitis, mesenteric ischemia, intra-abdominal infection, or nephrolithiasis.  MiraLAX recommended for home constipation use.  The patient was seen  and evaluated with Dr. Adela Lank, attending physician who is in agreement with the workup and plan.  Will discharge to home with follow-up to PCP or urology for repeat urinalysis.  Strict return precautions given.  No acute distress.  The patient is safe for discharge at this time.  Final Clinical Impressions(s) / ED Diagnoses   Final diagnoses:  Generalized abdominal pain  Benign essential microscopic hematuria    ED Discharge Orders    None       Keli Buehner, Coral Else, PA-C 06/06/17 0553    Melene Plan, DO 06/06/17 1610

## 2017-06-05 NOTE — ED Triage Notes (Signed)
Pt comes with with, per wife states that he came home from daycare and stated that he did not feel good and was rubbing his belly. Pt denies abd pain now, wife states the daycare reported nothing abnormal to her. Denies n/v/d. Pt has dementia and incontinent of bowel and bladder

## 2017-06-06 ENCOUNTER — Emergency Department (HOSPITAL_COMMUNITY): Payer: Medicare Other

## 2017-06-06 DIAGNOSIS — R1084 Generalized abdominal pain: Secondary | ICD-10-CM | POA: Diagnosis not present

## 2017-06-06 LAB — URINALYSIS, MICROSCOPIC (REFLEX)
BACTERIA UA: NONE SEEN
WBC, UA: NONE SEEN WBC/hpf (ref 0–5)

## 2017-06-06 LAB — URINALYSIS, ROUTINE W REFLEX MICROSCOPIC
BILIRUBIN URINE: NEGATIVE
GLUCOSE, UA: NEGATIVE mg/dL
KETONES UR: NEGATIVE mg/dL
LEUKOCYTES UA: NEGATIVE
Nitrite: NEGATIVE
PH: 6 (ref 5.0–8.0)
PROTEIN: 100 mg/dL — AB
Specific Gravity, Urine: 1.013 (ref 1.005–1.030)

## 2017-06-06 LAB — POC OCCULT BLOOD, ED: FECAL OCCULT BLD: NEGATIVE

## 2017-06-06 NOTE — ED Notes (Signed)
Patient transported to CT scan . 

## 2017-06-06 NOTE — Discharge Instructions (Signed)
Please follow up with [facility] or with Dr. Concepcion ElkAvbuere to have your urine rechecked since it showed some microscopic hemoglobin today.  Miralax is available over-the-counter and can be used for constipation. Use as directed on the label.  If [name] develops new or worsening symptoms including vomiting, if his belly gets big and swollen, if he develops a fever, or stops being able to urinate, or other new concerning symptoms, please return to the Emergency Department for re-evaluation.

## 2017-09-05 ENCOUNTER — Emergency Department (HOSPITAL_COMMUNITY)
Admission: EM | Admit: 2017-09-05 | Discharge: 2017-09-06 | Disposition: A | Discharge status: Home / Self Care | Payer: Medicare Other | Attending: Emergency Medicine | Admitting: Emergency Medicine

## 2017-09-05 ENCOUNTER — Encounter (HOSPITAL_COMMUNITY): Payer: Self-pay | Admitting: Emergency Medicine

## 2017-09-05 ENCOUNTER — Other Ambulatory Visit: Payer: Self-pay

## 2017-09-05 ENCOUNTER — Emergency Department (HOSPITAL_COMMUNITY): Payer: Medicare Other

## 2017-09-05 DIAGNOSIS — Y929 Unspecified place or not applicable: Secondary | ICD-10-CM | POA: Diagnosis not present

## 2017-09-05 DIAGNOSIS — Z79899 Other long term (current) drug therapy: Secondary | ICD-10-CM | POA: Diagnosis not present

## 2017-09-05 DIAGNOSIS — J45909 Unspecified asthma, uncomplicated: Secondary | ICD-10-CM | POA: Insufficient documentation

## 2017-09-05 DIAGNOSIS — Y999 Unspecified external cause status: Secondary | ICD-10-CM | POA: Diagnosis not present

## 2017-09-05 DIAGNOSIS — W010XXA Fall on same level from slipping, tripping and stumbling without subsequent striking against object, initial encounter: Secondary | ICD-10-CM | POA: Diagnosis not present

## 2017-09-05 DIAGNOSIS — W19XXXA Unspecified fall, initial encounter: Secondary | ICD-10-CM

## 2017-09-05 DIAGNOSIS — Z87891 Personal history of nicotine dependence: Secondary | ICD-10-CM | POA: Diagnosis not present

## 2017-09-05 DIAGNOSIS — S3992XA Unspecified injury of lower back, initial encounter: Secondary | ICD-10-CM | POA: Insufficient documentation

## 2017-09-05 DIAGNOSIS — I10 Essential (primary) hypertension: Secondary | ICD-10-CM | POA: Insufficient documentation

## 2017-09-05 DIAGNOSIS — Y939 Activity, unspecified: Secondary | ICD-10-CM | POA: Insufficient documentation

## 2017-09-05 DIAGNOSIS — F0390 Unspecified dementia without behavioral disturbance: Secondary | ICD-10-CM | POA: Insufficient documentation

## 2017-09-05 DIAGNOSIS — I2510 Atherosclerotic heart disease of native coronary artery without angina pectoris: Secondary | ICD-10-CM | POA: Insufficient documentation

## 2017-09-05 MED ORDER — ACETAMINOPHEN-CODEINE #3 300-30 MG PO TABS
1.0000 | ORAL_TABLET | Freq: Once | ORAL | Status: AC
  Administered 2017-09-05: 1 via ORAL
  Filled 2017-09-05: qty 1

## 2017-09-05 NOTE — ED Triage Notes (Signed)
Per wife patient fell at home today, did not hit his head and no LOC, not on blood thinners Pt has Alzheimer's and has a hard time walking at home. Patient is hard of hearing and is blind in right eye, non verbal in triage.

## 2017-09-05 NOTE — ED Provider Notes (Signed)
[name]MENT Provider Note   CSN: [id number] Arrival date & time: 09/05/17  2004     History   Chief Complaint Chief Complaint  Patient presents with  . Fall    HPI [name]. is a 84 y.o. male.  HPI [name]. is a 84 y.o. male with history of dementia, coronary artery disease, diabetes, hypertension, peripheral vascular disease, shuffling gait, presents to emergency department after fall.  Patient's wife states that patient falls pretty frequently.  Today, patient tripped and fell backwards.  He did not hit his head.  States that he fell around 4 PM.  Since then he has been complaining of severe lower back pain which is new for him.  He has not been able to ambulate normally since the fall.  Patient denies any complaints at this time.  He is oriented at baseline.  Past Medical History:  Diagnosis Date  . Amputation of great toe, left, traumatic (HCC)   . Arthritis   . Asthma    as a young child  . Blind right eye    "since age 78-4; S/P whooping cough"  . BPH (benign prostatic hypertrophy)    Dr.Nesi is urologist  . Bruises easily    takes Pletal and ASA daily  . CAD (coronary artery disease)   . Claudication (HCC)   . Colitis    hx of  . Dementia   . Diabetes mellitus    borderline diabetic  . Gastritis   . Heart murmur   . History of shingles 3-44yrs ago  . Hx of colonic polyps   . Hyperlipidemia    takes Atorvastatin daily  . Hypertension    takes Ramipril and Metoprolol daily  . Insomnia    takes Ambien nightly  . Mental disorder    takes Aricept daily  . Palpitations    we need to exclude atrial fib and ventricular arrhythmia.  . Peripheral vascular disease (HCC)   . Urinary urgency    takes Flomax daily    Patient Active Problem List   Diagnosis Date Noted  . CAD (coronary artery disease) of artery bypass graft 11/24/2012    Class: Chronic  . S/P aortic valve replacement with bioprosthetic  valve 11/24/2012    Class: Chronic  . Amputation, toe, traumatic (HCC) 07/04/2011  . Hematoma of groin 06/19/2011  . Atherosclerosis of native arteries of the extremities with ulceration(440.23) 06/19/2011  . Other complications due to other vascular device, implant, and graft 06/19/2011  . PVD (peripheral vascular disease) with claudication (HCC) 06/05/2011    Past Surgical History:  Procedure Laterality Date  . AMPUTATION  07/04/2011   Procedure: AMPUTATION FOOT;  Surgeon: Loanne Drilling, MD;  Location: [facility] OR;  Service: Orthopedics;  Laterality: Left;  . AORTIC VALVE REPLACEMENT  06/2003   /E-chart  . AXILLARY-FEMORAL BYPASS GRAFT  05/05/2011   Procedure: BYPASS GRAFT AXILLA-BIFEMORAL;  Surgeon: Sherren Kerns, MD;  Location: [facility] OR;  Service: Vascular;  Laterality: Right;  . AXILLARY-FEMORAL BYPASS GRAFT  06/10/2011   Procedure: BYPASS GRAFT AXILLA-BIFEMORAL;  Surgeon: Sherren Kerns, MD;  Location: [facility] OR;  Service: Vascular;  Laterality: Right;  REVISION Right axillary bifemoral bypass using a 8mm x 80 cm Propaten Graft  . BAND HEMORRHOIDECTOMY  1960's  . CARDIAC CATHETERIZATION     multiple-see epic  . CARDIAC VALVE REPLACEMENT  06/2003  . CATARACT EXTRACTION     left eye  . COLONOSCOPY    .  CORONARY ANGIOPLASTY     right leg  . CORONARY ANGIOPLASTY WITH STENT PLACEMENT  2000   /E-chart  . CORONARY ARTERY BYPASS GRAFT  1989/06/2003   CABG X 5; CABG X1  . FEMORAL-POPLITEAL BYPASS GRAFT  05/05/2011   Procedure: BYPASS GRAFT FEMORAL-POPLITEAL ARTERY;  Surgeon: Sherren Kernsharles E Fields, MD;  [facility];  Service: Vascular;  Laterality: Right;  . I&D EXTREMITY  07/04/2011   Procedure: IRRIGATION AND DEBRIDEMENT EXTREMITY;  Surgeon: Loanne DrillingFrank V Aluisio, MD;  Location: [facility] OR;  Service: Orthopedics;  Laterality: Left;  . OSTECTOMY  09/2001   right hip/E-chart  . PERIPHERAL VASCULAR CATHETERIZATION N/A 08/04/2014   Procedure: Aortic Arch Angiography;  Surgeon: Sherren Kernsharles E Fields, MD;  [facility];  Service: Cardiovascular;  Laterality: N/A;  . PERIPHERAL VASCULAR CATHETERIZATION Right 08/04/2014   Procedure: Upper Extremity Angiography;  Surgeon: Sherren Kernsharles E Fields, MD;  [facility];  Service: Cardiovascular;  Laterality: Right;  . TONSILLECTOMY  1960        Home Medications    Prior to Admission medications   Medication Sig Start Date End Date Taking? Authorizing Provider  acetaminophen-codeine (TYLENOL #3) 300-30 MG per tablet Take 1 tablet by mouth 2 (two) times daily. 10/17/14   [provider]  glycopyrrolate (ROBINUL) 1 MG tablet Take 1 mg by mouth daily as needed (spitting).    [provider]  metoprolol succinate (TOPROL-XL) 25 MG 24 hr tablet Take 1 tablet (25 mg total) by mouth 2 (two) times daily. 06/20/14   Lyn RecordsSmith, Henry W, MD  nitroGLYCERIN (NITROSTAT) 0.4 MG SL tablet Place 0.4 mg under the tongue every 5 (five) minutes as needed. For chest pain    [provider]  risperiDONE (RISPERDAL) 0.5 MG tablet Take 0.5 mg by mouth at bedtime.    [provider]  sertraline (ZOLOFT) 25 MG tablet TK 1 T PO QD 11/19/16   [provider]    Family History Family History  Problem Relation Age of Onset  . Heart disease Father   . Cancer Mother   . Anesthesia problems Neg Hx   . Hypotension Neg Hx   . Malignant hyperthermia Neg Hx   . Pseudochol deficiency Neg Hx     Social History Social History   Tobacco Use  . Smoking status: Former Smoker    Types: Cigarettes    Last attempt to quit: 02/25/1940    Years since quitting: 77.5  . Smokeless tobacco: Never Used  Substance Use Topics  . Alcohol use: No    Comment: "last alcohol was ~ 1950's"  . Drug use: No     Allergies   Shrimp [shellfish allergy]   Review of Systems Review of Systems  Unable to perform ROS: Dementia  Musculoskeletal: Positive for back pain.     Physical Exam Updated Vital Signs BP (!) 162/66 (BP Location: Left  Arm)   Pulse (!) 54   Temp 98.4 F (36.9 C) (Oral)   Resp 15   Ht 5\' 2"  (1.575 m)   Wt 65.8 kg (145 lb)   SpO2 99%   BMI 26.52 kg/m   Physical Exam  Constitutional: He appears well-developed and well-nourished. No distress.  HENT:  Head: Normocephalic and atraumatic.  Eyes: Conjunctivae are normal.  Neck: Neck supple.  Cardiovascular: Normal rate, regular rhythm and normal heart sounds.  Pulmonary/Chest: Effort normal. No respiratory distress. He has no wheezes. He has no rales.  Abdominal: Soft. Bowel sounds are normal. He  exhibits no distension. There is no tenderness. There is no rebound.  Musculoskeletal: He exhibits no edema.  No tenderness to palpation over thoracic or lumbar spine.  No deformity.  Full range of motion bilateral hip.  Full range of motion bilateral knees and ankles.  Neurological: He is alert.  Skin: Skin is warm and dry.  Nursing note and vitals reviewed.    ED Treatments / Results  Labs (all labs ordered are listed, but only abnormal results are displayed) Labs Reviewed - No data to display  EKG None  Radiology Dg Lumbar Spine Complete  Result Date: 09/05/2017 CLINICAL DATA:  84 year old male with fall and back pain. EXAM: LUMBAR SPINE - COMPLETE 4+ VIEW COMPARISON:  CT of the abdomen pelvis dated 06/06/2017 FINDINGS: There is no acute fracture or subluxation of the lumbar spine. There are multilevel degenerative changes with endplate irregularity and disc space narrowing. Multilevel disc desiccation with vacuum phenomena. The visualized posterior elements are intact. There is atherosclerotic calcification of the aorta. IMPRESSION: No acute/traumatic lumbar spine pathology. Multilevel degenerative changes. Electronically Signed   By: Elgie Collard M.D.   On: 09/05/2017 22:54   Dg Hips Bilat W Or Wo Pelvis 3-4 Views  Result Date: 09/05/2017 CLINICAL DATA:  Back pain after a fall. EXAM: DG HIP (WITH OR WITHOUT PELVIS) 3-4V BILAT COMPARISON:   02/28/2016 FINDINGS: AP view pelvis and AP/frog leg views of the bilateral hips. Extensive vascular calcifications. Surgical clips in both inguinal regions. No acute fracture or dislocation. Grossly normal sacroiliac joints. Lumbosacral spondylosis. IMPRESSION: No acute osseous abnormality. Electronically Signed   By: Jeronimo Greaves M.D.   On: 09/05/2017 22:57    Procedures Procedures (including critical care time)  Medications Ordered in ED Medications  acetaminophen-codeine (TYLENOL #3) 300-30 MG per tablet 1 tablet (has no administration in time range)     Initial Impression / Assessment and Plan / ED Course  I have reviewed the triage vital signs and the nursing notes.  Pertinent labs & imaging results that were available during my care of the patient were reviewed by me and considered in my medical decision making (see chart for details).     Patient in emergency department with a mechanical fall backwards.  Complaining of back pain.  No head injury.  Wife at bedside, witnessed the fall.  11:58 PM X-rays negative.  Patient was able to ambulate in department, slow shuffling gait, but normal according to his wife.  I think patient is stable to go home, will have him follow-up with primary care doctor as needed.  Discussed patient with Dr. Jeraldine Loots who has seen him and agrees with the plan.  Vitals:   09/05/17 2024 09/05/17 2200 09/05/17 2230 09/05/17 2300  BP: (!) 150/49 (!) 162/66    Pulse: (!) 58 (!) 56 60 (!) 51  Resp: 16 15    Temp: 98.9 F (37.2 C) 98.4 F (36.9 C)    TempSrc: Oral Oral    SpO2: 100% 98% 100% 99%  Weight: 65.8 kg (145 lb)     Height: 5\' 2"  (1.575 m)          Final Clinical Impressions(s) / ED Diagnoses   Final diagnoses:  Fall, initial encounter  Injury of back, initial encounter    ED Discharge Orders    None       Jaynie Crumble, Cordelia Poche 09/05/17 2359    Gerhard Munch, MD 09/06/17 0005

## 2017-09-05 NOTE — Discharge Instructions (Addendum)
Continue tylenol #3 for pain. Follow up with family doctor next week if not improving.

## 2017-09-05 NOTE — ED Notes (Signed)
  Patient ambulated in hallway.  Patient shuffles feet and stares at ground while walking but this is his normal.  Jaynie Crumbleatyana Kirichenko PA notified.

## 2017-09-15 ENCOUNTER — Other Ambulatory Visit: Payer: Self-pay

## 2017-09-15 ENCOUNTER — Emergency Department (HOSPITAL_COMMUNITY)
Admission: EM | Admit: 2017-09-15 | Discharge: 2017-09-15 | Disposition: A | Discharge status: Home / Self Care | Payer: Medicare Other | Attending: Emergency Medicine | Admitting: Emergency Medicine

## 2017-09-15 ENCOUNTER — Encounter (HOSPITAL_COMMUNITY): Payer: Self-pay | Admitting: *Deleted

## 2017-09-15 ENCOUNTER — Ambulatory Visit (INDEPENDENT_AMBULATORY_CARE_PROVIDER_SITE_OTHER): Payer: Medicare Other | Admitting: Urgent Care

## 2017-09-15 ENCOUNTER — Encounter: Payer: Self-pay | Admitting: Urgent Care

## 2017-09-15 VITALS — BP 115/65 | HR 69 | Temp 98.4°F

## 2017-09-15 DIAGNOSIS — Z955 Presence of coronary angioplasty implant and graft: Secondary | ICD-10-CM | POA: Insufficient documentation

## 2017-09-15 DIAGNOSIS — R310 Gross hematuria: Secondary | ICD-10-CM | POA: Diagnosis present

## 2017-09-15 DIAGNOSIS — N183 Chronic kidney disease, stage 3 unspecified: Secondary | ICD-10-CM

## 2017-09-15 DIAGNOSIS — Z87891 Personal history of nicotine dependence: Secondary | ICD-10-CM | POA: Insufficient documentation

## 2017-09-15 DIAGNOSIS — Z951 Presence of aortocoronary bypass graft: Secondary | ICD-10-CM | POA: Diagnosis not present

## 2017-09-15 DIAGNOSIS — R319 Hematuria, unspecified: Secondary | ICD-10-CM

## 2017-09-15 DIAGNOSIS — J45909 Unspecified asthma, uncomplicated: Secondary | ICD-10-CM | POA: Diagnosis not present

## 2017-09-15 DIAGNOSIS — F0390 Unspecified dementia without behavioral disturbance: Secondary | ICD-10-CM | POA: Diagnosis not present

## 2017-09-15 DIAGNOSIS — I10 Essential (primary) hypertension: Secondary | ICD-10-CM

## 2017-09-15 DIAGNOSIS — I2510 Atherosclerotic heart disease of native coronary artery without angina pectoris: Secondary | ICD-10-CM | POA: Insufficient documentation

## 2017-09-15 DIAGNOSIS — Z79899 Other long term (current) drug therapy: Secondary | ICD-10-CM | POA: Insufficient documentation

## 2017-09-15 LAB — CBC
HCT: 33.4 % — ABNORMAL LOW (ref 39.0–52.0)
HEMOGLOBIN: 10.6 g/dL — AB (ref 13.0–17.0)
MCH: 29.9 pg (ref 26.0–34.0)
MCHC: 31.7 g/dL (ref 30.0–36.0)
MCV: 94.4 fL (ref 78.0–100.0)
Platelets: 99 10*3/uL — ABNORMAL LOW (ref 150–400)
RBC: 3.54 MIL/uL — AB (ref 4.22–5.81)
RDW: 14 % (ref 11.5–15.5)
WBC: 3.7 10*3/uL — ABNORMAL LOW (ref 4.0–10.5)

## 2017-09-15 LAB — POCT URINALYSIS DIP (MANUAL ENTRY)
Glucose, UA: NEGATIVE mg/dL
Nitrite, UA: NEGATIVE
SPEC GRAV UA: 1.02 (ref 1.010–1.025)
Urobilinogen, UA: 2 E.U./dL — AB
pH, UA: 6 (ref 5.0–8.0)

## 2017-09-15 LAB — URINALYSIS, ROUTINE W REFLEX MICROSCOPIC
Bacteria, UA: NONE SEEN
Bilirubin Urine: NEGATIVE
Glucose, UA: NEGATIVE mg/dL
KETONES UR: NEGATIVE mg/dL
Leukocytes, UA: NEGATIVE
Nitrite: NEGATIVE
PH: 5 (ref 5.0–8.0)
PROTEIN: NEGATIVE mg/dL
Specific Gravity, Urine: 1.016 (ref 1.005–1.030)

## 2017-09-15 LAB — BASIC METABOLIC PANEL
ANION GAP: 9 (ref 5–15)
BUN: 31 mg/dL — ABNORMAL HIGH (ref 8–23)
CALCIUM: 9.2 mg/dL (ref 8.9–10.3)
CO2: 28 mmol/L (ref 22–32)
Chloride: 104 mmol/L (ref 98–111)
Creatinine, Ser: 1.42 mg/dL — ABNORMAL HIGH (ref 0.61–1.24)
GFR, EST AFRICAN AMERICAN: 49 mL/min — AB (ref >60)
GFR, EST NON AFRICAN AMERICAN: 42 mL/min — AB (ref >60)
Glucose, Bld: 105 mg/dL — ABNORMAL HIGH (ref 70–99)
Potassium: 4.5 mmol/L (ref 3.5–5.1)
Sodium: 141 mmol/L (ref 135–145)

## 2017-09-15 LAB — POC MICROSCOPIC URINALYSIS (UMFC): Mucus: ABSENT

## 2017-09-15 NOTE — ED Notes (Signed)
ED Provider at bedside. 

## 2017-09-15 NOTE — ED Notes (Signed)
Pt unable to urine. Attempted to get sample however was only able to get a few drops of blood.

## 2017-09-15 NOTE — Discharge Instructions (Addendum)
We saw you in the ER for bloody urine. Urine analysis did not show any evidence of infection.  We will send the urine for cultures, to see if any bacteria eventually grows.  If the urine culture does grow bacteria we will call you with a prescription for UTI.  As discussed, the next step would be for you to follow-up with the urologist. Return to the ER if the symptoms get worse and Mr. [name] starts passing large clots or he starts having confusions, fevers or chills.

## 2017-09-15 NOTE — ED Provider Notes (Signed)
Patient placed in Quick Look pathway, seen and evaluated   Chief Complaint: blood from penid  HPI:   Pt sent here from Urgent care for penile bleding  ROS:  No fever, no abdominal pain  Physical Exam:   Gen: No distress  Neuro: Awake and Alert  Skin: Warm    Focused Exam: Lungs clear Heart rrr   Initiation of care has begun. The patient has been counseled on the process, plan, and necessity for staying for the completion/evaluation, and the remainder of the medical screening examination   Osie CheeksSofia, Vikas Wegmann K, PA-C 09/15/17 1641    Tegeler, Canary Brimhristopher J, MD 09/16/17 0111

## 2017-09-15 NOTE — Progress Notes (Signed)
MRN: [id number] DOB: 02-Jun-1924  Subjective:   [facility] PackerLonnie E Whitby Jr. is a 84 y.o. male presenting for acute onset of frank hematuria today as witnessed by a nursing staff member at the day care he goes to. Patient is incontinent, wears a urinary pad, was noted to have blood in the pad this morning. Has been incontinent for ~1 year. Has a urologist, started seeing them for urinary urgency, have monitored him for hematuria as well. He still has urgency, wife reports that he hardly ever urinates when he thinks he has to however. Therefore, he is wearing urinary pads. Denies fever, dysuria, urinary frequency, bloody stools, diarrhea, abdominal pain. Patient does have Alzheimer's disease, managed with donepezil. Patient's wife  reports that he quit smoking about 77 years ago. His smoking use included cigarettes. He has never used smokeless tobacco. He reports that he does not drink alcohol or use drugs.   [name] has a current medication list which includes the following prescription(s): amlodipine, donepezil, glycopyrrolate, metoprolol succinate, nitroglycerin, ramipril, risperidone, and sertraline. Also is allergic to shrimp [shellfish allergy].  [name]  has a past medical history of Amputation of great toe, left, traumatic (HCC), Arthritis, Asthma, Blind right eye, BPH (benign prostatic hypertrophy), Bruises easily, CAD (coronary artery disease), Claudication (HCC), Colitis, Dementia, Diabetes mellitus, Gastritis, Heart murmur, History of shingles (3-5336yrs ago), colonic polyps, Hyperlipidemia, Hypertension, Insomnia, Mental disorder, Palpitations, Peripheral vascular disease (HCC), and Urinary urgency. Also  has a past surgical history that includes Colonoscopy; Axillary-femoral Bypass Graft (05/05/2011); Femoral-popliteal Bypass Graft (05/05/2011); Tonsillectomy (1960); Aortic valve replacement (06/2003); Band hemorrhoidectomy (1960's); Ostectomy (09/2001); Cardiac catheterization; Coronary angioplasty; Coronary  angioplasty with stent (2000); Coronary artery bypass graft (1989/06/2003); Cardiac valve replacement (06/2003); Cataract extraction; Axillary-femoral Bypass Graft (06/10/2011); I&D extremity (07/04/2011); Amputation (07/04/2011); Cardiac catheterization (N/A, 08/04/2014); and Cardiac catheterization (Right, 08/04/2014). His family history includes Cancer in his mother; Heart disease in his father.   Objective:   Vitals: BP 115/65   Pulse 69   Temp 98.4 F (36.9 C) (Oral)   SpO2 97%   BP Readings from Last 3 Encounters:  09/15/17 115/65  09/06/17 (!) 112/58  06/06/17 (!) 123/98    Physical Exam  Constitutional: He is oriented to person, place, and time. He appears well-developed and well-nourished.  Eyes: No scleral icterus.  Cardiovascular: Normal rate, regular rhythm, normal heart sounds and intact distal pulses. Exam reveals no gallop and no friction rub.  No murmur heard. Pulmonary/Chest: Effort normal and breath sounds normal. No stridor. No respiratory distress. He has no wheezes. He has no rales.  Abdominal: Soft. Bowel sounds are normal. He exhibits no distension and no mass. There is no tenderness. There is no rebound and no guarding.  Neurological: He is alert and oriented to person, place, and time. Coordination (limited mobility, high risk for falls) abnormal.  Skin: Skin is warm and dry.  Psychiatric:  Patient is hard of hearing. Speech is slowed and has a difficult time understanding, needs to have instructions repeated multiple times. He is very pleasant, not aggressive or combative.    Results for orders placed or performed in visit on 09/15/17 (from the past 24 hour(s))  POCT urinalysis dipstick     Status: Abnormal   Collection Time: 09/15/17  2:43 PM  Result Value Ref Range   Color, UA red (A) yellow   Clarity, UA cloudy (A) clear   Glucose, UA negative negative mg/dL   Bilirubin, UA moderate (A) negative   Ketones, POC UA small (15) (  A) negative mg/dL   Spec Grav, UA  1.610 1.010 - 1.025   Blood, UA large (A) negative   pH, UA 6.0 5.0 - 8.0   Protein Ur, POC >=300 (A) negative mg/dL   Urobilinogen, UA 2.0 (A) 0.2 or 1.0 E.U./dL   Nitrite, UA Negative Negative   Leukocytes, UA Large (3+) (A) Negative  POCT Microscopic Urinalysis ([facility])     Status: Abnormal   Collection Time: 09/15/17  3:14 PM  Result Value Ref Range   WBC,UR,HPF,POC Too numerous to count  (A) None WBC/hpf   RBC,UR,HPF,POC Too numerous to count  (A) None RBC/hpf   Bacteria Few (A) None, Too numerous to count   Mucus Absent Absent   Epithelial Cells, UR Per Microscopy None None, Too numerous to count cells/hpf   Assessment and Plan :   Hematuria, unspecified type - Plan: POCT Microscopic Urinalysis ([facility]), POCT urinalysis dipstick, CANCELED: POCT CBC, CANCELED: Basic metabolic panel  CKD (chronic kidney disease), stage III (HCC)  Essential hypertension  Case precepted with Dr. Alvy Bimler. Patient was taken off of aspirin due to his frequent falls and risk for bleeding.  His wife is unclear on when he stopped this.  He is otherwise not on any anticoagulation.  Patient has frank bleeding with clots from the penis.  I redirected patient toward the ER so that the source of his bleeding could be found and stabilized.  Counseled patient's wife that he will likely need follow-up with his urologist but today needs an ER visit to make sure that he does not continue to bleed.  Patient's wife is in agreement with treatment plan.  Wallis Bamberg, PA-C [facility] at [facility] [phone] 09/15/2017  1:45 PM

## 2017-09-15 NOTE — ED Triage Notes (Signed)
Family reports pt having moderate amount of blood in urine since last night. Pt has reported recent back pain. No fever. No acute distress is noted at triage.

## 2017-09-15 NOTE — ED Notes (Signed)
Called for triage and there was no answer.

## 2017-09-15 NOTE — Patient Instructions (Addendum)
Please report to the ER now as your husband is having frank blood in his urine which may be a sign of infection, kidney damage or other urinary tract emergency.  Please go straight there and he will be evaluated by different medical team that can stabilize and find the source of his bleeding.  Hematuria, Adult Hematuria is blood in your urine. It can be caused by a bladder infection, kidney infection, prostate infection, kidney stone, or cancer of your urinary tract. Infections can usually be treated with medicine, and a kidney stone usually will pass through your urine. If neither of these is the cause of your hematuria, further workup to find out the reason may be needed. It is very important that you tell your health care provider about any blood you see in your urine, even if the blood stops without treatment or happens without causing pain. Blood in your urine that happens and then stops and then happens again can be a symptom of a very serious condition. Also, pain is not a symptom in the initial stages of many urinary cancers. Follow these instructions at home:  Drink lots of fluid, 3-4 quarts a day. If you have been diagnosed with an infection, cranberry juice is especially recommended, in addition to large amounts of water.  Avoid caffeine, tea, and carbonated beverages because they tend to irritate the bladder.  Avoid alcohol because it may irritate the prostate.  Take all medicines as directed by your health care provider.  If you were prescribed an antibiotic medicine, finish it all even if you start to feel better.  If you have been diagnosed with a kidney stone, follow your health care provider's instructions regarding straining your urine to catch the stone.  Empty your bladder often. Avoid holding urine for long periods of time.  After a bowel movement, women should cleanse front to back. Use each tissue only once.  Empty your bladder before and after sexual intercourse if you  are a male. Contact a health care provider if:  You develop back pain.  You have a fever.  You have a feeling of sickness in your stomach (nausea) or vomiting.  Your symptoms are not better in 3 days. Return sooner if you are getting worse. Get help right away if:  You develop severe vomiting and are unable to keep the medicine down.  You develop severe back or abdominal pain despite taking your medicines.  You begin passing a large amount of blood or clots in your urine.  You feel extremely weak or faint, or you pass out. This information is not intended to replace advice given to you by your health care provider. Make sure you discuss any questions you have with your health care provider. Document Released: 02/10/2005 Document Revised: 07/19/2015 Document Reviewed: 10/11/2012 Elsevier Interactive Patient Education  2017 [employer]     IF you received an x-ray today, you will receive an invoice from [facility]. Please contact [facility] at [phone] with questions or concerns regarding your invoice.   IF you received labwork today, you will receive an invoice from [facility]. Please contact [facility] at [phone] with questions or concerns regarding your invoice.   Our billing staff will not be able to assist you with questions regarding bills from these companies.  You will be contacted with the lab results as soon as they are available. The fastest way to get your results is to activate your My Chart account. Instructions are located on the last page of  this paperwork. If you have not heard from [place] regarding the results in 2 weeks, please contact this office.

## 2017-09-16 NOTE — ED Provider Notes (Signed)
[facility] EMERGENCY DEPARTMENT Provider Note   CSN: [id number] Arrival date & time: 09/15/17  1539     History   Chief Complaint Chief Complaint  Patient presents with  . Hematuria    HPI [name]. is a 84 y.o. male.  HPI  84 year old male comes in with chief complaint of bloody urine. Patient has history of BPH, CAD, diabetes and dementia.  He is not on any blood thinners.  According to family they started noticing that patient had a grossly bloody urine, without any clots passing.  Patient went to the PCP, who recommended that he come to the ER.  There is no history of UTIs in the past, and patient has not had any fevers or chills, nausea or vomiting.  Patient has no complaints from his side.  Level 5 caveat for severe dementia.  Past Medical History:  Diagnosis Date  . Amputation of great toe, left, traumatic (HCC)   . Arthritis   . Asthma    as a young child  . Blind right eye    "since age 61-4; S/P whooping cough"  . BPH (benign prostatic hypertrophy)    Dr.Nesi is urologist  . Bruises easily    takes Pletal and ASA daily  . CAD (coronary artery disease)   . Claudication (HCC)   . Colitis    hx of  . Dementia   . Diabetes mellitus    borderline diabetic  . Gastritis   . Heart murmur   . History of shingles 3-34yrs ago  . Hx of colonic polyps   . Hyperlipidemia    takes Atorvastatin daily  . Hypertension    takes Ramipril and Metoprolol daily  . Insomnia    takes Ambien nightly  . Mental disorder    takes Aricept daily  . Palpitations    we need to exclude atrial fib and ventricular arrhythmia.  . Peripheral vascular disease (HCC)   . Urinary urgency    takes Flomax daily    Patient Active Problem List   Diagnosis Date Noted  . CAD (coronary artery disease) of artery bypass graft 11/24/2012    Class: Chronic  . S/P aortic valve replacement with bioprosthetic valve 11/24/2012    Class: Chronic  . Amputation, toe,  traumatic (HCC) 07/04/2011  . Hematoma of groin 06/19/2011  . Atherosclerosis of native arteries of the extremities with ulceration(440.23) 06/19/2011  . Other complications due to other vascular device, implant, and graft 06/19/2011  . PVD (peripheral vascular disease) with claudication (HCC) 06/05/2011    Past Surgical History:  Procedure Laterality Date  . AMPUTATION  07/04/2011   Procedure: AMPUTATION FOOT;  Surgeon: Loanne Drilling, MD;  Location: [facility] OR;  Service: Orthopedics;  Laterality: Left;  . AORTIC VALVE REPLACEMENT  06/2003   /E-chart  . AXILLARY-FEMORAL BYPASS GRAFT  05/05/2011   Procedure: BYPASS GRAFT AXILLA-BIFEMORAL;  Surgeon: Sherren Kerns, MD;  Location: [facility] OR;  Service: Vascular;  Laterality: Right;  . AXILLARY-FEMORAL BYPASS GRAFT  06/10/2011   Procedure: BYPASS GRAFT AXILLA-BIFEMORAL;  Surgeon: Sherren Kerns, MD;  Location: [facility] OR;  Service: Vascular;  Laterality: Right;  REVISION Right axillary bifemoral bypass using a 8mm x 80 cm Propaten Graft  . BAND HEMORRHOIDECTOMY  1960's  . CARDIAC CATHETERIZATION     multiple-see epic  . CARDIAC VALVE REPLACEMENT  06/2003  . CATARACT EXTRACTION     left eye  . COLONOSCOPY    . CORONARY ANGIOPLASTY  right leg  . CORONARY ANGIOPLASTY WITH STENT PLACEMENT  2000   /E-chart  . CORONARY ARTERY BYPASS GRAFT  1989/06/2003   CABG X 5; CABG X1  . FEMORAL-POPLITEAL BYPASS GRAFT  05/05/2011   Procedure: BYPASS GRAFT FEMORAL-POPLITEAL ARTERY;  Surgeon: Sherren Kernsharles E Fields, MD;  [facility];  Service: Vascular;  Laterality: Right;  . I&D EXTREMITY  07/04/2011   Procedure: IRRIGATION AND DEBRIDEMENT EXTREMITY;  Surgeon: Loanne DrillingFrank V Aluisio, MD;  Location: [facility] OR;  Service: Orthopedics;  Laterality: Left;  . OSTECTOMY  09/2001   right hip/E-chart  . PERIPHERAL VASCULAR CATHETERIZATION N/A 08/04/2014   Procedure: Aortic Arch Angiography;  Surgeon: Sherren Kernsharles E Fields, MD;  [facility];  Service: Cardiovascular;  Laterality:  N/A;  . PERIPHERAL VASCULAR CATHETERIZATION Right 08/04/2014   Procedure: Upper Extremity Angiography;  Surgeon: Sherren Kernsharles E Fields, MD;  [facility];  Service: Cardiovascular;  Laterality: Right;  . TONSILLECTOMY  1960        Home Medications    Prior to Admission medications   Medication Sig Start Date End Date Taking? Authorizing Provider  amLODipine (NORVASC) 5 MG tablet Take 5 mg by mouth daily.  08/26/17  Yes [provider]  donepezil (ARICEPT) 10 MG tablet Take 10 mg by mouth daily. 07/27/17  Yes [provider]  glycopyrrolate (ROBINUL) 2 MG tablet Take 2 mg by mouth 3 (three) times daily. 08/26/17  Yes [provider]  metoprolol succinate (TOPROL-XL) 50 MG 24 hr tablet Take 25 mg by mouth 2 (two) times daily. 08/12/17  Yes [provider]  nitroGLYCERIN (NITROSTAT) 0.4 MG SL tablet Place 0.4 mg under the tongue every 5 (five) minutes as needed. For chest pain   Yes [provider]  ramipril (ALTACE) 5 MG capsule Take 5 mg by mouth daily. 08/12/17  Yes [provider]  risperiDONE (RISPERDAL) 0.5 MG tablet Take 0.5 mg by mouth at bedtime.   Yes [provider]  sertraline (ZOLOFT) 25 MG tablet TAKE 25 MG BY MOUTH DAILY 11/19/16  Yes [provider]    Family History Family History  Problem Relation Age of Onset  . Heart disease Father   . Cancer Mother   . Anesthesia problems Neg Hx   . Hypotension Neg Hx   . Malignant hyperthermia Neg Hx   . Pseudochol deficiency Neg Hx     Social History Social History   Tobacco Use  . Smoking status: Former Smoker    Types: Cigarettes    Last attempt to quit: 02/25/1940    Years since quitting: 77.6  . Smokeless tobacco: Never Used  Substance Use Topics  . Alcohol use: No    Comment: "last alcohol was ~ 1950's"  . Drug use: No     Allergies   Shrimp [shellfish allergy]   Review of Systems Review of Systems  Unable to perform ROS: Dementia      Physical Exam Updated Vital Signs BP (!) 154/61 (BP Location: Left Arm)   Pulse (!) 59   Temp 98.4 F (36.9 C) (Oral)   Resp 16   Ht 5\' 2"  (1.575 m)   Wt 65.8 kg (145 lb)   SpO2 99%   BMI 26.52 kg/m   Physical Exam  Constitutional: He appears well-developed.  HENT:  Head: Atraumatic.  Neck: Neck supple.  Cardiovascular: Normal rate.  Pulmonary/Chest: Effort normal.  Abdominal: Soft. There is no tenderness.  Genitourinary:  Genitourinary Comments: Patient already has a Foley catheter in  place, and the Foley showing gross hematuria in form of pink urine.  There is no clots in the catheter and no large sediments  Neurological: He is alert.  Skin: Skin is warm.  Nursing note and vitals reviewed.    ED Treatments / Results  Labs (all labs ordered are listed, but only abnormal results are displayed) Labs Reviewed  URINALYSIS, ROUTINE W REFLEX MICROSCOPIC - Abnormal; Notable for the following components:      Result Value   APPearance HAZY (*)    Hgb urine dipstick LARGE (*)    RBC / HPF >50 (*)    All other components within normal limits  BASIC METABOLIC PANEL - Abnormal; Notable for the following components:   Glucose, Bld 105 (*)    BUN 31 (*)    Creatinine, Ser 1.42 (*)    GFR calc non Af Amer 42 (*)    GFR calc Af Amer 49 (*)    All other components within normal limits  CBC - Abnormal; Notable for the following components:   WBC 3.7 (*)    RBC 3.54 (*)    Hemoglobin 10.6 (*)    HCT 33.4 (*)    Platelets 99 (*)    All other components within normal limits  URINE CULTURE    EKG None  Radiology No results found.  Procedures Procedures (including critical care time)  Medications Ordered in ED Medications - No data to display   Initial Impression / Assessment and Plan / ED Course  I have reviewed the triage vital signs and the nursing notes.  Pertinent labs & imaging results that were available during my care of the patient were reviewed by  me and considered in my medical decision making (see chart for details).     Pt comes in with cc of bloody urine. His exam is unremarkable. Pt's vitals signs are WNL and he is non toxic. UA is not concerning for infection - cultures sent since it is a clean sample.  Since there are no large clots and patient did not have urinary retention, we have decided to remove the Foley catheter.  Family has been advised to follow-up with urology, as it appears that PCP also wants the same.  Strict ER return precautions have been discussed with the patient and the family, and they are comfortable with the plan.  Final Clinical Impressions(s) / ED Diagnoses   Final diagnoses:  Gross hematuria    ED Discharge Orders    None       Derwood Kaplan, MD 09/16/17 0100

## 2017-09-17 LAB — URINE CULTURE
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
## 2018-01-18 ENCOUNTER — Encounter: Payer: Self-pay | Admitting: Interventional Cardiology

## 2018-01-18 ENCOUNTER — Ambulatory Visit (INDEPENDENT_AMBULATORY_CARE_PROVIDER_SITE_OTHER): Payer: Medicare Other | Admitting: Interventional Cardiology

## 2018-01-18 VITALS — BP 108/70 | HR 81 | Ht 62.0 in | Wt 120.0 lb

## 2018-01-18 DIAGNOSIS — F0280 Dementia in other diseases classified elsewhere without behavioral disturbance: Secondary | ICD-10-CM

## 2018-01-18 DIAGNOSIS — G301 Alzheimer's disease with late onset: Secondary | ICD-10-CM

## 2018-01-18 DIAGNOSIS — Z953 Presence of xenogenic heart valve: Secondary | ICD-10-CM

## 2018-01-18 DIAGNOSIS — I739 Peripheral vascular disease, unspecified: Secondary | ICD-10-CM

## 2018-01-18 DIAGNOSIS — R600 Localized edema: Secondary | ICD-10-CM

## 2018-01-18 DIAGNOSIS — I25810 Atherosclerosis of coronary artery bypass graft(s) without angina pectoris: Secondary | ICD-10-CM | POA: Diagnosis not present

## 2018-01-18 MED ORDER — NITROGLYCERIN 0.4 MG SL SUBL: 0.4000 mg | SUBLINGUAL_TABLET | SUBLINGUAL | 4 refills | Status: DC | PRN

## 2018-01-18 NOTE — Patient Instructions (Signed)
Medication Instructions:  Your physician recommends that you continue on your current medications as directed. Please refer to the Current Medication list given to you today.  If you need a refill on your cardiac medications before your next appointment, please call your pharmacy.   Lab work: None ordered  If you have labs (blood work) drawn today and your tests are completely normal, you will receive your results only by: . MyChart Message (if you have MyChart) OR . A paper copy in the mail If you have any lab test that is abnormal or we need to change your treatment, we will call you to review the results.  Testing/Procedures: None ordered   Follow-Up: . AS NEEDED  Any Other Special Instructions Will Be Listed Below (If Applicable).     

## 2018-01-24 ENCOUNTER — Emergency Department (HOSPITAL_COMMUNITY): Payer: Medicare Other

## 2018-01-24 ENCOUNTER — Emergency Department (HOSPITAL_COMMUNITY)
Admission: EM | Admit: 2018-01-24 | Discharge: 2018-01-24 | Disposition: A | Discharge status: Home / Self Care | Payer: Medicare Other | Attending: Emergency Medicine | Admitting: Emergency Medicine

## 2018-01-24 ENCOUNTER — Encounter (HOSPITAL_COMMUNITY): Payer: Self-pay | Admitting: Emergency Medicine

## 2018-01-24 ENCOUNTER — Emergency Department (HOSPITAL_BASED_OUTPATIENT_CLINIC_OR_DEPARTMENT_OTHER): Payer: Medicare Other

## 2018-01-24 DIAGNOSIS — I2510 Atherosclerotic heart disease of native coronary artery without angina pectoris: Secondary | ICD-10-CM | POA: Insufficient documentation

## 2018-01-24 DIAGNOSIS — M7989 Other specified soft tissue disorders: Secondary | ICD-10-CM | POA: Diagnosis not present

## 2018-01-24 DIAGNOSIS — F0390 Unspecified dementia without behavioral disturbance: Secondary | ICD-10-CM | POA: Insufficient documentation

## 2018-01-24 DIAGNOSIS — E785 Hyperlipidemia, unspecified: Secondary | ICD-10-CM | POA: Insufficient documentation

## 2018-01-24 DIAGNOSIS — N183 Chronic kidney disease, stage 3 unspecified: Secondary | ICD-10-CM

## 2018-01-24 DIAGNOSIS — E1122 Type 2 diabetes mellitus with diabetic chronic kidney disease: Secondary | ICD-10-CM | POA: Diagnosis not present

## 2018-01-24 DIAGNOSIS — D649 Anemia, unspecified: Secondary | ICD-10-CM | POA: Diagnosis not present

## 2018-01-24 DIAGNOSIS — J45909 Unspecified asthma, uncomplicated: Secondary | ICD-10-CM | POA: Insufficient documentation

## 2018-01-24 DIAGNOSIS — M79605 Pain in left leg: Secondary | ICD-10-CM | POA: Insufficient documentation

## 2018-01-24 DIAGNOSIS — M79604 Pain in right leg: Secondary | ICD-10-CM | POA: Insufficient documentation

## 2018-01-24 DIAGNOSIS — I129 Hypertensive chronic kidney disease with stage 1 through stage 4 chronic kidney disease, or unspecified chronic kidney disease: Secondary | ICD-10-CM | POA: Insufficient documentation

## 2018-01-24 DIAGNOSIS — D696 Thrombocytopenia, unspecified: Secondary | ICD-10-CM | POA: Insufficient documentation

## 2018-01-24 DIAGNOSIS — Z79899 Other long term (current) drug therapy: Secondary | ICD-10-CM | POA: Insufficient documentation

## 2018-01-24 DIAGNOSIS — Z87891 Personal history of nicotine dependence: Secondary | ICD-10-CM | POA: Insufficient documentation

## 2018-01-24 LAB — CBC WITH DIFFERENTIAL/PLATELET
ABS IMMATURE GRANULOCYTES: 0.01 10*3/uL (ref 0.00–0.07)
BASOS ABS: 0 10*3/uL (ref 0.0–0.1)
Basophils Relative: 0 %
Eosinophils Absolute: 0.2 10*3/uL (ref 0.0–0.5)
Eosinophils Relative: 6 %
HEMATOCRIT: 34.4 % — AB (ref 39.0–52.0)
Hemoglobin: 10.6 g/dL — ABNORMAL LOW (ref 13.0–17.0)
IMMATURE GRANULOCYTES: 0 %
LYMPHS ABS: 0.8 10*3/uL (ref 0.7–4.0)
Lymphocytes Relative: 21 %
MCH: 28.6 pg (ref 26.0–34.0)
MCHC: 30.8 g/dL (ref 30.0–36.0)
MCV: 93 fL (ref 80.0–100.0)
Monocytes Absolute: 0.6 10*3/uL (ref 0.1–1.0)
Monocytes Relative: 16 %
NEUTROS PCT: 57 %
NRBC: 0 % (ref 0.0–0.2)
Neutro Abs: 2 10*3/uL (ref 1.7–7.7)
Platelets: 97 10*3/uL — ABNORMAL LOW (ref 150–400)
RBC: 3.7 MIL/uL — ABNORMAL LOW (ref 4.22–5.81)
RDW: 14.4 % (ref 11.5–15.5)
WBC: 3.6 10*3/uL — ABNORMAL LOW (ref 4.0–10.5)

## 2018-01-24 LAB — URINALYSIS, ROUTINE W REFLEX MICROSCOPIC
Bacteria, UA: NONE SEEN
Bilirubin Urine: NEGATIVE
GLUCOSE, UA: NEGATIVE mg/dL
Hgb urine dipstick: NEGATIVE
Ketones, ur: NEGATIVE mg/dL
Leukocytes, UA: NEGATIVE
Nitrite: NEGATIVE
PROTEIN: NEGATIVE mg/dL
Specific Gravity, Urine: 1.015 (ref 1.005–1.030)
pH: 5 (ref 5.0–8.0)

## 2018-01-24 LAB — COMPREHENSIVE METABOLIC PANEL
ALBUMIN: 3.4 g/dL — AB (ref 3.5–5.0)
ALK PHOS: 76 U/L (ref 38–126)
ALT: 15 U/L (ref 0–44)
AST: 21 U/L (ref 15–41)
Anion gap: 9 (ref 5–15)
BILIRUBIN TOTAL: 0.5 mg/dL (ref 0.3–1.2)
BUN: 34 mg/dL — AB (ref 8–23)
CO2: 27 mmol/L (ref 22–32)
Calcium: 8.8 mg/dL — ABNORMAL LOW (ref 8.9–10.3)
Chloride: 101 mmol/L (ref 98–111)
Creatinine, Ser: 1.68 mg/dL — ABNORMAL HIGH (ref 0.61–1.24)
GFR calc Af Amer: 41 mL/min — ABNORMAL LOW (ref >60)
GFR calc non Af Amer: 35 mL/min — ABNORMAL LOW (ref >60)
Glucose, Bld: 124 mg/dL — ABNORMAL HIGH (ref 70–99)
POTASSIUM: 4.3 mmol/L (ref 3.5–5.1)
Sodium: 137 mmol/L (ref 135–145)
TOTAL PROTEIN: 6.6 g/dL (ref 6.5–8.1)

## 2018-01-24 MED ORDER — SODIUM CHLORIDE 0.9 % IV BOLUS: 1000.0000 mL | Freq: Once | INTRAVENOUS | Status: DC

## 2018-01-24 NOTE — ED Notes (Signed)
The pt has voided yellow colored urine

## 2018-01-24 NOTE — ED Notes (Signed)
Room full of people  Will return

## 2018-01-24 NOTE — Discharge Instructions (Addendum)
Tylenol as needed for pain.

## 2018-01-24 NOTE — ED Triage Notes (Signed)
Pt/Cg came to ER with c/o RLE leg pain and CG states that the patient continued to yell "Help Me." Pt has hx of Dementia

## 2018-01-24 NOTE — ED Notes (Signed)
Both feet cold  Pulses dopplered in both feet  Lt foot pulse weaker in the lt  Family reports that the pt usually walks with a walker

## 2018-01-24 NOTE — ED Provider Notes (Signed)
[facility] EMERGENCY DEPARTMENT Provider Note   CSN: [id number] Arrival date & time: 01/24/18  1337     History   Chief Complaint Chief Complaint  Patient presents with  . Leg Pain    HPI Umer Harig. is a 84 y.o. male.  Pt presents to the ED today with leg weakness.  The pt has dementia and lives at home with his family.  He is normally able to transfer from the wheelchair to the bed, but he was unable to do this today.  His family thinks he was hurting in his leg.  The pt has not had any fevers.  He did fall at adult day care this week, but did not seem to hurt anything.  Pt unable to contribute to hx due to dementia.     Past Medical History:  Diagnosis Date  . Amputation of great toe, left, traumatic (HCC)   . Arthritis   . Asthma    as a young child  . Blind right eye    "since age 24-4; S/P whooping cough"  . BPH (benign prostatic hypertrophy)    Dr.Nesi is urologist  . Bruises easily    takes Pletal and ASA daily  . CAD (coronary artery disease)   . Claudication (HCC)   . Colitis    hx of  . Dementia   . Diabetes mellitus    borderline diabetic  . Gastritis   . Heart murmur   . History of shingles 3-102yrs ago  . Hx of colonic polyps   . Hyperlipidemia    takes Atorvastatin daily  . Hypertension    takes Ramipril and Metoprolol daily  . Insomnia    takes Ambien nightly  . Mental disorder    takes Aricept daily  . Palpitations    we need to exclude atrial fib and ventricular arrhythmia.  . Peripheral vascular disease (HCC)   . Urinary urgency    takes Flomax daily    Patient Active Problem List   Diagnosis Date Noted  . CAD (coronary artery disease) of artery bypass graft 11/24/2012    Class: Chronic  . S/P aortic valve replacement with bioprosthetic valve 11/24/2012    Class: Chronic  . Amputation, toe, traumatic (HCC) 07/04/2011  . Hematoma of groin 06/19/2011  . Atherosclerosis of native arteries of the extremities  with ulceration(440.23) 06/19/2011  . Other complications due to other vascular device, implant, and graft 06/19/2011  . PVD (peripheral vascular disease) with claudication (HCC) 06/05/2011    Past Surgical History:  Procedure Laterality Date  . AMPUTATION  07/04/2011   Procedure: AMPUTATION FOOT;  Surgeon: Loanne Drilling, MD;  Location: [facility] OR;  Service: Orthopedics;  Laterality: Left;  . AORTIC VALVE REPLACEMENT  06/2003   /E-chart  . AXILLARY-FEMORAL BYPASS GRAFT  05/05/2011   Procedure: BYPASS GRAFT AXILLA-BIFEMORAL;  Surgeon: Sherren Kerns, MD;  Location: [facility] OR;  Service: Vascular;  Laterality: Right;  . AXILLARY-FEMORAL BYPASS GRAFT  06/10/2011   Procedure: BYPASS GRAFT AXILLA-BIFEMORAL;  Surgeon: Sherren Kerns, MD;  Location: [facility] OR;  Service: Vascular;  Laterality: Right;  REVISION Right axillary bifemoral bypass using a 8mm x 80 cm Propaten Graft  . BAND HEMORRHOIDECTOMY  1960's  . CARDIAC CATHETERIZATION     multiple-see epic  . CARDIAC VALVE REPLACEMENT  06/2003  . CATARACT EXTRACTION     left eye  . COLONOSCOPY    . CORONARY ANGIOPLASTY     right leg  . CORONARY ANGIOPLASTY  WITH STENT PLACEMENT  2000   /E-chart  . CORONARY ARTERY BYPASS GRAFT  1989/06/2003   CABG X 5; CABG X1  . FEMORAL-POPLITEAL BYPASS GRAFT  05/05/2011   Procedure: BYPASS GRAFT FEMORAL-POPLITEAL ARTERY;  Surgeon: Sherren Kernsharles E Fields, MD;  [facility];  Service: Vascular;  Laterality: Right;  . I&D EXTREMITY  07/04/2011   Procedure: IRRIGATION AND DEBRIDEMENT EXTREMITY;  Surgeon: Loanne DrillingFrank V Aluisio, MD;  Location: [facility] OR;  Service: Orthopedics;  Laterality: Left;  . OSTECTOMY  09/2001   right hip/E-chart  . PERIPHERAL VASCULAR CATHETERIZATION N/A 08/04/2014   Procedure: Aortic Arch Angiography;  Surgeon: Sherren Kernsharles E Fields, MD;  [facility];  Service: Cardiovascular;  Laterality: N/A;  . PERIPHERAL VASCULAR CATHETERIZATION Right 08/04/2014   Procedure: Upper Extremity Angiography;  Surgeon:  Sherren Kernsharles E Fields, MD;  [facility];  Service: Cardiovascular;  Laterality: Right;  . TONSILLECTOMY  1960        Home Medications    Prior to Admission medications   Medication Sig Start Date End Date Taking? Authorizing Provider  acetaminophen (TYLENOL) 500 MG tablet Take 500 mg by mouth every 6 (six) hours as needed.   Yes [provider]  amLODipine (NORVASC) 5 MG tablet Take 5 mg by mouth daily.  08/26/17  Yes [provider]  finasteride (PROSCAR) 5 MG tablet Take 1 tablet by mouth daily. 01/05/18  Yes [provider]  furosemide (LASIX) 20 MG tablet Take 1 tablet by mouth daily as needed for edema.  01/06/18  Yes [provider]  glycopyrrolate (ROBINUL) 2 MG tablet Take 2 mg by mouth 3 (three) times daily. 08/26/17  Yes [provider]  metoprolol succinate (TOPROL-XL) 50 MG 24 hr tablet Take 25 mg by mouth 2 (two) times daily. 08/12/17  Yes [provider]  nitroGLYCERIN (NITROSTAT) 0.4 MG SL tablet Place 1 tablet (0.4 mg total) under the tongue every 5 (five) minutes as needed. For chest pain 01/18/18  Yes Lyn RecordsSmith, Henry W, MD  ramipril (ALTACE) 5 MG capsule Take 5 mg by mouth daily. 08/12/17  Yes [provider]  risperiDONE (RISPERDAL) 0.5 MG tablet Take 0.5-1 mg by mouth See admin instructions. Take 1 tablet by mouth every morning and 2 tabs every night at bedtime   Yes [provider]  sertraline (ZOLOFT) 25 MG tablet Take 25 mg by mouth daily.  11/19/16  Yes [provider]    Family History Family History  Problem Relation Age of Onset  . Heart disease Father   . Cancer Mother   . Anesthesia problems Neg Hx   . Hypotension Neg Hx   . Malignant hyperthermia Neg Hx   . Pseudochol deficiency Neg Hx     Social History Social History   Tobacco Use  . Smoking status: Former Smoker    Types: Cigarettes    Last attempt to quit: 02/25/1940    Years since quitting: 77.9  . Smokeless  tobacco: Never Used  Substance Use Topics  . Alcohol use: No    Comment: "last alcohol was ~ 1950's"  . Drug use: No     Allergies   Shrimp [shellfish allergy]   Review of Systems Review of Systems  Unable to perform ROS: Dementia  All other systems reviewed and are negative.    Physical Exam Updated Vital Signs BP 104/76   Pulse 74   Temp (!) 96.6 F (35.9 C) (Rectal)   Resp 19   SpO2 100%  Physical Exam  Constitutional: He appears well-developed and well-nourished.  HENT:  Head: Normocephalic and atraumatic.  Right Ear: External ear normal.  Left Ear: External ear normal.  Nose: Nose normal.  Mouth/Throat: Oropharynx is clear and moist.  Eyes:  Right eye blind  Neck: Normal range of motion. Neck supple.  Cardiovascular: Normal rate, regular rhythm, normal heart sounds and intact distal pulses.  Pulmonary/Chest: Effort normal and breath sounds normal.  Abdominal: Soft. Bowel sounds are normal.  Musculoskeletal: Normal range of motion. He exhibits edema.  Neurological: He is alert.  Pt is not following commands.  He is not talking right now.  Family said he is like this often.  Skin: Skin is warm. Capillary refill takes less than 2 seconds.  Nursing note and vitals reviewed.    ED Treatments / Results  Labs (all labs ordered are listed, but only abnormal results are displayed) Labs Reviewed  CBC WITH DIFFERENTIAL/PLATELET - Abnormal; Notable for the following components:      Result Value   WBC 3.6 (*)    RBC 3.70 (*)    Hemoglobin 10.6 (*)    HCT 34.4 (*)    Platelets 97 (*)    All other components within normal limits  COMPREHENSIVE METABOLIC PANEL - Abnormal; Notable for the following components:   Glucose, Bld 124 (*)    BUN 34 (*)    Creatinine, Ser 1.68 (*)    Calcium 8.8 (*)    Albumin 3.4 (*)    GFR calc non Af Amer 35 (*)    GFR calc Af Amer 41 (*)    All other components within normal limits  CULTURE, BLOOD (ROUTINE X 2)  CULTURE,  BLOOD (ROUTINE X 2)  URINE CULTURE  URINALYSIS, ROUTINE W REFLEX MICROSCOPIC    EKG None  Radiology Dg Pelvis 1-2 Views  Result Date: 01/24/2018 CLINICAL DATA:  Demented patient in distress. Unknown where the patient suffered trauma. EXAM: PELVIS - 1-2 VIEW COMPARISON:  None. FINDINGS: There is no evidence of pelvic fracture or diastasis. No pelvic bone lesions are seen. IMPRESSION: No acute abnormality. Electronically Signed   By: Drusilla Kanner M.D.   On: 01/24/2018 16:50   Dg Ankle Complete Left  Result Date: 01/24/2018 CLINICAL DATA:  Right ankle pain.  Dementia. EXAM: LEFT ANKLE COMPLETE - 3+ VIEW COMPARISON:  None. FINDINGS: Ankle mortise is normal. No evidence of acute fracture or dislocation. Several surgical clips over the medial soft tissues of the lower leg and ankle. Evidence of previous amputation first transmetatarsal. IMPRESSION: No acute findings. Electronically Signed   By: Elberta Fortis M.D.   On: 01/24/2018 16:42   Dg Ankle Complete Right  Result Date: 01/24/2018 CLINICAL DATA:  Right ankle pain.  Dementia. EXAM: RIGHT ANKLE - COMPLETE 3+ VIEW COMPARISON:  None. FINDINGS: Ankle mortise is normal. There are surgical clips over the medial soft tissues of the ankle. No acute fracture or dislocation. Small inferior calcaneal spur. IMPRESSION: No acute findings. Electronically Signed   By: Elberta Fortis M.D.   On: 01/24/2018 16:43   Ct Head Wo Contrast  Result Date: 01/24/2018 CLINICAL DATA:  Altered level of consciousness. Dementia. No reported injury. EXAM: CT HEAD WITHOUT CONTRAST TECHNIQUE: Contiguous axial images were obtained from the base of the skull through the vertex without intravenous contrast. COMPARISON:  03/30/2017 head CT. FINDINGS: Brain: Small left thalamic lacune without mass-effect, which appears new since 03/30/2017 head CT. No evidence of parenchymal hemorrhage or extra-axial fluid collection. No mass lesion,  mass effect, or midline shift. No CT evidence  of acute transcortical infarction. Generalized cerebral volume loss. Nonspecific prominent subcortical and periventricular white matter hypodensity, most in keeping with chronic small vessel ischemic change. Cerebral ventricle sizes are stable and concordant with the degree of cerebral volume loss. Vascular: No acute abnormality. Skull: No evidence of calvarial fracture. Sinuses/Orbits: The visualized paranasal sinuses are essentially clear. Stable dense calcification and relative collapse of the right globe. Other:  The mastoid air cells are unopacified. IMPRESSION: 1. Small left thalamic lacune without mass-effect, of uncertain chronicity, new since 03/30/2017 head CT, possibly acute. 2. Otherwise no evidence of potential acute intracranial abnormality. 3. Generalized cerebral volume loss and prominent chronic small vessel ischemic changes in the cerebral white matter. Electronically Signed   By: Delbert Phenix M.D.   On: 01/24/2018 15:29    Procedures Procedures (including critical care time)  Medications Ordered in ED Medications  sodium chloride 0.9 % bolus 1,000 mL (1,000 mLs Intravenous Not Given 01/24/18 1819)     Initial Impression / Assessment and Plan / ED Course  I have reviewed the triage vital signs and the nursing notes.  Pertinent labs & imaging results that were available during my care of the patient were reviewed by me and considered in my medical decision making (see chart for details).    Pt is at his normal baseline now.  He is able to ambulate with a walker.  He has no evidence of fracture or infection.  There were some BP readings that were low; however, when I repositioned the bp cuff and retook it, bp was ok.  Infarct seen on CT likely old.  Pt is moving and talking.    Pt's anemia and thrombocytopenia and CKD are all chronic.  Pt is stable for d/c.  Final Clinical Impressions(s) / ED Diagnoses   Final diagnoses:  Pain in both lower extremities  Thrombocytopenia  (HCC)  Chronic anemia  CKD (chronic kidney disease) stage 3, GFR 30-59 ml/min [facility])    ED Discharge Orders    None       Jacalyn Lefevre, MD 01/24/18 1930

## 2018-01-24 NOTE — Progress Notes (Signed)
VASCULAR LAB PRELIMINARY  PRELIMINARY  PRELIMINARY  PRELIMINARY  Bilateral lower extremity venous duplex completed.    Preliminary report:  There is no obvious evidence of DVT or SVT noted in the visualized veins of the bilateral lower extremities.   Nadim Malia, RVT 01/24/2018, 6:29 PM

## 2018-01-25 LAB — URINE CULTURE: Culture: NO GROWTH

## 2018-01-29 LAB — CULTURE, BLOOD (ROUTINE X 2)
CULTURE: NO GROWTH
Culture: NO GROWTH
SPECIAL REQUESTS: ADEQUATE
SPECIAL REQUESTS: ADEQUATE

## 2018-04-24 ENCOUNTER — Encounter (HOSPITAL_COMMUNITY): Payer: Self-pay | Admitting: Emergency Medicine

## 2018-04-24 ENCOUNTER — Emergency Department (HOSPITAL_COMMUNITY): Payer: Medicare Other

## 2018-04-24 ENCOUNTER — Inpatient Hospital Stay (HOSPITAL_COMMUNITY)
Admission: EM | Admit: 2018-04-24 | Discharge: 2018-04-28 | DRG: 281 | Disposition: A | Discharge status: Hospice | Payer: Medicare Other | Attending: Internal Medicine | Admitting: Internal Medicine

## 2018-04-24 ENCOUNTER — Other Ambulatory Visit: Payer: Self-pay

## 2018-04-24 DIAGNOSIS — Z87891 Personal history of nicotine dependence: Secondary | ICD-10-CM | POA: Diagnosis not present

## 2018-04-24 DIAGNOSIS — R931 Abnormal findings on diagnostic imaging of heart and coronary circulation: Secondary | ICD-10-CM | POA: Diagnosis not present

## 2018-04-24 DIAGNOSIS — Z953 Presence of xenogenic heart valve: Secondary | ICD-10-CM

## 2018-04-24 DIAGNOSIS — I25810 Atherosclerosis of coronary artery bypass graft(s) without angina pectoris: Secondary | ICD-10-CM | POA: Diagnosis present

## 2018-04-24 DIAGNOSIS — I4891 Unspecified atrial fibrillation: Secondary | ICD-10-CM | POA: Diagnosis present

## 2018-04-24 DIAGNOSIS — R3915 Urgency of urination: Secondary | ICD-10-CM | POA: Diagnosis present

## 2018-04-24 DIAGNOSIS — I10 Essential (primary) hypertension: Secondary | ICD-10-CM | POA: Diagnosis present

## 2018-04-24 DIAGNOSIS — R000 Tachycardia, unspecified: Secondary | ICD-10-CM | POA: Diagnosis not present

## 2018-04-24 DIAGNOSIS — Z6822 Body mass index (BMI) 22.0-22.9, adult: Secondary | ICD-10-CM

## 2018-04-24 DIAGNOSIS — E875 Hyperkalemia: Secondary | ICD-10-CM | POA: Diagnosis present

## 2018-04-24 DIAGNOSIS — Z66 Do not resuscitate: Secondary | ICD-10-CM | POA: Diagnosis not present

## 2018-04-24 DIAGNOSIS — Z809 Family history of malignant neoplasm, unspecified: Secondary | ICD-10-CM | POA: Diagnosis not present

## 2018-04-24 DIAGNOSIS — Z951 Presence of aortocoronary bypass graft: Secondary | ICD-10-CM

## 2018-04-24 DIAGNOSIS — F0390 Unspecified dementia without behavioral disturbance: Secondary | ICD-10-CM | POA: Diagnosis present

## 2018-04-24 DIAGNOSIS — R627 Adult failure to thrive: Secondary | ICD-10-CM | POA: Diagnosis not present

## 2018-04-24 DIAGNOSIS — Z993 Dependence on wheelchair: Secondary | ICD-10-CM | POA: Diagnosis not present

## 2018-04-24 DIAGNOSIS — E1151 Type 2 diabetes mellitus with diabetic peripheral angiopathy without gangrene: Secondary | ICD-10-CM | POA: Diagnosis not present

## 2018-04-24 DIAGNOSIS — I361 Nonrheumatic tricuspid (valve) insufficiency: Secondary | ICD-10-CM | POA: Diagnosis not present

## 2018-04-24 DIAGNOSIS — Z8249 Family history of ischemic heart disease and other diseases of the circulatory system: Secondary | ICD-10-CM | POA: Diagnosis not present

## 2018-04-24 DIAGNOSIS — H5461 Unqualified visual loss, right eye, normal vision left eye: Secondary | ICD-10-CM | POA: Diagnosis not present

## 2018-04-24 DIAGNOSIS — Z7189 Other specified counseling: Secondary | ICD-10-CM | POA: Diagnosis not present

## 2018-04-24 DIAGNOSIS — G4700 Insomnia, unspecified: Secondary | ICD-10-CM | POA: Diagnosis present

## 2018-04-24 DIAGNOSIS — I213 ST elevation (STEMI) myocardial infarction of unspecified site: Secondary | ICD-10-CM

## 2018-04-24 DIAGNOSIS — I2510 Atherosclerotic heart disease of native coronary artery without angina pectoris: Secondary | ICD-10-CM | POA: Diagnosis not present

## 2018-04-24 DIAGNOSIS — Z515 Encounter for palliative care: Secondary | ICD-10-CM | POA: Diagnosis not present

## 2018-04-24 DIAGNOSIS — I352 Nonrheumatic aortic (valve) stenosis with insufficiency: Secondary | ICD-10-CM | POA: Diagnosis present

## 2018-04-24 DIAGNOSIS — J45909 Unspecified asthma, uncomplicated: Secondary | ICD-10-CM | POA: Diagnosis present

## 2018-04-24 DIAGNOSIS — R400 Somnolence: Secondary | ICD-10-CM | POA: Diagnosis not present

## 2018-04-24 DIAGNOSIS — I2119 ST elevation (STEMI) myocardial infarction involving other coronary artery of inferior wall: Principal | ICD-10-CM | POA: Diagnosis present

## 2018-04-24 DIAGNOSIS — Z91013 Allergy to seafood: Secondary | ICD-10-CM | POA: Diagnosis not present

## 2018-04-24 DIAGNOSIS — M1990 Unspecified osteoarthritis, unspecified site: Secondary | ICD-10-CM | POA: Diagnosis present

## 2018-04-24 DIAGNOSIS — N401 Enlarged prostate with lower urinary tract symptoms: Secondary | ICD-10-CM | POA: Diagnosis present

## 2018-04-24 DIAGNOSIS — I340 Nonrheumatic mitral (valve) insufficiency: Secondary | ICD-10-CM | POA: Diagnosis not present

## 2018-04-24 DIAGNOSIS — E785 Hyperlipidemia, unspecified: Secondary | ICD-10-CM | POA: Diagnosis not present

## 2018-04-24 DIAGNOSIS — R4182 Altered mental status, unspecified: Secondary | ICD-10-CM | POA: Diagnosis present

## 2018-04-24 DIAGNOSIS — R9431 Abnormal electrocardiogram [ECG] [EKG]: Secondary | ICD-10-CM | POA: Diagnosis not present

## 2018-04-24 LAB — URINALYSIS, ROUTINE W REFLEX MICROSCOPIC
Bilirubin Urine: NEGATIVE
Glucose, UA: NEGATIVE mg/dL
Hgb urine dipstick: NEGATIVE
Ketones, ur: NEGATIVE mg/dL
Leukocytes,Ua: NEGATIVE
Nitrite: NEGATIVE
Protein, ur: NEGATIVE mg/dL
Specific Gravity, Urine: 1.015 (ref 1.005–1.030)
pH: 5 (ref 5.0–8.0)

## 2018-04-24 LAB — CBC WITH DIFFERENTIAL/PLATELET
Abs Immature Granulocytes: 0.02 10*3/uL (ref 0.00–0.07)
Basophils Absolute: 0 10*3/uL (ref 0.0–0.1)
Basophils Relative: 0 %
Eosinophils Absolute: 0 10*3/uL (ref 0.0–0.5)
Eosinophils Relative: 0 %
HCT: 34.1 % — ABNORMAL LOW (ref 39.0–52.0)
HEMOGLOBIN: 11.3 g/dL — AB (ref 13.0–17.0)
Immature Granulocytes: 0 %
LYMPHS PCT: 4 %
Lymphs Abs: 0.3 10*3/uL — ABNORMAL LOW (ref 0.7–4.0)
MCH: 30.1 pg (ref 26.0–34.0)
MCHC: 33.1 g/dL (ref 30.0–36.0)
MCV: 90.9 fL (ref 80.0–100.0)
Monocytes Absolute: 0.9 10*3/uL (ref 0.1–1.0)
Monocytes Relative: 11 %
NEUTROS ABS: 6.7 10*3/uL (ref 1.7–7.7)
Neutrophils Relative %: 85 %
Platelets: 173 10*3/uL (ref 150–400)
RBC: 3.75 MIL/uL — ABNORMAL LOW (ref 4.22–5.81)
RDW: 12.9 % (ref 11.5–15.5)
WBC: 7.9 10*3/uL (ref 4.0–10.5)
nRBC: 0 % (ref 0.0–0.2)

## 2018-04-24 LAB — COMPREHENSIVE METABOLIC PANEL
ALT: 16 U/L (ref 0–44)
AST: 23 U/L (ref 15–41)
Albumin: 3.7 g/dL (ref 3.5–5.0)
Alkaline Phosphatase: 109 U/L (ref 38–126)
Anion gap: 11 (ref 5–15)
BUN: 40 mg/dL — AB (ref 8–23)
CO2: 28 mmol/L (ref 22–32)
Calcium: 9.4 mg/dL (ref 8.9–10.3)
Chloride: 97 mmol/L — ABNORMAL LOW (ref 98–111)
Creatinine, Ser: 1.66 mg/dL — ABNORMAL HIGH (ref 0.61–1.24)
GFR calc Af Amer: 41 mL/min — ABNORMAL LOW (ref >60)
GFR calc non Af Amer: 35 mL/min — ABNORMAL LOW (ref >60)
Glucose, Bld: 163 mg/dL — ABNORMAL HIGH (ref 70–99)
POTASSIUM: 5.6 mmol/L — AB (ref 3.5–5.1)
Sodium: 136 mmol/L (ref 135–145)
Total Bilirubin: 0.6 mg/dL (ref 0.3–1.2)
Total Protein: 7.4 g/dL (ref 6.5–8.1)

## 2018-04-24 LAB — LACTIC ACID, PLASMA
Lactic Acid, Venous: 1.7 mmol/L (ref 0.5–1.9)
Lactic Acid, Venous: 2 mmol/L (ref 0.5–1.9)

## 2018-04-24 LAB — I-STAT TROPONIN, ED: TROPONIN I, POC: 0 ng/mL (ref 0.00–0.08)

## 2018-04-24 LAB — TROPONIN I: Troponin I: <0.03 ng/mL (ref <0.03)

## 2018-04-24 LAB — LIPASE, BLOOD: Lipase: 20 U/L (ref 11–51)

## 2018-04-24 LAB — GLUCOSE, CAPILLARY: Glucose-Capillary: 142 mg/dL — ABNORMAL HIGH (ref 70–99)

## 2018-04-24 MED ORDER — ASPIRIN EC 81 MG PO TBEC: 81.0000 mg | DELAYED_RELEASE_TABLET | Freq: Every day | ORAL | Status: DC

## 2018-04-24 MED ORDER — ASPIRIN 81 MG PO CHEW
324.0000 mg | CHEWABLE_TABLET | ORAL | Status: AC
  Administered 2018-04-24: 324 mg via ORAL
  Filled 2018-04-24: qty 4

## 2018-04-24 MED ORDER — ACETAMINOPHEN 325 MG PO TABS: 650.0000 mg | ORAL_TABLET | ORAL | Status: DC | PRN

## 2018-04-24 MED ORDER — VANCOMYCIN HCL IN DEXTROSE 1-5 GM/200ML-% IV SOLN: 1000.0000 mg | Freq: Once | INTRAVENOUS | Status: DC

## 2018-04-24 MED ORDER — ONDANSETRON HCL 4 MG/2ML IJ SOLN
4.0000 mg | Freq: Four times a day (QID) | INTRAMUSCULAR | Status: DC | PRN
  Administered 2018-04-24: 4 mg via INTRAVENOUS
  Filled 2018-04-24: qty 2

## 2018-04-24 MED ORDER — SODIUM CHLORIDE 0.9 % IV BOLUS (SEPSIS)
500.0000 mL | Freq: Once | INTRAVENOUS | Status: AC
  Administered 2018-04-24: 500 mL via INTRAVENOUS

## 2018-04-24 MED ORDER — METOPROLOL SUCCINATE ER 25 MG PO TB24
25.0000 mg | ORAL_TABLET | Freq: Two times a day (BID) | ORAL | Status: DC
  Administered 2018-04-24: 25 mg via ORAL
  Filled 2018-04-24: qty 1

## 2018-04-24 MED ORDER — ONDANSETRON HCL 4 MG/2ML IJ SOLN: 4.0000 mg | Freq: Four times a day (QID) | INTRAMUSCULAR | Status: DC | PRN

## 2018-04-24 MED ORDER — HEPARIN (PORCINE) 25000 UT/250ML-% IV SOLN
900.0000 [IU]/h | INTRAVENOUS | Status: DC
  Administered 2018-04-24 – 2018-04-25 (×2): 700 [IU]/h via INTRAVENOUS
  Filled 2018-04-24 (×2): qty 250

## 2018-04-24 MED ORDER — SODIUM CHLORIDE 0.9 % IV SOLN: 2.0000 g | Freq: Once | INTRAVENOUS | Status: DC

## 2018-04-24 MED ORDER — SODIUM CHLORIDE 0.9 % IV BOLUS (SEPSIS)
250.0000 mL | Freq: Once | INTRAVENOUS | Status: AC
  Administered 2018-04-24: 250 mL via INTRAVENOUS

## 2018-04-24 MED ORDER — RISPERIDONE 1 MG PO TABS
1.0000 mg | ORAL_TABLET | Freq: Every day | ORAL | Status: DC
  Filled 2018-04-24: qty 1

## 2018-04-24 MED ORDER — ASPIRIN 300 MG RE SUPP: 300.0000 mg | RECTAL | Status: AC

## 2018-04-24 MED ORDER — RISPERIDONE 0.5 MG PO TABS
0.5000 mg | ORAL_TABLET | Freq: Every morning | ORAL | Status: DC
  Filled 2018-04-24: qty 1

## 2018-04-24 MED ORDER — SODIUM CHLORIDE 0.9 % IV SOLN
Freq: Once | INTRAVENOUS | Status: AC
  Administered 2018-04-24: via INTRAVENOUS

## 2018-04-24 MED ORDER — GLYCOPYRROLATE 1 MG PO TABS
2.0000 mg | ORAL_TABLET | Freq: Three times a day (TID) | ORAL | Status: DC
  Administered 2018-04-24: 2 mg via ORAL
  Filled 2018-04-24: qty 2

## 2018-04-24 MED ORDER — NITROGLYCERIN 0.4 MG SL SUBL: 0.4000 mg | SUBLINGUAL_TABLET | SUBLINGUAL | Status: DC | PRN

## 2018-04-24 MED ORDER — FINASTERIDE 5 MG PO TABS
5.0000 mg | ORAL_TABLET | Freq: Every day | ORAL | Status: DC
  Administered 2018-04-24: 5 mg via ORAL
  Filled 2018-04-24: qty 1

## 2018-04-24 MED ORDER — METRONIDAZOLE IN NACL 5-0.79 MG/ML-% IV SOLN: 500.0000 mg | Freq: Three times a day (TID) | INTRAVENOUS | Status: DC

## 2018-04-24 MED ORDER — HEPARIN BOLUS VIA INFUSION
3500.0000 [IU] | Freq: Once | INTRAVENOUS | Status: AC
  Administered 2018-04-24: 3500 [IU] via INTRAVENOUS
  Filled 2018-04-24: qty 3500

## 2018-04-24 MED ORDER — SODIUM CHLORIDE 0.9 % IV BOLUS (SEPSIS): 1000.0000 mL | Freq: Once | INTRAVENOUS | Status: DC

## 2018-04-24 MED ORDER — AMLODIPINE BESYLATE 5 MG PO TABS: 5.0000 mg | ORAL_TABLET | Freq: Every day | ORAL | Status: DC

## 2018-04-24 MED ORDER — ATORVASTATIN CALCIUM 10 MG PO TABS
20.0000 mg | ORAL_TABLET | Freq: Every day | ORAL | Status: DC
  Administered 2018-04-24: 20 mg via ORAL
  Filled 2018-04-24: qty 2

## 2018-04-24 MED ORDER — ASPIRIN 81 MG PO CHEW
324.0000 mg | CHEWABLE_TABLET | Freq: Once | ORAL | Status: AC
  Administered 2018-04-24: 324 mg via ORAL
  Filled 2018-04-24: qty 4

## 2018-04-24 MED ORDER — CLOPIDOGREL BISULFATE 75 MG PO TABS
75.0000 mg | ORAL_TABLET | Freq: Every day | ORAL | Status: DC
  Administered 2018-04-24: 75 mg via ORAL
  Filled 2018-04-24: qty 1

## 2018-04-24 NOTE — H&P (Addendum)
Cardiology Admission History and Physical:   Patient ID: [name]. MRN: [id number]; DOB: Sep 05, 1924   Admission date: 04/24/2018  Primary Care Provider: Fleet Contras, MD Primary Cardiologist: Lesleigh Noe, MD   Chief Complaint:  STEMI  Patient Profile:   Mark Bose. is a 85 y.o. male with history of CAD, aortic valve replacement in 2005, peripheral arterial disease followed by Dr. Darrick Penna, hypertension, diabetes and advanced dementia presented with code STEMI.  Hx of PAD s/p right axillary bifemoral bypass as well as a right femoral to below-knee popliteal bypass. The axbifem was a Dacron graft. The femoropopliteal was a PTFE graft. S/p right subclavian stent.   Last echocardiogram in 2013 showed normal LV function, grade 2 diastolic dysfunction.  History of Present Illness:   [name] has advanced dementia and lives with wife at home.  Wheelchair-bound.  Does not communicate.  Some of his family member takes care of him 24/7.  He was in usual state of health up until 6 AM when he had vomiting.  He had multiple episode of vomiting afterwards and complained of "not feeling better".  EMS was called and found to have inferior STEMI and brought to ER.  Dr. Wyline Mood has spoke with Dr. Katrinka Blazing who is a primary cardiologist.  Plan medical management.  Wife at bedside agree with medical management and confirm DNR status.  Unable to get review of systems as patient has severe dementia.  Potassium 5.6.  Creatinine 1.66.  Hemoglobin 11.3.  CT of head without acute abnormality.   Past Medical History:  Diagnosis Date  . Amputation of great toe, left, traumatic (HCC)   . Arthritis   . Asthma    as a young child  . Blind right eye    "since age 33-4; S/P whooping cough"  . BPH (benign prostatic hypertrophy)    Dr.Nesi is urologist  . Bruises easily    takes Pletal and ASA daily  . CAD (coronary artery disease)   . Claudication (HCC)   . Colitis    hx of  . Dementia   .  Diabetes mellitus    borderline diabetic  . Gastritis   . Heart murmur   . History of shingles 3-28yrs ago  . Hx of colonic polyps   . Hyperlipidemia    takes Atorvastatin daily  . Hypertension    takes Ramipril and Metoprolol daily  . Insomnia    takes Ambien nightly  . Mental disorder    takes Aricept daily  . Palpitations    we need to exclude atrial fib and ventricular arrhythmia.  . Peripheral vascular disease (HCC)   . Urinary urgency    takes Flomax daily    Past Surgical History:  Procedure Laterality Date  . AMPUTATION  07/04/2011   Procedure: AMPUTATION FOOT;  Surgeon: Loanne Drilling, MD;  Location: [facility] OR;  Service: Orthopedics;  Laterality: Left;  . AORTIC VALVE REPLACEMENT  06/2003   /E-chart  . AXILLARY-FEMORAL BYPASS GRAFT  05/05/2011   Procedure: BYPASS GRAFT AXILLA-BIFEMORAL;  Surgeon: Sherren Kerns, MD;  Location: [facility] OR;  Service: Vascular;  Laterality: Right;  . AXILLARY-FEMORAL BYPASS GRAFT  06/10/2011   Procedure: BYPASS GRAFT AXILLA-BIFEMORAL;  Surgeon: Sherren Kerns, MD;  Location: [facility] OR;  Service: Vascular;  Laterality: Right;  REVISION Right axillary bifemoral bypass using a 8mm x 80 cm Propaten Graft  . BAND HEMORRHOIDECTOMY  1960's  . CARDIAC CATHETERIZATION     multiple-see epic  .  CARDIAC VALVE REPLACEMENT  06/2003  . CATARACT EXTRACTION     left eye  . COLONOSCOPY    . CORONARY ANGIOPLASTY     right leg  . CORONARY ANGIOPLASTY WITH STENT PLACEMENT  2000   /E-chart  . CORONARY ARTERY BYPASS GRAFT  1989/06/2003   CABG X 5; CABG X1  . FEMORAL-POPLITEAL BYPASS GRAFT  05/05/2011   Procedure: BYPASS GRAFT FEMORAL-POPLITEAL ARTERY;  Surgeon: Sherren Kerns, MD;  Location: [facility] OR;  Service: Vascular;  Laterality: Right;  . I&D EXTREMITY  07/04/2011   Procedure: IRRIGATION AND DEBRIDEMENT EXTREMITY;  Surgeon: Loanne Drilling, MD;  Location: [facility] OR;  Service: Orthopedics;  Laterality: Left;  . OSTECTOMY  09/2001   right hip/E-chart  . PERIPHERAL  VASCULAR CATHETERIZATION N/A 08/04/2014   Procedure: Aortic Arch Angiography;  Surgeon: Sherren Kerns, MD;  Location: [facility] INVASIVE CV LAB;  Service: Cardiovascular;  Laterality: N/A;  . PERIPHERAL VASCULAR CATHETERIZATION Right 08/04/2014   Procedure: Upper Extremity Angiography;  Surgeon: Sherren Kerns, MD;  Location: [facility] INVASIVE CV LAB;  Service: Cardiovascular;  Laterality: Right;  . TONSILLECTOMY  1960     Medications Prior to Admission: Prior to Admission medications   Medication Sig Start Date End Date Taking? Authorizing Provider  acetaminophen (TYLENOL) 500 MG tablet Take 500 mg by mouth every 6 (six) hours as needed.    [provider]  amLODipine (NORVASC) 5 MG tablet Take 5 mg by mouth daily.  08/26/17   [provider]  finasteride (PROSCAR) 5 MG tablet Take 1 tablet by mouth daily. 01/05/18   [provider]  furosemide (LASIX) 20 MG tablet Take 1 tablet by mouth daily as needed for edema.  01/06/18   [provider]  glycopyrrolate (ROBINUL) 2 MG tablet Take 2 mg by mouth 3 (three) times daily. 08/26/17   [provider]  metoprolol succinate (TOPROL-XL) 50 MG 24 hr tablet Take 25 mg by mouth 2 (two) times daily. 08/12/17   [provider]  nitroGLYCERIN (NITROSTAT) 0.4 MG SL tablet Place 1 tablet (0.4 mg total) under the tongue every 5 (five) minutes as needed. For chest pain 01/18/18   Lyn Records, MD  ramipril (ALTACE) 5 MG capsule Take 5 mg by mouth daily. 08/12/17   [provider]  risperiDONE (RISPERDAL) 0.5 MG tablet Take 0.5-1 mg by mouth See admin instructions. Take 1 tablet by mouth every morning and 2 tabs every night at bedtime    [provider]  sertraline (ZOLOFT) 25 MG tablet Take 25 mg by mouth daily.  11/19/16   [provider]     Allergies:    Allergies  Allergen Reactions  . Shrimp [Shellfish Allergy] Nausea And Vomiting    "haven't eaten any since 1980's" pt only allergic to  shrimp. Not shellfish    Social History:   Social History   Socioeconomic History  . Marital status: Married    Spouse name: [name]  . Number of children: 3  . Years of education: BS  . Highest education level: Not on file  Occupational History  . Occupation: retired  Engineer, production  . Financial resource strain: Not on file  . Food insecurity:    Worry: Not on file    Inability: Not on file  . Transportation needs:    Medical: Not on file    Non-medical: Not on file  Tobacco Use  . Smoking status: Former Smoker    Types: Cigarettes    Last  attempt to quit: 02/25/1940    Years since quitting: 78.2  . Smokeless tobacco: Never Used  Substance and Sexual Activity  . Alcohol use: No    Comment: "last alcohol was ~ 1950's"  . Drug use: No  . Sexual activity: Not Currently  Lifestyle  . Physical activity:    Days per week: Not on file    Minutes per session: Not on file  . Stress: Not on file  Relationships  . Social connections:    Talks on phone: Not on file    Gets together: Not on file    Attends religious service: Not on file    Active member of club or organization: Not on file    Attends meetings of clubs or organizations: Not on file    Relationship status: Not on file  . Intimate partner violence:    Fear of current or ex partner: Not on file    Emotionally abused: Not on file    Physically abused: Not on file    Forced sexual activity: Not on file  Other Topics Concern  . Not on file  Social History Narrative   Patient lives at home with his spouse.   Caffeine Use: none    Family History:   The patient's family history includes Cancer in his mother; Heart disease in his father. There is no history of Anesthesia problems, Hypotension, Malignant hyperthermia, or Pseudochol deficiency.    ROS:  Please see the history of present illness.  All other ROS reviewed and negative.     Physical Exam/Data:   Vitals:   04/24/18 1524 04/24/18 1530 04/24/18  1545  BP:  (!) 126/99 (!) 141/84  Pulse:  94 95  Resp:  18 19  SpO2:  100% 99%  Weight: 56.7 kg    Height:  (1.6 m)     No intake or output data in the 24 hours ending 04/24/18 1635 Last 3 Weights 04/24/2018 01/18/2018 09/15/2017  Weight (lbs) 125 lb 120 lb 145 lb  Weight (kg) 56.7 kg 54.432 kg 65.772 kg     Body mass index is 22.14 kg/m.  General: Thin frail ill-appearing male in no acute distress HEENT: normal Lymph: no adenopathy Neck: no JVD Endocrine:  No thryomegaly Vascular: No carotid bruits; FA pulses 2+ bilaterally without bruits  Cardiac:  normal S1, S2; RRR; no murmur  Lungs:  clear to auscultation bilaterally, no wheezing, rhonchi or rales  Abd: soft, bypass tube palpated Ext: no  edema Musculoskeletal:  No deformities, BUE and BLE strength normal and equal Skin: warm and dry  Neuro severe dementia no focal abnormalities noted Psych: Demented   EKG:  The ECG that was done today was personally reviewed and demonstrates inferior ST elevation  Relevant CV Studies: As summarized above  Laboratory Data:  Chemistry Recent Labs  Lab 04/24/18 1519  NA 136  K 5.6*  CL 97*  CO2 28  GLUCOSE 163*  BUN 40*  CREATININE 1.66*  CALCIUM 9.4  GFRNONAA 35*  GFRAA 41*  ANIONGAP 11    Recent Labs  Lab 04/24/18 1519  PROT 7.4  ALBUMIN 3.7  AST 23  ALT 16  ALKPHOS 109  BILITOT 0.6   Hematology Recent Labs  Lab 04/24/18 1519  WBC 7.9  RBC 3.75*  HGB 11.3*  HCT 34.1*  MCV 90.9  MCH 30.1  MCHC 33.1  RDW 12.9  PLT 173   Cardiac EnzymesNo results for input(s): TROPONINI in the last 168 hours.  Recent  Labs  Lab 04/24/18 1529  TROPIPOC 0.00    Radiology/Studies:  Dg Chest Port 1 View  Result Date: 04/24/2018 CLINICAL DATA:  Altered mental status, lethargy EXAM: PORTABLE CHEST 1 VIEW COMPARISON:  03/31/2017 FINDINGS: Low lung volumes. Mild bilateral interstitial thickening. Hazy left lower lobe airspace disease which may reflect atelectasis  versus pneumonia. No pleural effusion or pneumothorax. Stable cardiomediastinal silhouette. Prior CABG and aortic valve replacement. No acute osseous abnormality. IMPRESSION: Hazy left lower lobe airspace disease which may reflect atelectasis versus pneumonia. Electronically Signed   By: Elige Ko   On: 04/24/2018 15:53    Assessment and Plan:   1. Inferior STEMI -Plan medical management.  Admit to stepdown bed.  Start heparin.  -Continue beta-blocker.   2.  AKI -Likely due to severe vomiting.  Hold ACE.  3.  CODE STATUS -Patient will be DNR.  EMS has a form and family wishes to be DNR as well.  Consider palliative care consult in a.m.  Severity of Illness: The appropriate patient status for this patient is INPATIENT. Inpatient status is judged to be reasonable and necessary in order to provide the required intensity of service to ensure the patient's safety. The patient's presenting symptoms, physical exam findings, and initial radiographic and laboratory data in the context of their chronic comorbidities is felt to place them at high risk for further clinical deterioration. Furthermore, it is not anticipated that the patient will be medically stable for discharge from the hospital within 2 midnights of admission. The following factors support the patient status of inpatient.   " The patient's presenting symptoms include  STEMI. " The worrisome physical exam findings include Dementia " The initial radiographic and laboratory data are worrisome because of STEMI " The chronic co-morbidities include advanced age, dementia, STEMi   * I certify that at the point of admission it is my clinical judgment that the patient will require inpatient hospital care spanning beyond 2 midnights from the point of admission due to high intensity of service, high risk for further deterioration and high frequency of surveillance required.*    For questions or updates, please contact [facility] Please  consult [email or web address] for contact info under        Lorelei Pont, [place]  04/24/2018 4:35 PM   Patient seen and discussed with PA Bhagat, I agree with his documentation. 85 yo male with history of CAD, PAD, HTN, bioprosthetic AVR in 2005, profound dementia noted in 12/2017 clinic note. Presents with woresning mental status and nausea/vomiting. Patient no able to give any history due to severe dementia.   Initial trop neg CT head no acute process CXR hazy LLL airspace disease, atelectasis vs pneumonia? EKG SR, inferior ST elevations   Discussed with patient's family including his wife in detail patients presents with probable STEMI. Due to his severe advanced dementia they are in favor of medical therapy only, and would not pursue cath. This was discussed also with the patient's primary cardiologist Dr Katrinka Blazing who was in agreement. Patient is DNR/DNI, medical therapy at this time, may need to consider comfort care/palliative this admission depending on medical course.   Continue ASA, heparin gtt, start plavix for medically managed STEMI. Hold ACE-I due to Cr. Possible beta blocker tomorrow pending hemodynamics . Questionalble CXR, no clear infectious symptoms with normal WBC, afebrile. Monitor at this time  Mild hyperkalemia, if progresses would plan for kayelexate. Stop ACE-I   Dina Rich MD

## 2018-04-24 NOTE — Progress Notes (Signed)
ANTICOAGULATION CONSULT NOTE - Initial Consult  Pharmacy Consult for heparin Indication: chest pain/ACS  Patient Measurements: Height: 5\' 3"  (160 cm) Weight: 125 lb (56.7 kg) IBW/kg (Calculated) : 56.9 Heparin Dosing Weight: 56.7 kg  Vital Signs: BP: 141/84 (02/29 1545) Pulse Rate: 95 (02/29 1545)  Labs: Recent Labs    04/24/18 1519  HGB 11.3*  HCT 34.1*  PLT 173  CREATININE 1.66*     Medical History: Past Medical History:  Diagnosis Date  . Amputation of great toe, left, traumatic (HCC)   . Arthritis   . Asthma    as a young child  . Blind right eye    "since age 60-4; S/P whooping cough"  . BPH (benign prostatic hypertrophy)    Dr.Nesi is urologist  . Bruises easily    takes Pletal and ASA daily  . CAD (coronary artery disease)   . Claudication (HCC)   . Colitis    hx of  . Dementia   . Diabetes mellitus    borderline diabetic  . Gastritis   . Heart murmur   . History of shingles 3-62yrs ago  . Hx of colonic polyps   . Hyperlipidemia    takes Atorvastatin daily  . Hypertension    takes Ramipril and Metoprolol daily  . Insomnia    takes Ambien nightly  . Mental disorder    takes Aricept daily  . Palpitations    we need to exclude atrial fib and ventricular arrhythmia.  . Peripheral vascular disease (HCC)   . Urinary urgency    takes Flomax daily    Assessment: 85 yo male admitted with fever and emesis. Originally called as a Code STEMI > STEMI now cancelled. H/h plts wnl. Staring heparin infusion. SCr 1.3 > 1.6.   Goal of Therapy:  Heparin level 0.3-0.7 units/ml Monitor platelets by anticoagulation protocol: Yes    Plan:  -Heparin 3500 units then 700 units/hr -Daily HL, CBC -Check level in 8 hours   Baldemar Friday 04/24/2018,4:19 PM

## 2018-04-24 NOTE — ED Triage Notes (Signed)
Per [facility]- pt picked up from home due to fever, emesis x5 and more lethargic than usual x1 day. Pt has hx of alzheimers.  Baseline is A&Ox1.

## 2018-04-24 NOTE — ED Notes (Signed)
Admitting Dr bedside, possibly will change Pt to progressive bed

## 2018-04-24 NOTE — ED Notes (Signed)
Pt transported to CT ?

## 2018-04-24 NOTE — Progress Notes (Signed)
   04/24/18 1547  Clinical Encounter Type  Visited With Patient and family together;Health care provider  Visit Type Initial;ED;Code  Referral From Other (Comment) (STEMI pg)  Spiritual Encounters  Spiritual Needs Emotional;Prayer  Stress Factors  Patient Stress Factors Lack of knowledge;Loss of control;Health changes  Family Stress Factors Loss of control   Responded to STEMI pg, which was cancelled when I arrived.  Met w/ pt and his wife, with RN in room for most of my visit.  Pt did not engage much in conversation but Mrs. Pultz did and also asked for prayer.  Prayed at bedside w/ pt and Mrs. Trammel.  Mrs. Tallman reported that they have been married 22 years.  Myra Gianotti resident, [phone]

## 2018-04-24 NOTE — ED Notes (Signed)
Called [facility] to page code stemi

## 2018-04-24 NOTE — ED Provider Notes (Addendum)
[facility] EMERGENCY DEPARTMENT Provider Note   CSN: [id number] Arrival date & time: 04/24/18  1509    History   Chief Complaint Chief Complaint  Patient presents with  . Emesis    HPI [name]. is a 85 y.o. male BIB EMS for vomiting and AMS. He is DNR.  Patient has a past medical history of peripheral vascular disease, claudication, coronary artery disease, hyperlipidemia, history of aortic stenosis status post TAVR, history of quintuple bypass.  There is a level 5 caveat due to dementia and altered mental status.  History is gathered by review of EMR, EMS, and family who is at bedside.  Apparently around 1 AM the patient began having several episodes of nonbloody nonbilious vomitus.  He was able to get to sleep but when he awoke this morning he began vomiting again.  His wife states that he has more lethargic and more confused than normal.  He has not had any fevers or illnesses at home and has been otherwise well up until this morning at 1:00 in the morning.    helthstrh     Past Medical History:  Diagnosis Date  . Amputation of great toe, left, traumatic (HCC)   . Arthritis   . Asthma    as a young child  . Blind right eye    "since age 45-4; S/P whooping cough"  . BPH (benign prostatic hypertrophy)    Dr.Nesi is urologist  . Bruises easily    takes Pletal and ASA daily  . CAD (coronary artery disease)   . Claudication (HCC)   . Colitis    hx of  . Dementia   . Diabetes mellitus    borderline diabetic  . Gastritis   . Heart murmur   . History of shingles 3-67yrs ago  . Hx of colonic polyps   . Hyperlipidemia    takes Atorvastatin daily  . Hypertension    takes Ramipril and Metoprolol daily  . Insomnia    takes Ambien nightly  . Mental disorder    takes Aricept daily  . Palpitations    we need to exclude atrial fib and ventricular arrhythmia.  . Peripheral vascular disease (HCC)   . Urinary urgency    takes Flomax daily     Patient Active Problem List   Diagnosis Date Noted  . CAD (coronary artery disease) of artery bypass graft 11/24/2012    Class: Chronic  . S/P aortic valve replacement with bioprosthetic valve 11/24/2012    Class: Chronic  . Amputation, toe, traumatic (HCC) 07/04/2011  . Hematoma of groin 06/19/2011  . Atherosclerosis of native arteries of the extremities with ulceration(440.23) 06/19/2011  . Other complications due to other vascular device, implant, and graft 06/19/2011  . PVD (peripheral vascular disease) with claudication (HCC) 06/05/2011    Past Surgical History:  Procedure Laterality Date  . AMPUTATION  07/04/2011   Procedure: AMPUTATION FOOT;  Surgeon: Loanne Drilling, MD;  Location: [facility] OR;  Service: Orthopedics;  Laterality: Left;  . AORTIC VALVE REPLACEMENT  06/2003   /E-chart  . AXILLARY-FEMORAL BYPASS GRAFT  05/05/2011   Procedure: BYPASS GRAFT AXILLA-BIFEMORAL;  Surgeon: Sherren Kerns, MD;  Location: [facility] OR;  Service: Vascular;  Laterality: Right;  . AXILLARY-FEMORAL BYPASS GRAFT  06/10/2011   Procedure: BYPASS GRAFT AXILLA-BIFEMORAL;  Surgeon: Sherren Kerns, MD;  Location: [facility];  Service: Vascular;  Laterality: Right;  REVISION Right axillary bifemoral bypass using a 69mm x 80 cm Propaten Graft  .  BAND HEMORRHOIDECTOMY  1960's  . CARDIAC CATHETERIZATION     multiple-see epic  . CARDIAC VALVE REPLACEMENT  06/2003  . CATARACT EXTRACTION     left eye  . COLONOSCOPY    . CORONARY ANGIOPLASTY     right leg  . CORONARY ANGIOPLASTY WITH STENT PLACEMENT  2000   /E-chart  . CORONARY ARTERY BYPASS GRAFT  1989/06/2003   CABG X 5; CABG X1  . FEMORAL-POPLITEAL BYPASS GRAFT  05/05/2011   Procedure: BYPASS GRAFT FEMORAL-POPLITEAL ARTERY;  Surgeon: Sherren Kerns, MD;  Location: [facility] OR;  Service: Vascular;  Laterality: Right;  . I&D EXTREMITY  07/04/2011   Procedure: IRRIGATION AND DEBRIDEMENT EXTREMITY;  Surgeon: Loanne Drilling, MD;  Location: [facility] OR;  Service: Orthopedics;   Laterality: Left;  . OSTECTOMY  09/2001   right hip/E-chart  . PERIPHERAL VASCULAR CATHETERIZATION N/A 08/04/2014   Procedure: Aortic Arch Angiography;  Surgeon: Sherren Kerns, MD;  Location: [facility] INVASIVE CV LAB;  Service: Cardiovascular;  Laterality: N/A;  . PERIPHERAL VASCULAR CATHETERIZATION Right 08/04/2014   Procedure: Upper Extremity Angiography;  Surgeon: Sherren Kerns, MD;  Location: [facility] INVASIVE CV LAB;  Service: Cardiovascular;  Laterality: Right;  . TONSILLECTOMY  1960        Home Medications    Prior to Admission medications   Medication Sig Start Date End Date Taking? Authorizing Provider  acetaminophen (TYLENOL) 500 MG tablet Take 500 mg by mouth every 6 (six) hours as needed.    [provider]  amLODipine (NORVASC) 5 MG tablet Take 5 mg by mouth daily.  08/26/17   [provider]  finasteride (PROSCAR) 5 MG tablet Take 1 tablet by mouth daily. 01/05/18   [provider]  furosemide (LASIX) 20 MG tablet Take 1 tablet by mouth daily as needed for edema.  01/06/18   [provider]  glycopyrrolate (ROBINUL) 2 MG tablet Take 2 mg by mouth 3 (three) times daily. 08/26/17   [provider]  metoprolol succinate (TOPROL-XL) 50 MG 24 hr tablet Take 25 mg by mouth 2 (two) times daily. 08/12/17   [provider]  nitroGLYCERIN (NITROSTAT) 0.4 MG SL tablet Place 1 tablet (0.4 mg total) under the tongue every 5 (five) minutes as needed. For chest pain 01/18/18   Lyn Records, MD  ramipril (ALTACE) 5 MG capsule Take 5 mg by mouth daily. 08/12/17   [provider]  risperiDONE (RISPERDAL) 0.5 MG tablet Take 0.5-1 mg by mouth See admin instructions. Take 1 tablet by mouth every morning and 2 tabs every night at bedtime    [provider]  sertraline (ZOLOFT) 25 MG tablet Take 25 mg by mouth daily.  11/19/16   [provider]    Family History Family History  Problem Relation Age of Onset  . Heart disease  Father   . Cancer Mother   . Anesthesia problems Neg Hx   . Hypotension Neg Hx   . Malignant hyperthermia Neg Hx   . Pseudochol deficiency Neg Hx     Social History Social History   Tobacco Use  . Smoking status: Former Smoker    Types: Cigarettes    Last attempt to quit: 02/25/1940    Years since quitting: 78.2  . Smokeless tobacco: Never Used  Substance Use Topics  . Alcohol use: No    Comment: "last alcohol was ~ 1950's"  . Drug use: No     Allergies   Shrimp [shellfish allergy]   Review of Systems  Review of Systems  Ten systems reviewed and are negative for acute change, except as noted in the HPI.   Physical Exam Updated Vital Signs Ht  (1.6 m)   Wt 56.7 kg   BMI 22.14 kg/m   Physical Exam Vitals signs and nursing note reviewed.  Constitutional:      General: He is not in acute distress.    Appearance: He is well-developed. He is not diaphoretic.  HENT:     Head: Normocephalic and atraumatic.  Eyes:     General: No scleral icterus.    Conjunctiva/sclera: Conjunctivae normal.  Neck:     Musculoskeletal: Normal range of motion and neck supple.  Cardiovascular:     Rate and Rhythm: Normal rate and regular rhythm.     Heart sounds: Murmur present.  Pulmonary:     Effort: Pulmonary effort is normal. No respiratory distress.     Breath sounds: Normal breath sounds.  Abdominal:     Palpations: Abdomen is soft.     Tenderness: There is no abdominal tenderness.  Skin:    General: Skin is warm and dry.  Neurological:     Mental Status: He is alert.  Psychiatric:        Behavior: Behavior normal.      ED Treatments / Results  Labs (all labs ordered are listed, but only abnormal results are displayed) Labs Reviewed  LACTIC ACID, PLASMA - Abnormal; Notable for the following components:      Result Value   Lactic Acid, Venous 2.0 (*)    All other components within normal limits  COMPREHENSIVE METABOLIC PANEL - Abnormal; Notable for the  following components:   Potassium 5.6 (*)    Chloride 97 (*)    Glucose, Bld 163 (*)    BUN 40 (*)    Creatinine, Ser 1.66 (*)    GFR calc non Af Amer 35 (*)    GFR calc Af Amer 41 (*)    All other components within normal limits  CBC WITH DIFFERENTIAL/PLATELET - Abnormal; Notable for the following components:   RBC 3.75 (*)    Hemoglobin 11.3 (*)    HCT 34.1 (*)    Lymphs Abs 0.3 (*)    All other components within normal limits  GLUCOSE, CAPILLARY - Abnormal; Notable for the following components:   Glucose-Capillary 142 (*)    All other components within normal limits  LACTIC ACID, PLASMA  URINALYSIS, ROUTINE W REFLEX MICROSCOPIC  LIPASE, BLOOD  TROPONIN I  HEPARIN LEVEL (UNFRACTIONATED)  TROPONIN I  TROPONIN I  BASIC METABOLIC PANEL  CBC  I-STAT TROPONIN, ED    EKG EKG Interpretation  Date/Time:  Saturday April 24 2018 15:20:56 EST Ventricular Rate:  94 PR Interval:    QRS Duration: 130 QT Interval:  465 QTC Calculation: 582 R Axis:   -43 Text Interpretation:  Sinus rhythm Borderline short PR interval RBBB and LAFB Inferior infarct, acute (RCA) Lateral leads are also involved Probable RV involvement, suggest recording right precordial leads Artifact in lead(s) I II III aVR aVL V1 V2 V3 >>> Acute MI <<< Confirmed by Rolan Bucco [phone]) on 04/24/2018 3:24:45 PM Also confirmed by Rolan Bucco [phone])  on 04/24/2018 3:30:41 PM   Radiology No results found.  Procedures .Critical Care Performed by: Arthor Captain, PA-C Authorized by: Arthor Captain, PA-C   Critical care provider statement:    Critical care time (minutes):  35   Critical care start time:  05/12/2018 11:00 AM   Critical care  was necessary to treat or prevent imminent or life-threatening deterioration of the following conditions:  Cardiac failure   Critical care was time spent personally by me on the following activities:  Discussions with consultants, evaluation of patient's response to  treatment, examination of patient, ordering and performing treatments and interventions, ordering and review of laboratory studies, ordering and review of radiographic studies, pulse oximetry, re-evaluation of patient's condition, obtaining history from patient or surrogate and review of old charts   (including critical care time)  Medications Ordered in ED Medications  amLODipine (NORVASC) tablet 5 mg (0 mg Oral Hold 04/24/18 1900)  metoprolol succinate (TOPROL-XL) 24 hr tablet 25 mg (25 mg Oral Given 04/24/18 2047)  risperiDONE (RISPERDAL) tablet 0.5 mg (has no administration in time range)  glycopyrrolate (ROBINUL) tablet 2 mg (2 mg Oral Given 04/24/18 2047)  finasteride (PROSCAR) tablet 5 mg (5 mg Oral Given 04/24/18 2047)  aspirin EC tablet 81 mg (has no administration in time range)  nitroGLYCERIN (NITROSTAT) SL tablet 0.4 mg (has no administration in time range)  acetaminophen (TYLENOL) tablet 650 mg (has no administration in time range)  ondansetron (ZOFRAN) injection 4 mg (4 mg Intravenous Given 04/24/18 2310)  heparin ADULT infusion 100 units/mL (25000 units/249mL sodium chloride 0.45%) (700 Units/hr Intravenous New Bag/Given 04/24/18 1650)  clopidogrel (PLAVIX) tablet 75 mg (75 mg Oral Given 04/24/18 2046)  atorvastatin (LIPITOR) tablet 20 mg (20 mg Oral Given 04/24/18 2046)  risperiDONE (RISPERDAL) tablet 1 mg (1 mg Oral Not Given 04/24/18 2115)  0.9 %  sodium chloride infusion (has no administration in time range)  sodium chloride 0.9 % bolus 500 mL (0 mLs Intravenous Stopped 04/24/18 2011)    And  sodium chloride 0.9 % bolus 250 mL (0 mLs Intravenous Stopped 04/24/18 2028)  aspirin chewable tablet 324 mg (324 mg Oral Given 04/24/18 1541)  heparin bolus via infusion 3,500 Units (3,500 Units Intravenous Bolus from Bag 04/24/18 1652)  aspirin chewable tablet 324 mg (324 mg Oral Given 04/24/18 2046)    Or  aspirin suppository 300 mg ( Rectal See Alternative 04/24/18 2046)     Initial  Impression / Assessment and Plan / ED Course  I have reviewed the triage vital signs and the nursing notes.  Pertinent labs & imaging results that were available during my care of the patient were reviewed by me and considered in my medical decision making (see chart for details).       Ht  (1.6 m)   Wt 56.7 kg   BMI 22.14 kg/m    Patient here with altered mental status, EKG shows inferior STEMI.  I have asked for a stat head CT to make sure that he is not having a head bleed masquerading as a stemi. I have therefor not begun Heparin. CODE STEMI was called, however Dr. Katrinka Blazing did not feel that he is a good candidate for PCI. He asks for medical management. I have spoke with Dr. Wyline Mood who will see and admit the patient.  Final Clinical Impressions(s) / ED Diagnoses   Final diagnoses:  None    ED Discharge Orders    None       Arthor Captain, PA-C 04/24/18 2339    Rolan Bucco, MD 04/24/18 2351    Arthor Captain, PA-C 05/12/18 1101    Rolan Bucco, MD 05/13/18 1458

## 2018-04-25 LAB — CBC
HCT: 32.2 % — ABNORMAL LOW (ref 39.0–52.0)
HEMOGLOBIN: 10.5 g/dL — AB (ref 13.0–17.0)
MCH: 28.9 pg (ref 26.0–34.0)
MCHC: 32.6 g/dL (ref 30.0–36.0)
MCV: 88.7 fL (ref 80.0–100.0)
Platelets: 187 10*3/uL (ref 150–400)
RBC: 3.63 MIL/uL — AB (ref 4.22–5.81)
RDW: 12.9 % (ref 11.5–15.5)
WBC: 5.2 10*3/uL (ref 4.0–10.5)
nRBC: 0 % (ref 0.0–0.2)

## 2018-04-25 LAB — BASIC METABOLIC PANEL
Anion gap: 10 (ref 5–15)
BUN: 36 mg/dL — ABNORMAL HIGH (ref 8–23)
CO2: 24 mmol/L (ref 22–32)
Calcium: 8.5 mg/dL — ABNORMAL LOW (ref 8.9–10.3)
Chloride: 103 mmol/L (ref 98–111)
Creatinine, Ser: 1.75 mg/dL — ABNORMAL HIGH (ref 0.61–1.24)
GFR calc non Af Amer: 33 mL/min — ABNORMAL LOW (ref >60)
GFR, EST AFRICAN AMERICAN: 39 mL/min — AB (ref >60)
Glucose, Bld: 195 mg/dL — ABNORMAL HIGH (ref 70–99)
Potassium: 4.5 mmol/L (ref 3.5–5.1)
Sodium: 137 mmol/L (ref 135–145)

## 2018-04-25 LAB — GLUCOSE, CAPILLARY
GLUCOSE-CAPILLARY: 160 mg/dL — AB (ref 70–99)
Glucose-Capillary: 178 mg/dL — ABNORMAL HIGH (ref 70–99)
Glucose-Capillary: 132 mg/dL — ABNORMAL HIGH (ref 70–99)

## 2018-04-25 LAB — HEPARIN LEVEL (UNFRACTIONATED)
HEPARIN UNFRACTIONATED: 0.65 [IU]/mL (ref 0.30–0.70)
Heparin Unfractionated: 0.39 IU/mL (ref 0.30–0.70)

## 2018-04-25 LAB — TROPONIN I
Troponin I: <0.03 ng/mL (ref <0.03)
Troponin I: 0.03 ng/mL (ref <0.03)

## 2018-04-25 MED ORDER — DEXTROSE-NACL 5-0.45 % IV SOLN
INTRAVENOUS | Status: AC
  Administered 2018-04-25: 10:00:00 via INTRAVENOUS

## 2018-04-25 MED ORDER — PROMETHAZINE HCL 25 MG/ML IJ SOLN
12.5000 mg | Freq: Once | INTRAMUSCULAR | Status: AC
  Administered 2018-04-25: 12.5 mg via INTRAVENOUS
  Filled 2018-04-25: qty 1

## 2018-04-25 MED ORDER — ASPIRIN 300 MG RE SUPP
300.0000 mg | Freq: Every day | RECTAL | Status: DC
  Administered 2018-04-25: 300 mg via RECTAL
  Filled 2018-04-25: qty 1

## 2018-04-25 NOTE — Progress Notes (Signed)
Pt is getting tachy with several PVCs, HR 125, RR 36, sat 93 at room air. Notified PA, PA stated to continue to monitor closely

## 2018-04-25 NOTE — Progress Notes (Signed)
CRITICAL VALUE ALERT  Critical Value:  Troponin 0.03  Date & Time Notied:  04/25/2018 9371  Provider Notified: Iver Nestle, PA Orders Received/Actions taken: N/A

## 2018-04-25 NOTE — Progress Notes (Signed)
ANTICOAGULATION CONSULT NOTE   Pharmacy Consult for Heparin Indication: chest pain/ACS  Patient Measurements: Height: 5\' 3"  (160 cm) Weight: 131 lb 6.3 oz (59.6 kg) IBW/kg (Calculated) : 56.9 Heparin Dosing Weight: 56.7 kg  Vital Signs: Temp: 99.2 F (37.3 C) (03/01 0745) Temp Source: Oral (03/01 0745) BP: 110/74 (03/01 0745) Pulse Rate: 117 (03/01 0745)  Labs: Recent Labs    04/24/18 1519 04/24/18 1848 04/25/18 0059 04/25/18 0656 04/25/18 1033  HGB 11.3*  --   --  10.5*  --   HCT 34.1*  --   --  32.2*  --   PLT 173  --   --  187  --   HEPARINUNFRC  --   --  0.65  --  0.39  CREATININE 1.66*  --   --  1.75*  --   TROPONINI  --  <0.03 <0.03 0.03*  --      Medical History: Past Medical History:  Diagnosis Date  . Amputation of great toe, left, traumatic (HCC)   . Arthritis   . Asthma    as a young child  . Blind right eye    "since age 43-4; S/P whooping cough"  . BPH (benign prostatic hypertrophy)    Dr.Nesi is urologist  . Bruises easily    takes Pletal and ASA daily  . CAD (coronary artery disease)   . Claudication (HCC)   . Colitis    hx of  . Dementia   . Diabetes mellitus    borderline diabetic  . Gastritis   . Heart murmur   . History of shingles 3-75yrs ago  . Hx of colonic polyps   . Hyperlipidemia    takes Atorvastatin daily  . Hypertension    takes Ramipril and Metoprolol daily  . Insomnia    takes Ambien nightly  . Mental disorder    takes Aricept daily  . Palpitations    we need to exclude atrial fib and ventricular arrhythmia.  . Peripheral vascular disease (HCC)   . Urinary urgency    takes Flomax daily    Assessment: 85 yo male admitted with fever and emesis. Originally called as a Code STEMI which was cancelled. Now managing medically. On IV heparin.   HL remains wnl this AM on 700 units/hr. H/H down slightly. Plt wnl.   Goal of Therapy:  Heparin level 0.3-0.7 units/ml Monitor platelets by anticoagulation protocol: Yes    Plan:  -Cont heparin drip at 700 units/hr -Monitor daily HL   Vinnie Level, PharmD., BCPS Clinical Pharmacist Clinical phone for 04/25/18 until 3:30pm: [phone] If after 3:30pm, please refer to [facility] for unit-specific pharmacist

## 2018-04-25 NOTE — Consult Note (Signed)
Consultation Note Date: 04/25/2018   Patient Name: [name].  DOB: 1924/04/09  MRN: [id number]  Age / Sex: 85 y.o., male  PCP: Nolene Ebbs, MD Referring Physician: Robinette Haines, MD  Reason for Consultation: Establishing goals of care  HPI/Patient Profile: 85 y.o. male admitted on 04/24/2018 from home with complaints of increased weakness and several episodes of vomiting. He has a past medical history significant for aortic valve replacement (2005), CAD, diabetes, hypertension, advanced dementia, peripheral artery disease s/p right axillary bifemoral bypass and right femoral-below knee popliteal bypass, right subclavian stent, amputation of left great toe, right eye blindness (since age of 74), and BPH. During ED visit wife reported patient does not communicate and requires 24/7 care. She reported patient having multiple episodes of vomiting starting early morning. EMS was called and EKG showed inferior STEMI, and confirmed. Cr 1.66, K 5.6, Head CT showed no acute abnormalities. Cardiology notified and per Dr. Tamala Julian patient's primary cardiologist, patient only appropriate for medical management at this time. Since admission he continues on IV heparin. He remains lethargic and does not follow commands. He is unable to take anything by mouth and is receiving ASA via suppository. Palliative Medicine team consulted for goals of care.  Clinical Assessment and Goals of Care: I have reviewed medical records including lab results, imaging, Epic notes, and MAR, received report from the bedside RN, and assessed the patient. I met at the bedside with Purcell Nails  to discuss diagnosis prognosis, [place], EOL wishes, disposition and options. Patient remains lethargic, will not respond to name or touch. Does not follow commands. Does not open eyes to name or stimuli. Wife reports his baseline is minimum verbal response with  occasional words or phrases. Some days she reports he may not speak at all, however, he would open eyes and follow commands.    I introduced Palliative Medicine as specialized medical care for people living with serious illness. It focuses on providing relief from the symptoms and stress of a serious illness. The goal is to improve quality of life for both the patient and the family.  Mrs. 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 He has 3 children, 2 who live in [place] and a daughter who resides in [place]. He had 3 siblings, 2 of whom are living, 1 has advanced dementia also. He and his wife have been married for 22 years. He is a man of [place] and always enjoyed spending time with family and watching sports.   As far as functional and nutritional status wife reports prior to admission patient had an excellent appetite. She reported he would eat 3 meals a day and most often have at least 2 snacks in between. He did require his medicines to be crushed for administration. He is chairbound with turn pivot assistance. She reports [facility] a [facility] from 730-8am until about 530 pm. She then had hired caregivers from 8pm-8am. He is incontinent of  urine and stool.   We discussed his current illness and what it means in the larger context of his on-going co-morbidities.  Natural disease trajectory and expectations at EOL were discussed. Wife verbalized understanding of patient's condition. She also reported that patient seemed to have been sleeping more over the past 2 weeks prior to admission and somewhat more weaker than usual. She was worried his dementia was further progressing. Patient's daughter in [place] also called during conversation and was provided with updates and engaged in conversation. Daughter was tearful and expressed she was attempting to find a flight for later today or first thing tomorrow with hopes of arriving and assisting  with final decisions. She verbalized understanding of his current illness. She is tearful in stating, she did not want him to suffer and to please not let him die until she could at least get here to see him. Support given, however, I did also explain to her given his current illness we could not guarantee that patient would not have further complications prior to her arrival that could result in death. Expressed aware that no decisions to transition to comfort will be made until all children are available at wife's request, however patient is a confirmed DNR/DNI in the event of a sudden cardiac or respiratory event. Daughter verbalized understanding.   I also discussed in details wife and daughter, patient's poor po intake and the need for nutritonal support to sustain life. Both daughter and patient verbalized understanding and also expressed wishes for no PEG tube based on patient's expressed wishes previously.   I attempted to elicit values and goals of care important to the patient.    The difference between aggressive medical intervention and comfort care was considered in light of the patient's goals of care. Wife verbalized wishes to continue to treat. She expresses tearfully she is hopeful for some improvement but feels as though this is leading to EOL care. Sister also on phone involved in conversation. She verbalized she felt patient should be kept comfortable and mentioned possibility of transferring to [facility].    I educated family on what comfort care measures would look like in the event they chose to proceed with transitioning to full comfort are.    Again wife confirmed DNR/DNI and no PEG tube.   Hospice and Palliative Care services outpatient were explained and offered. I discussed options of home with hospice and continued family or private caregiver 24/7 support. Wife tearful and expressed she knew rehab was not an option, he could not return to [facility] daycare in his current  state, she would not be able to care for him in the home alone due to her own health constraints, and uncertainty that she could afford 24/7 caregivers. She expressed his children all work and have families and did not feel they could up and come and assist with 24/7care. She and sister in discussion and again feel if comfort is the final decision they would be more open to [facility]. We discussed criteria of 2 weeks or less prognosis for residential hospice. Family verbalized their understanding and awareness of goals, criteria, and philosophy of hospice care both in the home and at a hospice home.   Wife again expresses she would like to continue to watchfully wait and allow children to be present and provide input before she makes final decision. Support given. Family is requesting follow up tomorrow for continued conversation and support.   Questions and concerns were addressed. The family was  encouraged to call with questions or concerns.  PMT will continue to support holistically.  Primary Decision Maker HCPOA/WIFE: CHRISTINE Crunkleton     SUMMARY OF RECOMMENDATIONS    DNR/DNI-as confirmed by wife  Continue to treat per attending. NO PEG tube per wife.   Wife and family aware of current illness and co-morbidities. They are realistic, however, would like to at least allow another 24-48hrs for children to arrive from out of state and provide input towards final decisions. Spoke with daughter in [place] at the bedside via phone with wife. She verbalized understanding and attempting to book a flight today or early tomorrow.   Wife leaning more towards transitioning to comfort and [place] versus home as she can not care for him in the home alone and he cannot continue to go to Adult Daycare in his current state.   PMT will continue to follow and support. Plans to follow back up with wife and family around lunch time at their request.   Code Status/Advance Care Planning:  DNR/DNI   Symptom  Management:   Per attending   Palliative Prophylaxis:   Aspiration, Bowel Regimen, Delirium Protocol, Frequent Pain Assessment, Palliative Wound Care and Turn Reposition  Additional Recommendations (Limitations, Scope, Preferences):  Full Scope Treatment, No Artificial Feeding and No Surgical Procedures  Psycho-social/Spiritual:   Desire for further Chaplaincy support: YES  Additional Recommendations: Education on Hospice  Prognosis:   Guarded to Poor in the setting of STEMI, medical management only, severe dementia, poor po intake, decreased mobility, diabetes, CAD, hypertension, PVD, right eye blindness, and BPH.  Discharge Planning: To Be Determined      Primary Diagnoses: Present on Admission: . STEMI (ST elevation myocardial infarction) ([place])   I have reviewed the medical record, interviewed the patient and family, and examined the patient. The following aspects are pertinent.  Past Medical History:  Diagnosis Date  . Amputation of great toe, left, traumatic ([place])   . Arthritis   . Asthma    as a young child  . Blind right eye    "since age 23-4; S/P whooping cough"  . BPH (benign prostatic hypertrophy)    Dr.Nesi is urologist  . Bruises easily    takes Pletal and ASA daily  . CAD (coronary artery disease)   . Claudication ([place])   . Colitis    hx of  . Dementia   . Diabetes mellitus    borderline diabetic  . Gastritis   . Heart murmur   . History of shingles 3-31yr ago  . Hx of colonic polyps   . Hyperlipidemia    takes Atorvastatin daily  . Hypertension    takes Ramipril and Metoprolol daily  . Insomnia    takes Ambien nightly  . Mental disorder    takes Aricept daily  . Palpitations    we need to exclude atrial fib and ventricular arrhythmia.  . Peripheral vascular disease ([place]   . Urinary urgency    takes Flomax daily   Social History   Socioeconomic History  . Marital status: Married    Spouse name: CKaspar Albornoz . Number of  children: 3  . Years of education: BS  . Highest education level: Not on file  Occupational History  . Occupation: retired  SScientific laboratory technician . Financial resource strain: Not on file  . Food insecurity:    Worry: Not on file    Inability: Not on file  . Transportation needs:    Medical: Not on file  Non-medical: Not on file  Tobacco Use  . Smoking status: Former Smoker    Types: Cigarettes    Last attempt to quit: 02/25/1940    Years since quitting: 78.2  . Smokeless tobacco: Never Used  Substance and Sexual Activity  . Alcohol use: No    Comment: "last alcohol was ~ 1950's"  . Drug use: No  . Sexual activity: Not Currently  Lifestyle  . Physical activity:    Days per week: Not on file    Minutes per session: Not on file  . Stress: Not on file  Relationships  . Social connections:    Talks on phone: Not on file    Gets together: Not on file    Attends religious service: Not on file    Active member of club or organization: Not on file    Attends meetings of clubs or organizations: Not on file    Relationship status: Not on file  Other Topics Concern  . Not on file  Social History Narrative   Patient lives at home with his spouse.   Caffeine Use: none   Family History  Problem Relation Age of Onset  . Heart disease Father   . Cancer Mother   . Anesthesia problems Neg Hx   . Hypotension Neg Hx   . Malignant hyperthermia Neg Hx   . Pseudochol deficiency Neg Hx    Scheduled Meds: . aspirin  300 mg Rectal Daily   Continuous Infusions: . dextrose 5 % and 0.45% NaCl 50 mL/hr at 04/25/18 0941  . heparin 700 Units/hr (04/24/18 1655)   PRN Meds:.acetaminophen, nitroGLYCERIN, ondansetron (ZOFRAN) IV Medications Prior to Admission:  Prior to Admission medications   Medication Sig Start Date End Date Taking? Authorizing Provider  acetaminophen (TYLENOL) 500 MG tablet Take 500 mg by mouth every 6 (six) hours as needed for mild pain or headache.    Yes [provider]  amLODipine (NORVASC) 5 MG tablet Take 5 mg by mouth daily.  08/26/17  Yes [provider]  finasteride (PROSCAR) 5 MG tablet Take 5 mg by mouth daily.  01/05/18  Yes [provider]  furosemide (LASIX) 20 MG tablet Take 20 mg by mouth daily as needed for fluid or edema.  01/06/18  Yes [provider]  glycopyrrolate (ROBINUL) 2 MG tablet Take 2 mg by mouth 3 (three) times daily. 08/26/17  Yes [provider]  metoprolol succinate (TOPROL-XL) 50 MG 24 hr tablet Take 25 mg by mouth 2 (two) times daily. 08/12/17  Yes [provider]  nitroGLYCERIN (NITROSTAT) 0.4 MG SL tablet Place 1 tablet (0.4 mg total) under the tongue every 5 (five) minutes as needed. For chest pain 01/18/18  Yes Belva Crome, MD  ramipril (ALTACE) 5 MG capsule Take 5 mg by mouth daily. 08/12/17  Yes [provider]  risperiDONE (RISPERDAL) 0.5 MG tablet Take 0.5-1 mg by mouth See admin instructions. Take 1 tablet by mouth every morning and 2 tabs every night at bedtime   Yes [provider]  sertraline (ZOLOFT) 25 MG tablet Take 25 mg by mouth daily.  11/19/16  Yes [provider]   Allergies  Allergen Reactions  . Shrimp [Shellfish Allergy] Nausea And Vomiting    "haven't eaten any since 1980's" pt only allergic to shrimp. Not shellfish   Review of Systems  Unable to perform ROS: Dementia    Physical Exam Vitals signs and nursing note reviewed.  Constitutional:      Appearance:  He is normal weight. He is ill-appearing.     Comments: Unable to follow commands  Cardiovascular:     Rate and Rhythm: Regular rhythm. Tachycardia present.     Pulses: Normal pulses.     Heart sounds: Normal heart sounds.  Pulmonary:     Effort: Pulmonary effort is normal.     Breath sounds: Decreased breath sounds present.  Abdominal:     General: Abdomen is flat. Bowel sounds are decreased.     Palpations: Abdomen is soft.  Skin:    General: Skin is  warm and dry.  Neurological:     Mental Status: He is lethargic.     Motor: Weakness and atrophy present.     Comments: Unable to follow commands, at baseline per family, minimum verbal response at baseline with only a few words, no verbal response at this time  Psychiatric:        Cognition and Memory: Cognition is impaired. Memory is impaired.        Judgment: Judgment is inappropriate.     Vital Signs: BP 121/69 (BP Location: Right Arm)   Pulse (!) 120   Temp 99.1 F (37.3 C) (Axillary)   Resp (!) 34   Ht 5' 3"  (1.6 m)   Wt 59.6 kg   SpO2 94%   BMI 23.28 kg/m  Pain Scale: Faces       SpO2: SpO2: 94 % O2 Device:SpO2: 94 % O2 Flow Rate: .   IO: Intake/output summary:   Intake/Output Summary (Last 24 hours) at 04/25/2018 1501 Last data filed at 04/25/2018 1126 Gross per 24 hour  Intake 154.85 ml  Output 700 ml  Net -545.15 ml    LBM: Last BM Date: 04/23/18 Baseline Weight: Weight: 56.7 kg Most recent weight: Weight: 59.6 kg     Palliative Assessment/Data: NPO, Chairbound     Time In: 1130 Time Out: 1300 Time Total: 90 min  Greater than 50%  of this time was spent counseling and coordinating care related to the above assessment and plan.  Signed by:  Alda Lea, AGPCNP-BC Palliative Medicine Team  Phone: [phone] Fax: [phone] Pager: [phone] Amion: Bjorn Pippin    Please contact Palliative Medicine Team phone at [phone] for questions and concerns.  For individual provider: See Shea Evans

## 2018-04-25 NOTE — Progress Notes (Addendum)
Progress Note  Patient Name: [name]. Date of Encounter: 04/25/2018  Primary Cardiologist: Lesleigh Noe, MD   Subjective   Some N/V overnight  Inpatient Medications    Scheduled Meds: . amLODipine  5 mg Oral Daily  . aspirin EC  81 mg Oral Daily  . atorvastatin  20 mg Oral q1800  . clopidogrel  75 mg Oral Daily  . finasteride  5 mg Oral Daily  . glycopyrrolate  2 mg Oral TID  . metoprolol succinate  25 mg Oral BID  . risperiDONE  0.5 mg Oral q morning - 10a  . risperiDONE  1 mg Oral QHS   Continuous Infusions: . sodium chloride 50 mL/hr at 04/24/18 2344  . heparin 700 Units/hr (04/24/18 1650)   PRN Meds: acetaminophen, nitroGLYCERIN, ondansetron (ZOFRAN) IV   Vital Signs    Vitals:   04/24/18 2352 04/25/18 0033 04/25/18 0426 04/25/18 0745  BP: (!) 102/55 (!) 109/50 (!) 101/52 110/74  Pulse: 99 97 (!) 113 (!) 117  Resp: 19 (!) 32 (!) 28 (!) 35  Temp: 98.5 F (36.9 C)  99.1 F (37.3 C) 99.2 F (37.3 C)  TempSrc: Axillary  Axillary Oral  SpO2: 94% 94% 94% 94%  Weight:   59.6 kg   Height:        Intake/Output Summary (Last 24 hours) at 04/25/2018 0828 Last data filed at 04/25/2018 0427 Gross per 24 hour  Intake -  Output 300 ml  Net -300 ml   Last 3 Weights 04/25/2018 04/24/2018 04/24/2018  Weight (lbs) 131 lb 6.3 oz 134 lb 14.7 oz 125 lb  Weight (kg) 59.6 kg 61.2 kg 56.7 kg      Telemetry    SR, ST elevations- Personally Reviewed  ECG    na  Physical Exam  * GEN: No acute distress.   Neck: No JVD Cardiac: RRR, no murmurs, rubs, or gallops.  Respiratory: Clear to auscultation bilaterally. GI: Soft, nontender, non-distended  MS: No edema; No deformity. Neuro:does not respond to voice   Labs    Chemistry Recent Labs  Lab 04/24/18 1519  NA 136  K 5.6*  CL 97*  CO2 28  GLUCOSE 163*  BUN 40*  CREATININE 1.66*  CALCIUM 9.4  PROT 7.4  ALBUMIN 3.7  AST 23  ALT 16  ALKPHOS 109  BILITOT 0.6  GFRNONAA 35*  GFRAA 41*    ANIONGAP 11     Hematology Recent Labs  Lab 04/24/18 1519 04/25/18 0656  WBC 7.9 5.2  RBC 3.75* 3.63*  HGB 11.3* 10.5*  HCT 34.1* 32.2*  MCV 90.9 88.7  MCH 30.1 28.9  MCHC 33.1 32.6  RDW 12.9 12.9  PLT 173 187    Cardiac Enzymes Recent Labs  Lab 04/24/18 1848 04/25/18 0059  TROPONINI <0.03 <0.03    Recent Labs  Lab 04/24/18 1529  TROPIPOC 0.00     BNPNo results for input(s): BNP, PROBNP in the last 168 hours.   DDimer No results for input(s): DDIMER in the last 168 hours.   Radiology    Ct Head Wo Contrast  Result Date: 04/24/2018 CLINICAL DATA:  Altered mental status EXAM: CT HEAD WITHOUT CONTRAST TECHNIQUE: Contiguous axial images were obtained from the base of the skull through the vertex without intravenous contrast. COMPARISON:  01/24/2018 FINDINGS: Brain: No evidence of acute infarction, hemorrhage, extra-axial collection, ventriculomegaly, or mass effect. Small old left thalamus lacunar infarct. Generalized cerebral atrophy. Periventricular white matter low attenuation likely secondary to microangiopathy.  Vascular: Cerebrovascular atherosclerotic calcifications are noted. Skull: Negative for fracture or focal lesion. Sinuses/Orbits: Right phthisis bulbi. Mastoid sinuses are clear. Bilateral maxillary sinus air-fluid levels as can be seen with sinusitis. Other: None. IMPRESSION: 1. No acute intracranial pathology. 2. Bilateral maxillary sinus air-fluid levels as can be seen with sinusitis. Electronically Signed   By: Elige Ko   On: 04/24/2018 16:33   Dg Chest Port 1 View  Result Date: 04/24/2018 CLINICAL DATA:  Altered mental status, lethargy EXAM: PORTABLE CHEST 1 VIEW COMPARISON:  03/31/2017 FINDINGS: Low lung volumes. Mild bilateral interstitial thickening. Hazy left lower lobe airspace disease which may reflect atelectasis versus pneumonia. No pleural effusion or pneumothorax. Stable cardiomediastinal silhouette. Prior CABG and aortic valve replacement. No  acute osseous abnormality. IMPRESSION: Hazy left lower lobe airspace disease which may reflect atelectasis versus pneumonia. Electronically Signed   By: Elige Ko   On: 04/24/2018 15:53    Cardiac Studies    Patient Profile     [name]. is a 85 y.o. male with history of CAD, aortic valve replacement in 2005, peripheral arterial disease followed by Dr. Darrick Penna, hypertension, diabetes and advanced dementia presented with code STEMI.  Assessment & Plan    1. STEMI - presented with inferior ST elevations, code STEMI - patient with severe advanced dementia. After detailed discussions with patient's family including his wife, along with his primary cardiologist Dr Katrinka Blazing who knows patient well and received the initial STEMI call, it was decided to treat medically and not pursue cath  - medical therapy with ASA 81, atorva 20, plavix 75, hep gtt. Hemodynamica stable, toprol 25mg  bid. No ACE due to hyperkalemia  - unclear why trops are negative from last night, follow up AM labs. CT head was negative for acute intracranial process, if trop remains negative will need to reconsider other causes for ST elevations. His baseline neuro status is so limited difficult to detect any acute pathologies, not able to get any history as he is nonverbal.  - f/u echo to evaluate for WMAs, very odd presentation thus far.   2. Hyperkalemia - follow repeat labs - no ACE/ARb   3. Severe dementia - family reports he is at his baseline. Overall does not communicate regularly, family at times can say a few words. - CT head no acute process. UA negative  - I discussed with wife again in detail today. Current mental status is within his normal range of baseline.    - we will consult palliative care. I think comfort care/hospice is a real consideration   4. N/V - difficulty getting pills down due to AMS, N/V - we will d/c oral pills, start ASA suppository - D51/2NS at 50 mL/hr since not taking  po   4. DNR/DNI - I have contacted palliative care.      For questions or updates, please contact [facility] Please consult [email or web address] for contact info under        Signed, Dina Rich, MD  04/25/2018, 8:28 AM

## 2018-04-25 NOTE — Progress Notes (Signed)
ANTICOAGULATION CONSULT NOTE   Pharmacy Consult for Heparin Indication: chest pain/ACS  Patient Measurements: Height: 5\' 3"  (160 cm) Weight: 134 lb 14.7 oz (61.2 kg) IBW/kg (Calculated) : 56.9 Heparin Dosing Weight: 56.7 kg  Vital Signs: Temp: 98.5 F (36.9 C) (02/29 2352) Temp Source: Axillary (02/29 2352) BP: 109/50 (03/01 0033) Pulse Rate: 97 (03/01 0033)  Labs: Recent Labs    04/24/18 1519 04/24/18 1848 04/25/18 0059  HGB 11.3*  --   --   HCT 34.1*  --   --   PLT 173  --   --   HEPARINUNFRC  --   --  0.65  CREATININE 1.66*  --   --   TROPONINI  --  <0.03 <0.03     Medical History: Past Medical History:  Diagnosis Date  . Amputation of great toe, left, traumatic ([facility])   . Arthritis   . Asthma    as a young child  . Blind right eye    "since age 7-4; S/P whooping cough"  . BPH (benign prostatic hypertrophy)    Dr.Nesi is urologist  . Bruises easily    takes Pletal and ASA daily  . CAD (coronary artery disease)   . Claudication ([facility])   . Colitis    hx of  . Dementia   . Diabetes mellitus    borderline diabetic  . Gastritis   . Heart murmur   . History of shingles 3-16yrs ago  . Hx of colonic polyps   . Hyperlipidemia    takes Atorvastatin daily  . Hypertension    takes Ramipril and Metoprolol daily  . Insomnia    takes Ambien nightly  . Mental disorder    takes Aricept daily  . Palpitations    we need to exclude atrial fib and ventricular arrhythmia.  . Peripheral vascular disease ([facility])   . Urinary urgency    takes Flomax daily    Assessment: 85 yo male admitted with fever and emesis. Originally called as a Code STEMI > STEMI now cancelled. H/h plts wnl. Staring heparin infusion. SCr 1.3 > 1.6.  3/1 AM update: initial heparin level is therapeutic   Goal of Therapy:  Heparin level 0.3-0.7 units/ml Monitor platelets by anticoagulation protocol: Yes   Plan:  -Cont heparin drip at 700 units/hr -Confirmatory heparin level in 8  hours  Abran Duke, PharmD, BCPS Clinical Pharmacist Phone: [phone]

## 2018-04-26 ENCOUNTER — Inpatient Hospital Stay (HOSPITAL_COMMUNITY): Payer: Medicare Other

## 2018-04-26 ENCOUNTER — Encounter (HOSPITAL_COMMUNITY): Payer: Self-pay | Admitting: *Deleted

## 2018-04-26 ENCOUNTER — Other Ambulatory Visit: Payer: Self-pay

## 2018-04-26 DIAGNOSIS — I340 Nonrheumatic mitral (valve) insufficiency: Secondary | ICD-10-CM

## 2018-04-26 DIAGNOSIS — R931 Abnormal findings on diagnostic imaging of heart and coronary circulation: Secondary | ICD-10-CM

## 2018-04-26 DIAGNOSIS — Z7189 Other specified counseling: Secondary | ICD-10-CM

## 2018-04-26 DIAGNOSIS — R627 Adult failure to thrive: Secondary | ICD-10-CM

## 2018-04-26 DIAGNOSIS — Z66 Do not resuscitate: Secondary | ICD-10-CM

## 2018-04-26 DIAGNOSIS — R9431 Abnormal electrocardiogram [ECG] [EKG]: Secondary | ICD-10-CM

## 2018-04-26 DIAGNOSIS — Z515 Encounter for palliative care: Secondary | ICD-10-CM

## 2018-04-26 DIAGNOSIS — I361 Nonrheumatic tricuspid (valve) insufficiency: Secondary | ICD-10-CM

## 2018-04-26 LAB — CBC
HCT: 32.6 % — ABNORMAL LOW (ref 39.0–52.0)
Hemoglobin: 10.7 g/dL — ABNORMAL LOW (ref 13.0–17.0)
MCH: 28.8 pg (ref 26.0–34.0)
MCHC: 32.8 g/dL (ref 30.0–36.0)
MCV: 87.9 fL (ref 80.0–100.0)
Platelets: 180 10*3/uL (ref 150–400)
RBC: 3.71 MIL/uL — ABNORMAL LOW (ref 4.22–5.81)
RDW: 13 % (ref 11.5–15.5)
WBC: 7.1 10*3/uL (ref 4.0–10.5)
nRBC: 0 % (ref 0.0–0.2)

## 2018-04-26 LAB — ECHOCARDIOGRAM COMPLETE
Height: 63 in
Weight: 2132.8 oz

## 2018-04-26 LAB — HEPARIN LEVEL (UNFRACTIONATED): Heparin Unfractionated: 0.2 IU/mL — ABNORMAL LOW (ref 0.30–0.70)

## 2018-04-26 LAB — TROPONIN I: Troponin I: 0.11 ng/mL (ref <0.03)

## 2018-04-26 MED ORDER — DEXTROSE-NACL 5-0.9 % IV SOLN
INTRAVENOUS | Status: AC
  Administered 2018-04-26: 15:00:00 via INTRAVENOUS

## 2018-04-26 MED ORDER — ACETAMINOPHEN 650 MG RE SUPP
650.0000 mg | Freq: Four times a day (QID) | RECTAL | Status: DC | PRN
  Administered 2018-04-26: 650 mg via RECTAL
  Filled 2018-04-26: qty 1

## 2018-04-26 MED ORDER — MORPHINE SULFATE (PF) 2 MG/ML IV SOLN: 0.5000 mg | INTRAVENOUS | Status: DC | PRN

## 2018-04-26 NOTE — Progress Notes (Signed)
Daily Progress Note   Patient Name: Mark PackerLonnie E Caterino Jr.       Date: 04/26/2018 DOB: 07-Mar-1924  Age: 85 y.o. MRN#: [id number] Attending Physician: Alona BeneMathew, Jacob M, MD Primary Care Physician: Fleet ContrasAvbuere, Edwin, MD Admit Date: 04/24/2018  Reason for Consultation/Follow-up: Establishing goals of care  Subjective: Patient continues to be lethargic. Will not follow commands or open eyes to stimuli or name. Appears comfortable in no distress. Continues to be tachycardic. Bedside RN and wife also reports patient had episode in the early morning were he became somewhat agitated with mild respiratory distress including audible rhonchi. He required some suctioning due to inability to clear secretions.   Wife is is at the bedside and is tearful, she expressed she does not want him suffering. She offered his children to contact me or come up so we could have a more indepth and face to face discussion. She verbalized the son is not taking the situation well, and continues to feel there are things that should and could be done. She stated the daughter who lives locally is more accepting but did say she wanted to allow time to see how he does. Their daughter who resides in [place] was originally planning to fly in today however, Mrs. [name] reports that she expressed she was not coming because she had other questions and wanted to confirm his condition after talking with some of her medical friends. Wife expressed Mark Buttsnnette (daughter in [place]) was not speaking with her and had a few harsh words in regards to her potential decisions. Wife is tearful as she is trying to be respectful and support her stepchildren and not make decisions without them, knowing she is the final decision maker regardless. Support given. I offered to contact  daughter and speak with her again. I did speak with her on yesterday. Mrs. [name] attempted to call her and she answered and yelled, I can't talk right now and hung up. Support and comfort given.   Mrs. [name] understands her husband's condition and expressed her awareness of his wishes and goals he would want for himself. She is concerned that he is not waking up and continues to be weak, congested, and not eating or drinking. Support given. Space allowed for her to discuss her feelings openly and also share memories and previous conversations she has had  with patient. She reports she was hopeful that he would have shown further signs of improvement today after seeing him somewhat awake this morning during his episode however, once cleared he went back into his lethargic state.   Support given. Wife continues to want to wait until children are all on board and possibly come up today and visit. She is hopeful to make a decision over the next day as she does not want him suffering. She is aware to call with any further questions or needs. If their children would like to meet and discuss patient's condition or speak over the phone she has my contact information. I advised her I would reach out to them also as an additional support for her.   1545: received a call from patient's daughter [name]. She is tearful and expressing she would like an update because her sister [name] was at the bedside and patient didn't seem to be doing to well, although he did open his eyes and track her as she sat down. I came back to patient's room to assess him and also meet with family and speak with [name] (daughter in [place]) over the phone with other family in room. Patient continues to be lethargic. Wife, daughter [name] and her her husband at the bedside. [name] expressed she was hopeful when she saw her dad open his eyes however, he is lethargic again, and does not reciprocate when she touches him or calls his name. She placed her hand  on his face and stated he would generally wake up, say a few words, or move her hand out of agitation. She is tearful and stating "he is doing well, he doesn't wake up and he looks so weak and tired!" support given. Daughter [name] on the phone and is tearful. I discussed at length concerns with his hypotension, tachycardia, nutritional state, and overall functional condition. Family tearful and verbalized understanding. Daughter from [place], tearful and appreciative for conversation. She expressed her concern with not flying in today was because the airline no longer give open dates for family emergencies and requires a return date. Her sister expressed to her all that matter is for her to get here ASAP and family would make sure she gets back home. She verbalized understanding and expressed she is going to try to get her tomorrow if not tonight. I did alert family given patient's condition anything could happen at anytime. They verbalized understanding. Family is hopeful patient will and can hold on until tomorrow. Patient's son is not present and family express he is having a hard time and grieving in his own way. All family, wife, 2 daughters and son-in-law are realistic in understanding patient's condition and prognosis. They mutually agree on no feeding tube or artificial feeding, DNR/DNI, and once family is here most likely will transition to full comfort care. Daughters asked appropriate questions regarding comfort care measures and also about hospice. They expressed if patient did not die in the hospital their wishes would be for him to transition to hospice home for EOL. Family is tearful and spent time expressing the memories and times shared. Daughter stated she does not want him to suffer or be connected to all the medial devices, but to hopefully have the opportunity to pass peacefully once all children are present. Support given. Family aware I will follow back up tomorrow morning for updates and  available if needed.   Length of Stay: 2  Current Medications: Scheduled Meds:  . aspirin  300 mg Rectal  Daily    Continuous Infusions:   PRN Meds: acetaminophen, nitroGLYCERIN, ondansetron (ZOFRAN) IV  Physical Exam Vitals signs and nursing note reviewed.  Constitutional:      Appearance: He is ill-appearing.  Cardiovascular:     Rate and Rhythm: Tachycardia present. Rhythm irregular.     Pulses: Normal pulses.  Pulmonary:     Breath sounds: Decreased breath sounds and rhonchi present.  Abdominal:     General: Bowel sounds are decreased.  Skin:    General: Skin is warm and dry.  Neurological:     Mental Status: He is lethargic.     Motor: Weakness and atrophy present.            Vital Signs: BP 99/72 (BP Location: Right Arm)   Pulse (!) 117   Temp 99.1 F (37.3 C) (Oral)   Resp (!) 24   Ht  (1.6 m)   Wt 60.5 kg   SpO2 91%   BMI 23.61 kg/m  SpO2: SpO2: 91 % O2 Device: O2 Device: Room Air O2 Flow Rate:    Intake/output summary:   Intake/Output Summary (Last 24 hours) at 04/26/2018 1311 Last data filed at 04/26/2018 0400 Gross per 24 hour  Intake 626 ml  Output 900 ml  Net -274 ml   LBM: Last BM Date: 04/23/18 Baseline Weight: Weight: 56.7 kg Most recent weight: Weight: 60.5 kg       Palliative Assessment/Data: LETHARGIC/NPO    Flowsheet Rows     Most Recent Value  Intake Tab  Referral Department  Cardiology  Unit at Time of Referral  Cardiac/Telemetry Unit  Palliative Care Primary Diagnosis  Cardiac  Date Notified  04/25/18  Palliative Care Type  New Palliative care  Reason for referral  Clarify Goals of Care  Date of Admission  04/24/18  Date first seen by Palliative Care  04/25/18  # of days Palliative referral response time  0 Day(s)  # of days IP prior to Palliative referral  1  Clinical Assessment  Psychosocial & Spiritual Assessment  Palliative Care Outcomes      Patient Active Problem List   Diagnosis Date Noted  . STEMI (ST  elevation myocardial infarction) (HCC) 04/24/2018  . CAD (coronary artery disease) of artery bypass graft 11/24/2012    Class: Chronic  . S/P aortic valve replacement with bioprosthetic valve 11/24/2012    Class: Chronic  . Amputation, toe, traumatic (HCC) 07/04/2011  . Hematoma of groin 06/19/2011  . Atherosclerosis of native arteries of the extremities with ulceration(440.23) 06/19/2011  . Other complications due to other vascular device, implant, and graft 06/19/2011  . PVD (peripheral vascular disease) with claudication (HCC) 06/05/2011    Palliative Care Assessment & Plan   Patient Profile: 85 y.o. male admitted on 04/24/2018 from home with complaints of increased weakness and several episodes of vomiting. 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 During ED visit wife reported patient does not communicate and requires 24/7 care. She reported patient having multiple episodes of vomiting starting early morning. EMS was called and EKG showed inferior STEMI, and confirmed. Cr 1.66, K 5.6, Head CT showed no acute abnormalities. Cardiology notified and per Dr. Katrinka Blazing patient's primary cardiologist, patient only appropriate for medical management at this time. Since admission he  continues on IV heparin. He remains lethargic and does not follow commands. He is unable to take anything by mouth and is receiving ASA via suppository. Palliative Medicine team consulted for goals of care.   Recommendations/Plan:  DNR/DNI  Continue to treat with no escalation of care. Family aware of risk of fluid overload. Ok with limited fluids with hopes of increasing blood pressure however, aware of risk also. All family members  verbalized understanding of poor prognosis and wishes are for no PEG/artificial feeding. They remain hopeful daughter from [place] will arrive prior to patient passing.  Family leaning towards comfort measures pending daughters arrival from [place]. She is expected to get here tomorrow. She did not originally come today as planned. Also discussed the chances patient could expire prior to her arrival and also if she was unable to arrive tomorrow family considering moving forward and not prolonging.   Family aware low dose morphine available for symptom support in setting of pain or shortness of breath.   PMT will continue to support and follow.   Goals of Care and Additional Recommendations:  Limitations on Scope of Treatment: Continue with current treatment, no escalation, no PEG/artificial feeding   Code Status:    Code Status Orders  (From admission, onward)         Start     Ordered   04/24/18 1750  Do not attempt resuscitation (DNR)  Continuous    Question Answer Comment  In the event of cardiac or respiratory ARREST Do not call a "code blue"   In the event of cardiac or respiratory ARREST Do not perform Intubation, CPR, defibrillation or ACLS   In the event of cardiac or respiratory ARREST Use medication by any route, position, wound care, and other measures to relive pain and suffering. May use oxygen, suction and manual treatment of airway obstruction as needed for comfort.      04/24/18 1749        Code Status History    Date Active Date Inactive Code Status Order ID Comments User Context   04/24/2018 1633 04/24/2018 1749 DNR [id number]  Manson Passey, PA ED   08/04/2014 1522 08/04/2014 2304 Full Code [id number]  Sherren Kerns, MD Inpatient   06/05/2011 1723 06/13/2011 1648 Full Code [id number]  Ave Filter, RN Inpatient   05/05/2011 1703 05/13/2011 2153 Full Code [id number]  Sebastian Ache, RN Inpatient    Advance Directive Documentation     Most Recent Value    Type of Advance Directive  Out of facility DNR (pink MOST or yellow form), Healthcare Power of Attorney  Pre-existing out of facility DNR order (yellow form or pink MOST form)  -  "MOST" Form in Place?  -     Prognosis:   Guarded to Poor   Discharge Planning:  To Be Determined  Care plan was discussed with patient's family (wife, 2 daughters) and Retail banker.   Thank you for allowing the Palliative Medicine Team to assist in the care of this patient.   Time In: 1130 1545 Time Out: 1230 1630 Total Time 105 min. Prolonged Time Billed  YES        Greater than 50%  of this time was spent counseling and coordinating care related to the above assessment and plan.  Willette Alma, AGPCNP-BC Palliative Medicine Team  Pager: [phone] Amion: Thea Alken   Please contact Palliative Medicine Team phone at [phone] for questions and concerns.

## 2018-04-26 NOTE — Progress Notes (Signed)
Pt with mild resp distress noted- audible ronchi in thorat/upper airway-pt unable to cough on command. Attempt to oral suction with both Yaunker and suction catheter-very small amount of thick tan sputum obtained. MD on call paged with order for NTS suction. Respiratory aware. Dierdre Highman, RN

## 2018-04-26 NOTE — Progress Notes (Signed)
Echocardiogram 2D Echocardiogram has been performed.  [name] 04/26/2018, 11:20 AM

## 2018-04-26 NOTE — Progress Notes (Addendum)
  ANTICOAGULATION CONSULT NOTE   Pharmacy Consult for Heparin Indication: chest pain/ACS  Patient Measurements: Height: 5\' 3"  (160 cm) Weight: 133 lb 4.8 oz (60.5 kg) IBW/kg (Calculated) : 56.9 Heparin Dosing Weight: 56.7 kg  Vital Signs: Temp: 99.9 F (37.7 C) (03/02 0800) Temp Source: Axillary (03/02 0800) BP: 99/72 (03/02 0800) Pulse Rate: 117 (03/02 0800)  Labs: Recent Labs    04/24/18 1519  04/25/18 0059 04/25/18 0656 04/25/18 1033 04/26/18 0407  HGB 11.3*  --   --  10.5*  --  10.7*  HCT 34.1*  --   --  32.2*  --  32.6*  PLT 173  --   --  187  --  180  HEPARINUNFRC  --   --  0.65  --  0.39 0.20*  CREATININE 1.66*  --   --  1.75*  --   --   TROPONINI  --    < > <0.03 0.03*  --  0.11*   < > = values in this interval not displayed.     Medical History: Past Medical History:  Diagnosis Date  . Amputation of great toe, left, traumatic (HCC)   . Arthritis   . Asthma    as a young child  . Blind right eye    "since age 64-4; S/P whooping cough"  . BPH (benign prostatic hypertrophy)    Dr.Nesi is urologist  . Bruises easily    takes Pletal and ASA daily  . CAD (coronary artery disease)   . Claudication (HCC)   . Colitis    hx of  . Dementia   . Diabetes mellitus    borderline diabetic  . Gastritis   . Heart murmur   . History of shingles 3-27yrs ago  . Hx of colonic polyps   . Hyperlipidemia    takes Atorvastatin daily  . Hypertension    takes Ramipril and Metoprolol daily  . Insomnia    takes Ambien nightly  . Mental disorder    takes Aricept daily  . Palpitations    we need to exclude atrial fib and ventricular arrhythmia.  . Peripheral vascular disease (HCC)   . Urinary urgency    takes Flomax daily    Assessment: 85 yo male admitted with fever and emesis. Originally called as a Code STEMI which was cancelled. Now managing medically. On IV heparin.   HL subtherapeutic this AM at 0.2 on 700 units/hr. H/H down slightly. Plt wnl. No s/sx of  bleeding per nurse.   Goal of Therapy:  Heparin level 0.3-0.7 units/ml Monitor platelets by anticoagulation protocol: Yes   Plan:  -Increase heparin drip to 900 units/hr -Monitor daily HL, CBC, and s/sx of bleeding  -F/u plans to d/c heparin   Gwynneth Albright, Ilda Basset D PGY1 Pharmacy Resident  Phone [phone] 04/26/2018   8:20 AM   -----  Addendum:   -Heparin drip discontinued

## 2018-04-26 NOTE — Progress Notes (Addendum)
    Family at the bedside. Long discussion per Dr. Herbie Baltimore regarding next steps. Wife is leaning towards comfort care at this time. Patient is lethargic, HR 110s and Bp 75 systolic. Waiting for daughter to arrive from out of state. Palliative care seeing. DNR/DNI. Will give a few hours of IVFs, given soft blood pressure, cautious not to volume overload. Follow up with palliative rec's today.   Janice Coffin, NP-C 04/26/2018, 2:01 PM Pager: [phone]    ATTENDING ATTESTATION  I have seen, examined and evaluated the patient this PM along with Laverda Page, NP-C.  After reviewing all the available data and chart, we discussed the patients laboratory, study & physical findings as well as symptoms in detail. I agree with her brief note.  Interestingly, there is Inferior WMA, but preserved LVEF & minimal if any + Troponin -- confusing given extent of STE. I do not think that this is c/w true inferior STEMI - ? Brief episodes of transient ischemia.  No plans for further evaluation -- D/c Heparin & ASA.  Plan is to move to Palliative / comfort care - OK for gentle IVF while we are waiting for family members to arrive & Palliative Care Team to return.  - ultimately, would not do IVF or feedings.   Await recommendations from  Palliative Medicine Team.  25 min with patient & wife.      Bryan Lemma, M.D., M.S. Interventional Cardiologist   Pager # [phone] Phone # [phone] [address] [place], [place] [address]

## 2018-04-27 DIAGNOSIS — R9431 Abnormal electrocardiogram [ECG] [EKG]: Secondary | ICD-10-CM | POA: Diagnosis present

## 2018-04-27 DIAGNOSIS — R4182 Altered mental status, unspecified: Secondary | ICD-10-CM | POA: Diagnosis present

## 2018-04-27 DIAGNOSIS — Z953 Presence of xenogenic heart valve: Secondary | ICD-10-CM

## 2018-04-27 DIAGNOSIS — I25810 Atherosclerosis of coronary artery bypass graft(s) without angina pectoris: Secondary | ICD-10-CM

## 2018-04-27 DIAGNOSIS — F0390 Unspecified dementia without behavioral disturbance: Secondary | ICD-10-CM | POA: Diagnosis present

## 2018-04-27 DIAGNOSIS — I4891 Unspecified atrial fibrillation: Secondary | ICD-10-CM

## 2018-04-27 LAB — CBC
HCT: 28.8 % — ABNORMAL LOW (ref 39.0–52.0)
Hemoglobin: 9.3 g/dL — ABNORMAL LOW (ref 13.0–17.0)
MCH: 28.7 pg (ref 26.0–34.0)
MCHC: 32.3 g/dL (ref 30.0–36.0)
MCV: 88.9 fL (ref 80.0–100.0)
Platelets: 157 10*3/uL (ref 150–400)
RBC: 3.24 MIL/uL — AB (ref 4.22–5.81)
RDW: 13.2 % (ref 11.5–15.5)
WBC: 8.1 10*3/uL (ref 4.0–10.5)
nRBC: 0 % (ref 0.0–0.2)

## 2018-04-27 MED ORDER — AMIODARONE HCL IN DEXTROSE 360-4.14 MG/200ML-% IV SOLN: 30.0000 mg/h | INTRAVENOUS | Status: DC

## 2018-04-27 MED ORDER — AMIODARONE LOAD VIA INFUSION
150.0000 mg | Freq: Once | INTRAVENOUS | Status: AC
  Administered 2018-04-27: 150 mg via INTRAVENOUS
  Filled 2018-04-27: qty 83.34

## 2018-04-27 MED ORDER — MORPHINE SULFATE (PF) 2 MG/ML IV SOLN: 1.0000 mg | INTRAVENOUS | Status: DC | PRN

## 2018-04-27 MED ORDER — BIOTENE DRY MOUTH MT LIQD: 15.0000 mL | OROMUCOSAL | Status: DC | PRN

## 2018-04-27 MED ORDER — POLYVINYL ALCOHOL 1.4 % OP SOLN
1.0000 [drp] | Freq: Four times a day (QID) | OPHTHALMIC | Status: DC | PRN
  Filled 2018-04-27: qty 15

## 2018-04-27 MED ORDER — GLYCOPYRROLATE 0.2 MG/ML IJ SOLN: 0.3000 mg | INTRAMUSCULAR | Status: DC | PRN

## 2018-04-27 MED ORDER — GLYCOPYRROLATE 0.2 MG/ML IJ SOLN
0.3000 mg | Freq: Once | INTRAMUSCULAR | Status: AC
  Administered 2018-04-27: 0.3 mg via INTRAVENOUS
  Filled 2018-04-27: qty 2

## 2018-04-27 MED ORDER — SCOPOLAMINE 1 MG/3DAYS TD PT72
1.0000 | MEDICATED_PATCH | TRANSDERMAL | Status: DC
  Administered 2018-04-27: 1.5 mg via TRANSDERMAL
  Filled 2018-04-27: qty 1

## 2018-04-27 MED ORDER — AMIODARONE HCL IN DEXTROSE 360-4.14 MG/200ML-% IV SOLN
60.0000 mg/h | INTRAVENOUS | Status: DC
  Administered 2018-04-27: 60 mg/h via INTRAVENOUS

## 2018-04-27 MED ORDER — HALOPERIDOL LACTATE 5 MG/ML IJ SOLN: 0.5000 mg | INTRAMUSCULAR | Status: DC | PRN

## 2018-04-27 NOTE — Progress Notes (Signed)
Progress Note  Patient Name: [name]. Date of Encounter: 04/27/2018  Primary Cardiologist: Belva Crome III, MD   Subjective   Resting in bed, gurgling breath sounds.  Now tachycardic in A. Fib.  Seen by [place] (PMT) yesterday.  Major social issues with second wife and her stepchildren (patient's children-2 daughters).  Daughter in [place] apparently is now not coming but the one who lives locally was still not yet excepting of his condition.  Based on discussion with the wife, the patient clearly is DNR DNI, with plans to move toward comfort measures/palliative care.   Inpatient Medications    Scheduled Meds: . amiodarone  150 mg Intravenous Once   Continuous Infusions: . amiodarone     Followed by  . amiodarone     PRN Meds: acetaminophen, morphine injection, nitroGLYCERIN, ondansetron (ZOFRAN) IV   Vital Signs    Vitals:   04/26/18 2209 04/27/18 0022 04/27/18 0524 04/27/18 0757  BP: (!) 101/52 (!) 87/44 (!) 115/50 (!) 115/59  Pulse: (!) 52 88 62   Resp: (!) 29 (!) 33 (!) 41   Temp: 99.2 F (37.3 C) 98.4 F (36.9 C) 98.2 F (36.8 C) 98.8 F (37.1 C)  TempSrc: Axillary Axillary Axillary Axillary  SpO2: 92% 100% 100%   Weight:   57.5 kg   Height:        Intake/Output Summary (Last 24 hours) at 04/27/2018 1247 Last data filed at 04/27/2018 0940 Gross per 24 hour  Intake 0 ml  Output 600 ml  Net -600 ml   Last 3 Weights 04/27/2018 04/26/2018 04/25/2018  Weight (lbs) 126 lb 12.2 oz 133 lb 4.8 oz 131 lb 6.3 oz  Weight (kg) 57.5 kg 60.464 kg 59.6 kg      Telemetry    A. fib rates in the 130s to 160s- Personally Reviewed  ECG    Not checked- Personally Reviewed  Physical Exam   GEN: Lying in bed, chronically ill-appearing.  Opens eyes on is somewhat responsive, but not coherent.  Thin and frail. Neck:  Mild JVD. Cardiac:  Rapid rate with irregularly irregular rhythm.,  Soft SEM at RUSB.  Difficult to assess for rubs or gallops  due to tachycardia.  Respiratory:  Diffuse guttural breath sounds from upper respiratory phlegm. GI: Soft, nontender, non-distended  MS: No edema; No deformity.  Psych: Awake but not oriented to person place or time.  Not following commands.  Labs    Chemistry Recent Labs  Lab 04/24/18 1519 04/25/18 0656  NA 136 137  K 5.6* 4.5  CL 97* 103  CO2 28 24  GLUCOSE 163* 195*  BUN 40* 36*  CREATININE 1.66* 1.75*  CALCIUM 9.4 8.5*  PROT 7.4  --   ALBUMIN 3.7  --   AST 23  --   ALT 16  --   ALKPHOS 109  --   BILITOT 0.6  --   GFRNONAA 35* 33*  GFRAA 41* 39*  ANIONGAP 11 10     Hematology Recent Labs  Lab 04/25/18 0656 04/26/18 0407 04/27/18 0335  WBC 5.2 7.1 8.1  RBC 3.63* 3.71* 3.24*  HGB 10.5* 10.7* 9.3*  HCT 32.2* 32.6* 28.8*  MCV 88.7 87.9 88.9  MCH 28.9 28.8 28.7  MCHC 32.6 32.8 32.3  RDW 12.9 13.0 13.2  PLT 187 180 157    Cardiac Enzymes Recent Labs  Lab 04/24/18 1848 04/25/18 0059 04/25/18 0656 04/26/18 0407  TROPONINI <0.03 <0.03 0.03* 0.11*    Recent Labs  Lab 04/24/18 1529  TROPIPOC 0.00     BNPNo results for input(s): BNP, PROBNP in the last 168 hours.   DDimer No results for input(s): DDIMER in the last 168 hours.   Radiology    No results found.  Cardiac Studies   2D Echo 3 03/28/2018: Normal LV size and function.  EF 55 to 60%.  GRII DD (pseudo-normal filling).  Basal and mid inferior-inferoseptal akinesis.  Bioprosthetic AVR in place.  Minimal gradient.  Degenerative mitral valve with severe MAC).  Patient Profile     85 y.o. male with longstanding history of CAD status post AVR as well as PAD previously followed by Dr. Oneida Alar.  He also has hypertension and diabetes with advanced dementia who presented with code STEMI, however only had minimal troponin elevation which was not consistent with STEMI.  He does have wall motion normality consistent with prior inferior infarct, but not sure if this is new.  Assessment & Plan      Principal Problem:   Altered mental status Active Problems:   Rapidly progressive dementia (HCC)   CAD (coronary artery disease) of artery bypass graft   S/P aortic valve replacement with bioprosthetic valve   ST elevation -without myocardial infarction   Atrial fibrillation with rapid ventricular response ([place])  Had a long talk with wife yesterday.  Confirmed that the patient is DNR/DNI.  PNT seeing the patient, however we have not yet concluded the plans for going forward.  There is been issues with the family.  Currently his blood pressures are borderline and he is now in A. fib RVR.  Simply to slow his heart rate down I will give him amiodarone IV load tonight but would not plan on amiodarone going forward.  With minimal troponin elevation, I do not see any reason to continue any IV heparin.  This was stopped yesterday.  Blood pressure would not tolerate his home amlodipine and metoprolol.   Did not plan to do IV fluids or feedings with a PEG tube or nasogastric tube.  As such, I suspect that we do need to move toward palliative/comfort care.  Awaiting assistance and guidance from PMT team as to further orders, and plans for discharge to potentially inpatient hospice per the wife's wishes. --He was on Risperdal at home, I do not think he needs that here.  He is currently getting IV the morphine as needed for discomfort or agitation.  Would like more guidance from PMT team.  With the exception of a one-time IV load of amiodarone for his atrial fibrillation, we are not doing any significant aggressive management of his coronary disease, A. fib or valvular disease.  Basically he is now extreme failure to thrive with progressively worsening mental status and with underlying dementia.  With out taking any adequate p.o., I do not see him surviving long.  Prognosis is very poor.  The wife is well aware of this prognosis and is interested in moving forward with hospice, and per discussion  likely inpatient.      For questions or updates, please contact [place] Please consult [email or web address] for contact info under        Signed, Glenetta Hew, MD  04/27/2018, 12:47 PM

## 2018-04-27 NOTE — Discharge Summary (Addendum)
Discharge Summary    Patient ID: [name].,  MRN: [id number], DOB/AGE: [birth year] 85 y.o.  Admit date: 04/24/2018 Discharge date: 04/28/2018  Primary Care Provider: Fleet Contras Primary Cardiologist: Dr. Katrinka Blazing  Discharge Diagnoses    Principal Problem:   Altered mental status Active Problems:   CAD (coronary artery disease) of artery bypass graft   S/P aortic valve replacement with bioprosthetic valve   ST elevation -without myocardial infarction   Atrial fibrillation with rapid ventricular response (HCC)   Rapidly progressive dementia (HCC)   Failure to thrive in adult   Allergies Allergies  Allergen Reactions  . Shrimp [Shellfish Allergy] Nausea And Vomiting    "haven't eaten any since 1980's" pt only allergic to shrimp. Not shellfish    Diagnostic Studies/Procedures    Cath: 04/26/2018  IMPRESSIONS    1. The left ventricle has normal systolic function, with an ejection fraction of 55-60%. The cavity size was normal. There is mildly increased left ventricular wall thickness. Left ventricular diastolic Doppler parameters are consistent with  pseudonormalization Elevated left atrial and left ventricular end-diastolic pressures. There is basal and mid inferior and inferoseptal akinesis.  2. The right ventricle has normal systolic function. The cavity was normal. There is no increase in right ventricular wall thickness.  3. Left atrial size was mildly dilated.  4. A Bioprosthetic AVR valve is present in the aortic position. Normal aortic valve prosthesis. AV Area (VTI): 1.60 cm, AV Mean Grad: 8.5 mmHg, LVOT/AV VTI ratio: 0.51. Aortic valve regurgitation is trivial by color flow Doppler.  5. The mitral valve is degenerative. Mild thickening of the mitral valve leaflet. Mild calcification of the mitral valve leaflet. There is severe mitral annular calcification present.  6. The tricuspid valve is normal in structure.  7. The pulmonic valve was normal in  structure. _____________   History of Present Illness     85 y.o. male with history of CAD, aortic valve replacement in 2005, peripheral arterial disease followed by Dr. Darrick Penna, hypertension, diabetes and advanced dementia presented with code STEMI.  Hx of PAD s/p right axillary bifemoral bypass as well as a right femoral to below-knee popliteal bypass. The axbifem was a Dacron graft. The femoropopliteal was a PTFE graft. S/p right subclavian stent.   Last echocardiogram in 2013 showed normal LV function, grade 2 diastolic dysfunction.  [name] has advanced dementia and lives with wife at home. Wheelchair-bound.  Does not communicate.  Some of his family member take care of him 24/7.  He was in usual state of health up until 6 AM the day of admission when he had vomiting.  He had multiple episode of vomiting afterwards and complained of "not feeling better".  EMS was called and found to have inferior STEMI and brought to ER. Dr. Wyline Mood has spoke with Dr. Katrinka Blazing who is a primary cardiologist.  Plan medical management.  Wife at bedside agree with medical management and confirm DNR status.  Unable to get review of systems as patient had severe dementia. Potassium 5.6.  Creatinine 1.66.  Hemoglobin 11.3.  CT of head without acute abnormality.  Hospital Course     Consultants: Palliative Care  He was admitted and placed on IV heparin. Trop was initially negative but then increased to 0.11 the following morning. Placed on medical therapy with ASA, statin, plavix and toprol. He continued to decline. Stopped eating. Unable to take oral meds. Palliative medicine was consulted. Family attempted to wait for children to arrive from  out of state but decision was made by his wife to transition him to comfort care and confirmed DNR/DNI status. His blood pressures declined and given brief IVFs. Also developed Afib RVR and dosed with 150mg  IV amiodarone bolus with improved rates. Confirmed with wife no  further IVFs/or PEG tube. Palliative arranged for transfer to [facility] place with wife in agreement. Given his failure to thrive and lack of PO intake his overall prognosis was poor. Social worker consulted and confirmed placement at [facility] place. All home medications stopped prior to transfer.   [name]. was seen by Dr. Herbie Baltimore and determined stable for discharge home. Follow up in the office has been arranged. Medications are listed below.   _____________  Discharge Vitals Blood pressure (!) 149/79, pulse 93, temperature 98.4 F (36.9 C), temperature source Axillary, resp. rate (!) 41, height 5\' 3"  (1.6 m), weight 57.5 kg, SpO2 97 %.  Filed Weights   04/25/18 0426 04/26/18 0411 04/27/18 0524  Weight: 59.6 kg 60.5 kg 57.5 kg    Labs & Radiologic Studies    CBC Recent Labs    04/26/18 0407 04/27/18 0335  WBC 7.1 8.1  HGB 10.7* 9.3*  HCT 32.6* 28.8*  MCV 87.9 88.9  PLT 180 157   Basic Metabolic Panel No results for input(s): NA, K, CL, CO2, GLUCOSE, BUN, CREATININE, CALCIUM, MG, PHOS in the last 72 hours. Liver Function Tests No results for input(s): AST, ALT, ALKPHOS, BILITOT, PROT, ALBUMIN in the last 72 hours. No results for input(s): LIPASE, AMYLASE in the last 72 hours. Cardiac Enzymes Recent Labs    04/26/18 0407  TROPONINI 0.11*   BNP Invalid input(s): POCBNP D-Dimer No results for input(s): DDIMER in the last 72 hours. Hemoglobin A1C No results for input(s): HGBA1C in the last 72 hours. Fasting Lipid Panel No results for input(s): CHOL, HDL, LDLCALC, TRIG, CHOLHDL, LDLDIRECT in the last 72 hours. Thyroid Function Tests No results for input(s): TSH, T4TOTAL, T3FREE, THYROIDAB in the last 72 hours.  Invalid input(s): FREET3 _____________  Ct Head Wo Contrast  Result Date: 04/24/2018 CLINICAL DATA:  Altered mental status EXAM: CT HEAD WITHOUT CONTRAST TECHNIQUE: Contiguous axial images were obtained from the base of the skull through the vertex without  intravenous contrast. COMPARISON:  01/24/2018 FINDINGS: Brain: No evidence of acute infarction, hemorrhage, extra-axial collection, ventriculomegaly, or mass effect. Small old left thalamus lacunar infarct. Generalized cerebral atrophy. Periventricular white matter low attenuation likely secondary to microangiopathy. Vascular: Cerebrovascular atherosclerotic calcifications are noted. Skull: Negative for fracture or focal lesion. Sinuses/Orbits: Right phthisis bulbi. Mastoid sinuses are clear. Bilateral maxillary sinus air-fluid levels as can be seen with sinusitis. Other: None. IMPRESSION: 1. No acute intracranial pathology. 2. Bilateral maxillary sinus air-fluid levels as can be seen with sinusitis. Electronically Signed   By: Elige Ko   On: 04/24/2018 16:33   Dg Chest Port 1 View  Result Date: 04/24/2018 CLINICAL DATA:  Altered mental status, lethargy EXAM: PORTABLE CHEST 1 VIEW COMPARISON:  03/31/2017 FINDINGS: Low lung volumes. Mild bilateral interstitial thickening. Hazy left lower lobe airspace disease which may reflect atelectasis versus pneumonia. No pleural effusion or pneumothorax. Stable cardiomediastinal silhouette. Prior CABG and aortic valve replacement. No acute osseous abnormality. IMPRESSION: Hazy left lower lobe airspace disease which may reflect atelectasis versus pneumonia. Electronically Signed   By: Elige Ko   On: 04/24/2018 15:53   Disposition   Pt is being discharged home today in good condition.  Follow-up Plans & Appointments  Transfer to [facility].    Discharge Medications     Medication List    STOP taking these medications   acetaminophen 500 MG tablet Commonly known as:  TYLENOL   amLODipine 5 MG tablet Commonly known as:  NORVASC   finasteride 5 MG tablet Commonly known as:  PROSCAR   furosemide 20 MG tablet Commonly known as:  LASIX   glycopyrrolate 2 MG tablet Commonly known as:  ROBINUL   metoprolol succinate 50 MG 24 hr  tablet Commonly known as:  TOPROL-XL   nitroGLYCERIN 0.4 MG SL tablet Commonly known as:  NITROSTAT   ramipril 5 MG capsule Commonly known as:  ALTACE   risperiDONE 0.5 MG tablet Commonly known as:  RISPERDAL   sertraline 25 MG tablet Commonly known as:  ZOLOFT       Acute coronary syndrome (MI, NSTEMI, STEMI, etc) this admission?: No.     Outstanding Labs/Studies   N/a  Duration of Discharge Encounter   Greater than 30 minutes including physician time.  Signed, Laverda Page NP-C 04/28/2018, 11:14 AM   I personally saw and evaluated the patient today.  A little bit more alert and oriented today, but still has diffuse coarse breath sounds with a rattling sensation.  Is tachycardic intermittently.  Amiodarone last night did help some, but was only a short-term fix.  Basically with worsening dementia and failure to thrive, the plan is been to move toward palliative/hospice care.  He is to be discharged today to inpatient hospice service.  Agree with discharge summary.   Bryan Lemma, MD

## 2018-04-27 NOTE — Social Work (Addendum)
4:21 pm [facility] will have a bed available for patient tomorrow. Updated NP.   1:56 pm CSW received consult for residential hospice placement. Met with patient's wife, Altha Harm and daughter, Jeani Hawking, at bedside. Patient asleep. Discussed wishes hospice care. Family prefers [facility].  CSW made referral to [place] at [place]. Awaiting follow up and [facility] bed availability.  Estanislado Emms, LCSW [phone]

## 2018-04-27 NOTE — Progress Notes (Signed)
Manufacturing engineer [facility]) Hospital Liaison note.   Received request from [name], [place] for family interest in [facility] with request for transfer 04/28/2018. Chart reviewed and eligibility confirmed. Met with patient and family to confirm interest and explain services. Family agreeable to transfer tomorrow. CSW aware.  Registration paper work completed. Dr. Nolene Ebbs to assume care per family request.  Please fax discharge summary to [phone]. RN please call report to [phone]. Please arrange transport for patient to arrive before noon if possible.    Thank you,      Farrel Gordon, RN, [facility]   [place]     [place] are on AMION

## 2018-04-27 NOTE — Progress Notes (Signed)
Daily Progress Note   Patient Name: [name].       Date: 04/27/2018 DOB: 08/11/1924  Age: 85 y.o. MRN#: [id number] Attending Physician: Alona Bene, MD Primary Care Physician: Fleet Contras, MD Admit Date: 04/24/2018  Reason for Consultation/Follow-up: Establishing goals of care, Non pain symptom management, Pain control, Psychosocial/spiritual support and Withdrawal of life-sustaining treatment  Subjective: Patient still somewhat lethargic but more arousable and alert on today. He opens eyes to name and touch. When asked if having pain he responded "no", but then closes eyes. Heart rate continues to be elevated in 130-140's with a few noticeable episodes in 160s. Cardiology has ordered amiodarone for control. Patient also has audible secretions and use of accessory muscles. His daughter, [name] and wife, [name] is at the bedside.   I discussed in detail with family continuous signs of decline and now increased signs of distress. Wife is tearful and expressed she no longer wants him to suffer. She reports she had decided that she want him to be kept comfortable and considered for placement at [facility]. [name] verbalized understanding and agreement. She is tearful and stated she doesn't like watching the monitor keep beeping and showing his heart rate being high and she is concerned with how fast he is breathing. Support given.   I again discussed comfort care and [facility] in detail with family members. Both [name] and wife, [name] verbalized plans for comfort and support for EOL care. [name] reports her brother came up to visit late yesterday evening and seemed to be coming more to terms with his father's condition, however, he only stayed a short time and was tearful and had to  leave. She reported her sister [name] is somewhat coming around however, due to cost is not able to fly in from [place] until Monday. They are aware that patient could potentially pass away prior to then.  Family understands all care and medications will transition with a focus on comfort. I educated family on current noticeable symptoms and management for respiratory distress and secretions. Wife request to continue with dose of amiodarone for HR control. She understands we will not initiate a drip but administer a one time dose. We discussed what comfort care will look like. Family aware cardiac monitor will be discontinued, no continuous IV fluids, and medications  will be given to assist with symptom management and comfort. Wife concerned that patient did request earlier a sip of water. We discussed the risk of aspiration. They are aware. Family aware patient may have comfort feeds and this should occur only if he is awake and alert, along with head of bed raised and in small amounts. Family verbalized understanding and appreciation.   [name], patient's daughter called to get updates and provided her with all updates on patient's status and plans to transfer to [facility] once accepted and bed is available. She had several questions and was tearful. She openly expressed "I am the fighter in the family, and although everyone else is ok with given in, I have to make sure I have all the information and a full understanding that there are not other options that will get my father back to his baseline." I discussed in details his condition and care that he has received with attempts to manage his current illness. I also emphasized patient's overall decline in strength, along with poor nutritional state and how he is not and will not take po's to sustain life. She was tearful but verbalized understanding. She expressed she is not able to get a flight until Monday and once she arrives her intentions are to stay for 2  weeks. Again advised her as I did other family, as providers we can provide a prognosis and medical picture but could not guarantee her father would not pass away prior to her arrival especially given his current state (tachycardia, tachypnea, congestion/secretions, and poor nutritional intake with no food or po's in over a week). She verbalized understanding and expressed she would continue to seek affordable flights but remains hopeful he will survive until she arrived. We discussed in details hospice/[facility] and their care. Daughter verbalized understanding and appreciation.   Length of Stay: 3  Current Medications: Scheduled Meds:  . amiodarone  150 mg Intravenous Once  . glycopyrrolate  0.3 mg Intravenous Once  . scopolamine  1 patch Transdermal Q72H    Continuous Infusions:   PRN Meds: acetaminophen, antiseptic oral rinse, glycopyrrolate, haloperidol lactate, morphine injection, nitroGLYCERIN, ondansetron (ZOFRAN) IV, polyvinyl alcohol  Physical Exam Vitals signs and nursing note reviewed.  Constitutional:      Appearance: He is ill-appearing.  Cardiovascular:     Rate and Rhythm: Tachycardia present. Rhythm irregular.     Pulses: Normal pulses.  Pulmonary:     Effort: Tachypnea present.     Breath sounds: Rhonchi present.     Comments: Audible congestion  Abdominal:     General: Bowel sounds are decreased.     Palpations: Abdomen is soft.  Skin:    General: Skin is warm and dry.  Neurological:     Mental Status: He is alert and easily aroused.     Motor: Weakness and atrophy present.     Comments: Sleeping but does awaken and moves extremities, will not follow commands             Vital Signs: BP (!) 115/59 (BP Location: Right Arm)   Pulse 62   Temp 98.8 F (37.1 C) (Axillary)   Resp (!) 41   Ht 5\' 3"  (1.6 m)   Wt 57.5 kg   SpO2 100%   BMI 22.46 kg/m  SpO2: SpO2: 100 % O2 Device: O2 Device: Room Air O2 Flow Rate:    Intake/output summary:    Intake/Output Summary (Last 24 hours) at 04/27/2018 1418 Last data filed at 04/27/2018 0940  Gross per 24 hour  Intake 0 ml  Output 600 ml  Net -600 ml   LBM: Last BM Date: 04/23/18 Baseline Weight: Weight: 56.7 kg Most recent weight: Weight: 57.5 kg       Palliative Assessment/Data: COMFORT MEASURES/NPO     Flowsheet Rows     Most Recent Value  Intake Tab  Referral Department  Cardiology  Unit at Time of Referral  Cardiac/Telemetry Unit  Palliative Care Primary Diagnosis  Cardiac  Date Notified  04/25/18  Palliative Care Type  New Palliative care  Reason for referral  Clarify Goals of Care  Date of Admission  04/24/18  Date first seen by Palliative Care  04/25/18  # of days Palliative referral response time  0 Day(s)  # of days IP prior to Palliative referral  1  Clinical Assessment  Psychosocial & Spiritual Assessment  Palliative Care Outcomes      Patient Active Problem List   Diagnosis Date Noted  . ST elevation -without myocardial infarction 04/27/2018  . Altered mental status 04/27/2018  . Atrial fibrillation with rapid ventricular response (HCC) 04/27/2018  . Rapidly progressive dementia (HCC) 04/27/2018  . STEMI (ST elevation myocardial infarction) (HCC) 04/24/2018  . CAD (coronary artery disease) of artery bypass graft 11/24/2012    Class: Chronic  . S/P aortic valve replacement with bioprosthetic valve 11/24/2012    Class: Chronic  . Amputation, toe, traumatic (HCC) 07/04/2011  . Hematoma of groin 06/19/2011  . Atherosclerosis of native arteries of the extremities with ulceration(440.23) 06/19/2011  . Other complications due to other vascular device, implant, and graft 06/19/2011  . PVD (peripheral vascular disease) with claudication (HCC) 06/05/2011    Palliative Care Assessment & Plan   Patient Profile: 85 y.o. male admitted on 04/24/2018 from home with complaints of increased weakness and several episodes of vomiting. 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 During ED visit wife reported patient does not communicate and requires 24/7 care. She reported patient having multiple episodes of vomiting starting early morning. EMS was called and EKG showed inferior STEMI, and confirmed. Cr 1.66, K 5.6, Head CT showed no acute abnormalities. Cardiology notified and per Dr. Katrinka Blazing patient's primary cardiologist, patient only appropriate for medical management at this time. Since admission he continues on IV heparin. He remains lethargic and does not follow commands. He is unable to take anything by mouth and is receiving ASA via suppository. Palliative Medicine team consulted for goals of care.   Recommendations/Plan:  DNR/DNI  Transition to Full Comfort Measures  Family have decided to transition to comfort and requesting [facility] placement.   CSW referral for [facility]  Will give one dose of Amiodarone for HR management per cardiology and requested by family. Will not initiate drip due to now goal is for comfort and hospice.   Morphine PRN for shortness of breath/pain  Robinul PRN for excessive secretions  Scopolamine patch for excessive secretions  Haldol PRN for agitation  Patient may have comfort feeds.   PMT will continue to support and follow.   Goals of Care and Additional Recommendations:  Limitations on Scope of Treatment: Full Comfort Care  Code Status:    Code Status Orders  (From admission, onward)  Start     Ordered   04/24/18 1750  Do not attempt resuscitation (DNR)  Continuous    Question Answer Comment  In the event of cardiac or respiratory ARREST Do not call a "code blue"   In the event of cardiac or  respiratory ARREST Do not perform Intubation, CPR, defibrillation or ACLS   In the event of cardiac or respiratory ARREST Use medication by any route, position, wound care, and other measures to relive pain and suffering. May use oxygen, suction and manual treatment of airway obstruction as needed for comfort.      04/24/18 1749        Code Status History    Date Active Date Inactive Code Status Order ID Comments User Context   04/24/2018 1633 04/24/2018 1749 DNR [id number]  Manson Passey, PA ED   08/04/2014 1522 08/04/2014 2304 Full Code [id number]  Sherren Kerns, MD Inpatient   06/05/2011 1723 06/13/2011 1648 Full Code [id number]  Ave Filter, RN Inpatient   05/05/2011 1703 05/13/2011 2153 Full Code [id number]  Sebastian Ache, RN Inpatient    Advance Directive Documentation     Most Recent Value  Type of Advance Directive  Out of facility DNR (pink MOST or yellow form), Healthcare Power of Attorney  Pre-existing out of facility DNR order (yellow form or pink MOST form)  -  "MOST" Form in Place?  -     Prognosis:   2 WEEKS OR LESS-in the setting of full comfort care, poor po intake, risk of aspiration, lethargy, deconditioned, advanced dementia, weight loss > 10%, bedbound, tachycardia, STEMI, CAD, hypotension, diabetes, atrial fib with RVR, failure to thrive, and protein calorie malnutrition.   Discharge Planning:  Hospice facility  Care plan was discussed with patient's family (wife, 2 daughters), Dr. Sander Radon, NP (Cardiology), and bedside RN.   Thank you for allowing the Palliative Medicine Team to assist in the care of this patient.   Time In: 1335 Time Out: 1430 Total Time 55 min.  Prolonged Time Billed  YES        Greater than 50%  of this time was spent counseling and coordinating care related to the above assessment and plan.  Willette Alma, AGPCNP-BC Palliative Medicine Team  Pager: [phone] Amion: Thea Alken   Please  contact Palliative Medicine Team phone at [phone] for questions and concerns.

## 2018-04-27 NOTE — Care Management Important Message (Signed)
Important Message  Patient Details  Name: [name]. MRN: [id number] Date of Birth: [birth year]   Medicare Important Message Given:  Yes    Oralia Rud Sakoya Win 04/27/2018, 12:54 PM

## 2018-04-28 DIAGNOSIS — R627 Adult failure to thrive: Secondary | ICD-10-CM

## 2018-04-28 DIAGNOSIS — R400 Somnolence: Secondary | ICD-10-CM

## 2018-04-28 NOTE — Progress Notes (Signed)
Report given to [facility], RN. Pt DC to [facility] via [place]

## 2018-04-28 NOTE — Care Management Note (Signed)
Case Management Note  Patient Details  Name: [name]. MRN: [id number] Date of Birth: 03-20-1924  Subjective/Objective: Pt presented for Stemi- hx of dementia. PTA from home with wife. Patient being followed by Palliative Services.                    Action/Plan: CSW assisting family with Residential Hospice.   Expected Discharge Date:  04/28/18               Expected Discharge Plan:  Hospice Medical Facility  In-House Referral:  Clinical Social Work  Discharge planning Services  CM Consult  Post Acute Care Choice:  NA Choice offered to:  NA  DME Arranged:  N/A DME Agency:  NA  HH Arranged:  NA HH Agency:  NA  Status of Service:  Completed, signed off  If discussed at [employer] of Stay Meetings, dates discussed:    Additional Comments:  Gala Lewandowsky, RN 04/28/2018, 10:31 AM

## 2018-04-28 NOTE — Social Work (Signed)
Patient will discharge to [facility] Anticipated discharge date: 04/28/2018 Family notified: Devell Brinkerhoff, spouse Transportation by: [facility]  Nurse to call report to [phone].  CSW signing off.  Abigail Butts, LCSWA  Clinical Social Worker

## 2018-04-28 NOTE — Progress Notes (Signed)
PALLIATIVE:  Patient in bed, somewhat lethargic this morning. Will not open eyes or respond to name or stimuli. Audible congestion. Wife, family, and Renato Gails at bedside. Patient remains NPO due to lethargy and risk of aspiration. Wife reports attempts to give drops of water for comfort.   Wife expressed all family members are at peace with plan and her main focus is for comfort and for patient not to suffer. Support given. She verbalized understanding of continued care at [facility]. She is appreciative of all support during hospitalization.   Assessment: -lethargic, will not follow commands, audible secretions, rhonchi, S1S2 irregular, tachycardia  Plan -DNR/DNI -Full Comfort -Transferring to [facility] later today  Total Time: 25 min  Greater than 50%  of this time was spent counseling and coordinating care related to the above assessment and plan  Willette Alma, AGPCNP-BC Palliative Medicine Team  Pager: [phone] Amion: N. Cousar

## 2018-05-26 DEATH — deceased

## 2020-03-30 IMAGING — CT CT RENAL STONE PROTOCOL
2 of 4 series · 16 of 46 positions shown, 18 images · non-contrast
Comparison: CT angiogram runoff March 10, 2011

CLINICAL DATA: Hematuria. History of benign prostatic hypertrophy,
colonic polyps, hemorrhoidectomy.

EXAM:
CT ABDOMEN AND PELVIS WITHOUT CONTRAST
TECHNIQUE: Multidetector CT imaging of the abdomen and pelvis was performed
following the standard protocol without IV contrast.

[Series 3: stone study 5.0 i30f 2 · axial · 0.62mm/px · z∈[+894,+1274]mm · 13 of 84 slices shown, 15 images]
[im 4/84  soft-tissue]
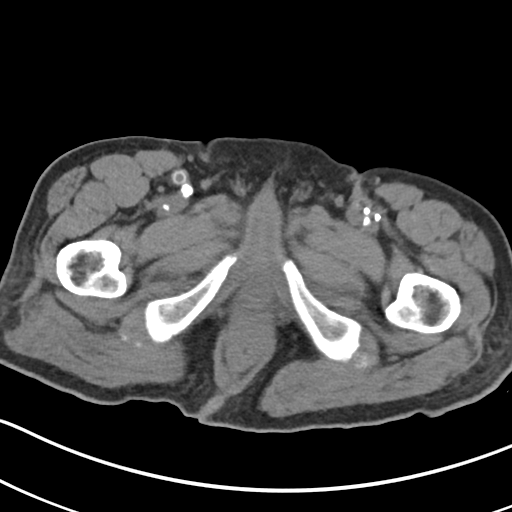
[im 4/84  bone]
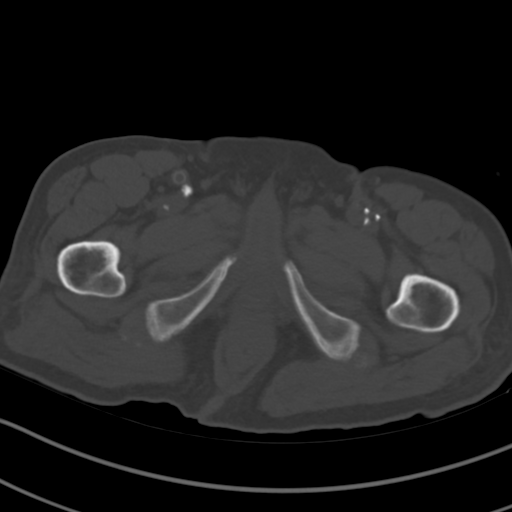
[im 11/84  soft-tissue]
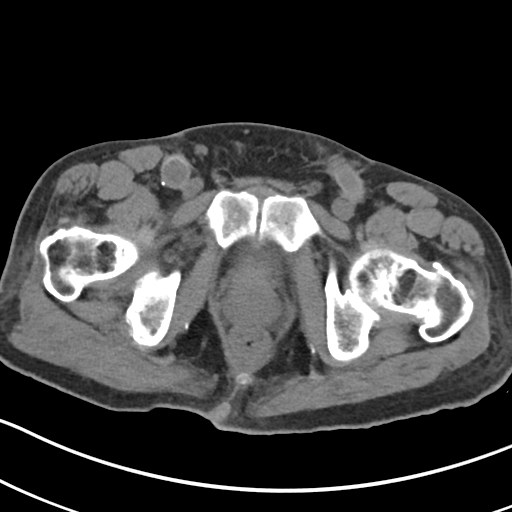
[im 19/84  soft-tissue]
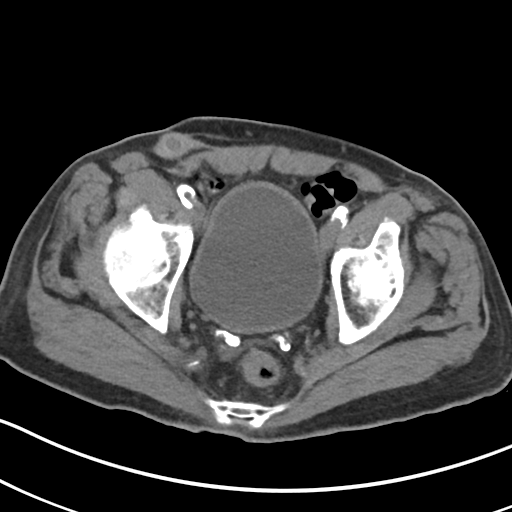
[im 22/84  soft-tissue]
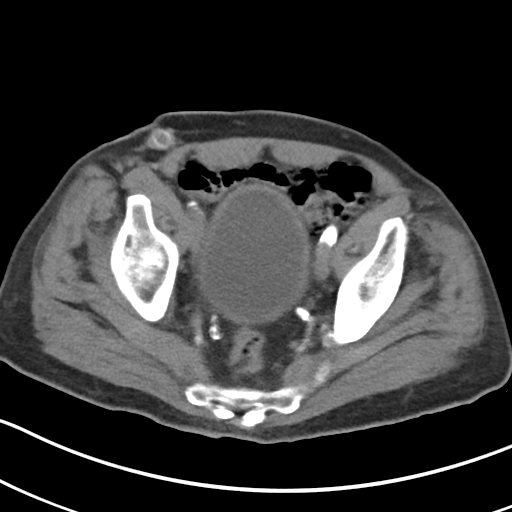
[im 29/84  soft-tissue]
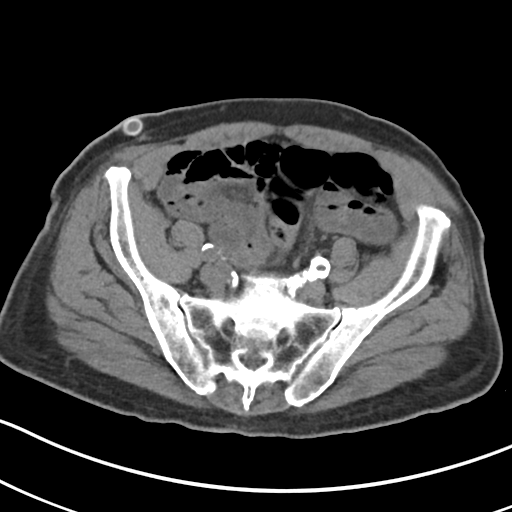
[im 37/84  soft-tissue]
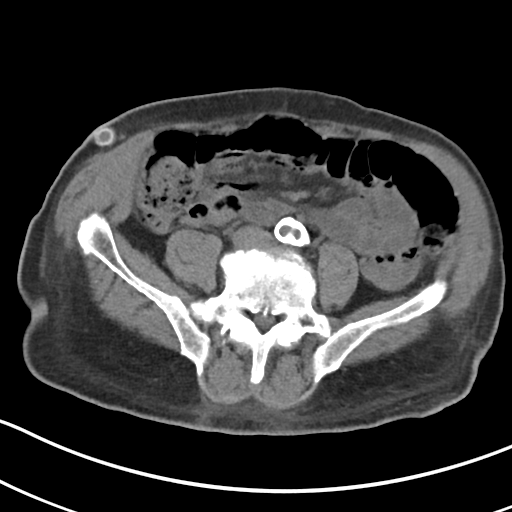
[im 44/84  soft-tissue]
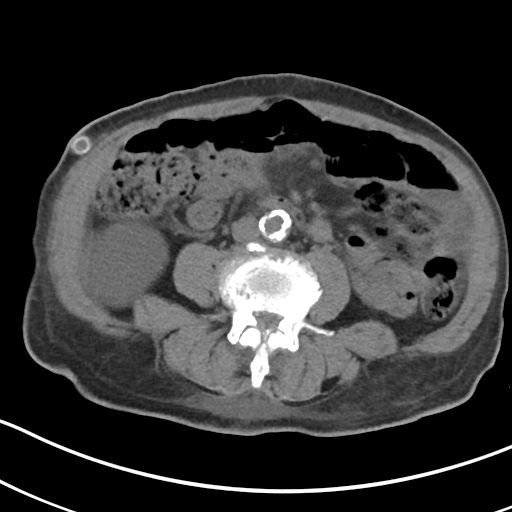
[im 47/84  soft-tissue]
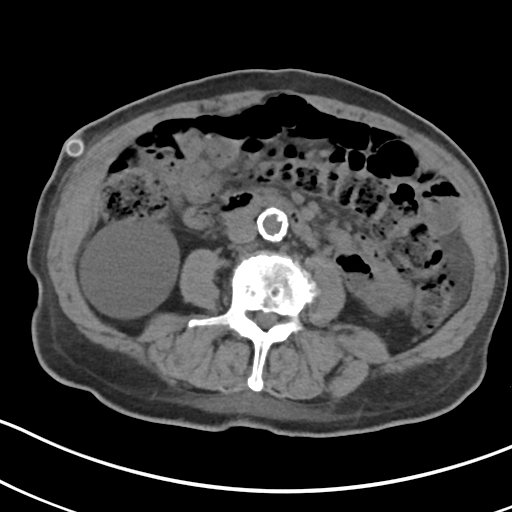
[im 55/84  soft-tissue]
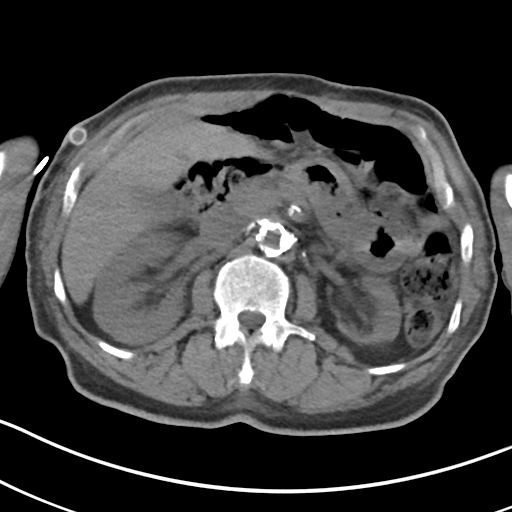
[im 55/84  bone]
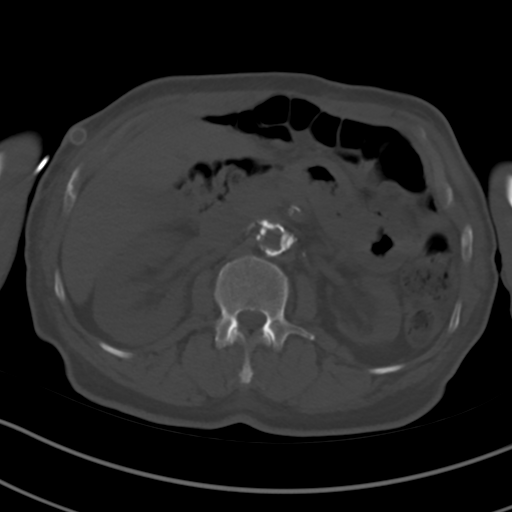
[im 62/84  soft-tissue]
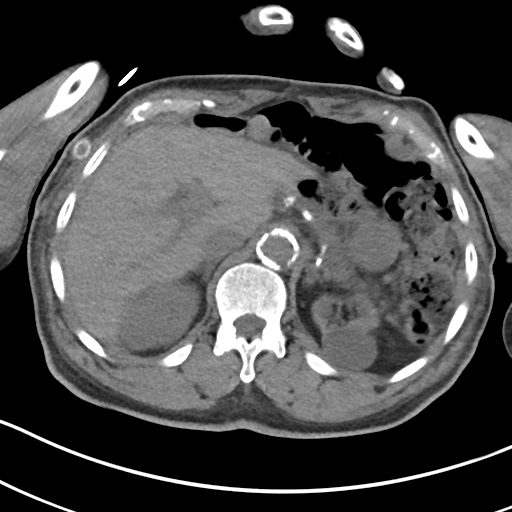
[im 65/84  soft-tissue]
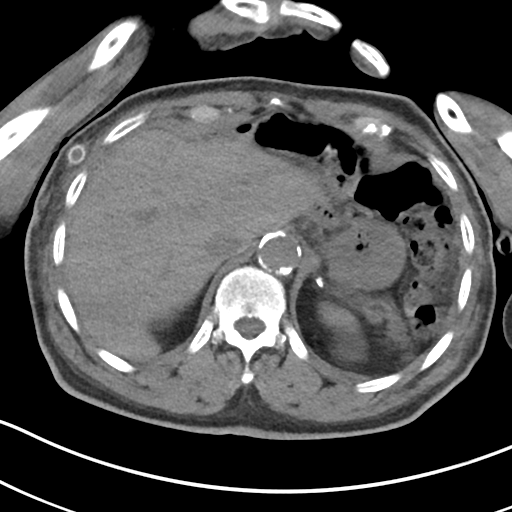
[im 73/84  soft-tissue]
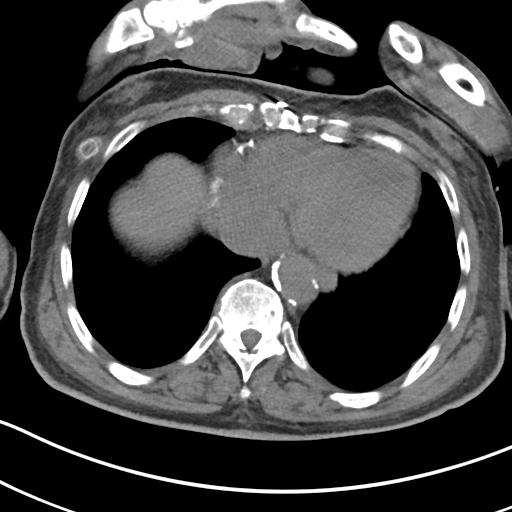
[im 80/84  soft-tissue]
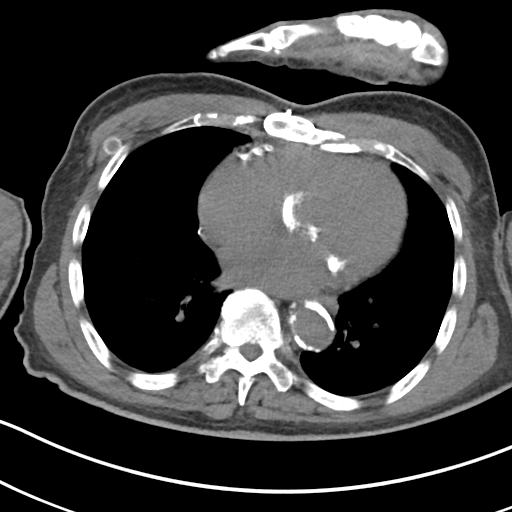

[Series 6: coronal soft tissue · coronal · 0.63mm/px · 3 of 80 slices shown]
[im 27/80  soft-tissue]
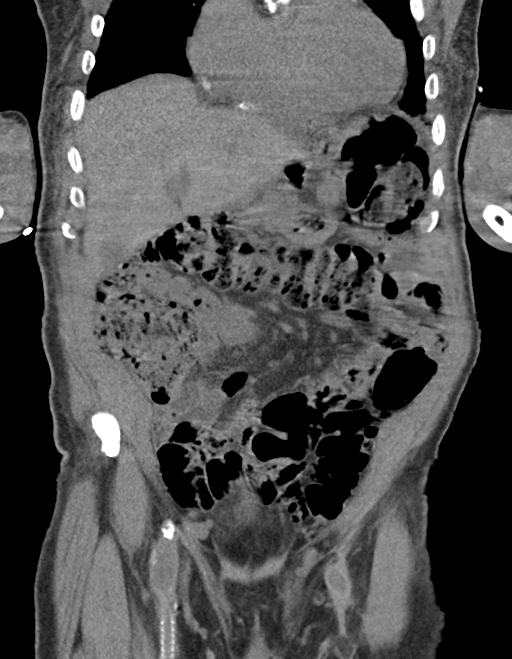
[im 36/80  soft-tissue]
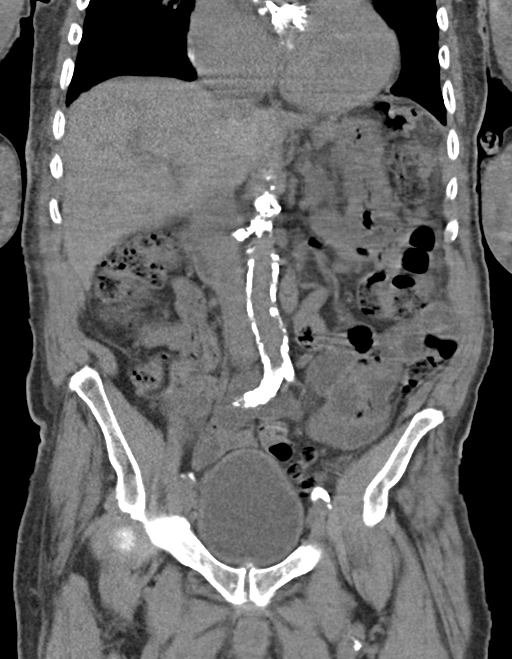
[im 44/80  soft-tissue]
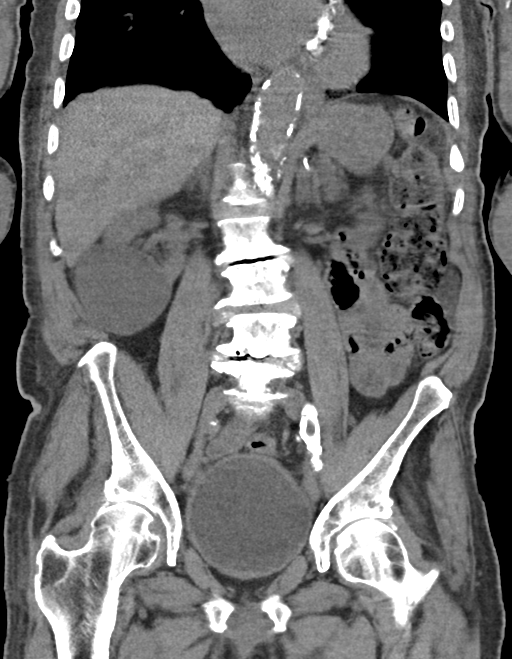

[16 of 46 positions shown; findings below may reference images not displayed]

FINDINGS: LOWER CHEST: Lung bases are clear. The heart is mildly enlarged.
Severe coronary artery calcifications and annular calcifications.

HEPATOBILIARY: Normal.

PANCREAS: Normal.

SPLEEN: Normal.

ADRENALS/URINARY TRACT: Kidneys are orthotopic, atrophic LEFT kidney
similar to prior CT. Mild RIGHT nephromegaly. Bilateral renal cysts
measuring to 6.3 cm on the RIGHT, relatively unchanged. No
nephrolithiasis, hydronephrosis; limited assessment for renal masses
on this nonenhanced examination. The unopacified ureters are normal
in course and caliber. Urinary bladder is well distended with small
fundal diverticulum. Mild urinary bladder wall thickening similar to
prior examination most consistent with chronic outlet obstruction.
Mild cortical thickening of adrenal glands seen with hyperplasia.

STOMACH/BOWEL: The stomach, small and large bowel are normal in
course and caliber without inflammatory changes, sensitivity
decreased by lack of enteric contrast. Mild colonic diverticulosis.
Mild amount of retained large bowel stool. Normal appendix.

VASCULAR/LYMPHATIC: Interval axial femoral bypass with graft in
RIGHT abdominal wall. Severe calcific atherosclerosis aortoiliac
vessels. No lymphadenopathy by CT size criteria.

REPRODUCTIVE: Mild prostatomegaly invading base of bladder.

OTHER: No intraperitoneal free fluid or free air.

MUSCULOSKELETAL: Non-acute.  Severe lumbar spondylosis.
IMPRESSION: 1. No nephrolithiasis or obstructive uropathy. Similarly atrophic
LEFT kidney. Suspected chronic bladder outlet obstruction.
2. Interval axillary-femoral bypass.
